# Patient Record
Sex: Female | Born: 1960 | Hispanic: No | Marital: Married | Smoking: Former smoker
Health system: Southern US, Community
[De-identification: ages and names within clinical notes are randomized; demographics above are authoritative.]

## PROBLEM LIST (undated history)

## (undated) DIAGNOSIS — Z9221 Personal history of antineoplastic chemotherapy: Secondary | ICD-10-CM

## (undated) DIAGNOSIS — R87612 Low grade squamous intraepithelial lesion on cytologic smear of cervix (LGSIL): Secondary | ICD-10-CM

## (undated) DIAGNOSIS — N390 Urinary tract infection, site not specified: Secondary | ICD-10-CM

## (undated) DIAGNOSIS — J302 Other seasonal allergic rhinitis: Secondary | ICD-10-CM

## (undated) DIAGNOSIS — I499 Cardiac arrhythmia, unspecified: Secondary | ICD-10-CM

## (undated) DIAGNOSIS — R7303 Prediabetes: Secondary | ICD-10-CM

## (undated) DIAGNOSIS — C50919 Malignant neoplasm of unspecified site of unspecified female breast: Secondary | ICD-10-CM

## (undated) DIAGNOSIS — E119 Type 2 diabetes mellitus without complications: Secondary | ICD-10-CM

## (undated) DIAGNOSIS — Z923 Personal history of irradiation: Secondary | ICD-10-CM

## (undated) DIAGNOSIS — E785 Hyperlipidemia, unspecified: Secondary | ICD-10-CM

## (undated) HISTORY — DX: Low grade squamous intraepithelial lesion on cytologic smear of cervix (LGSIL): R87.612

## (undated) HISTORY — PX: REDUCTION MAMMAPLASTY: SUR839

## (undated) HISTORY — DX: Urinary tract infection, site not specified: N39.0

## (undated) HISTORY — DX: Prediabetes: R73.03

## (undated) HISTORY — DX: Malignant neoplasm of unspecified site of unspecified female breast: C50.919

## (undated) HISTORY — DX: Hyperlipidemia, unspecified: E78.5

---

## 1993-04-10 HISTORY — PX: LEEP: SHX91

## 2001-01-08 DIAGNOSIS — C50919 Malignant neoplasm of unspecified site of unspecified female breast: Secondary | ICD-10-CM

## 2001-01-08 HISTORY — DX: Malignant neoplasm of unspecified site of unspecified female breast: C50.919

## 2002-01-08 DIAGNOSIS — C50911 Malignant neoplasm of unspecified site of right female breast: Secondary | ICD-10-CM | POA: Insufficient documentation

## 2002-07-09 HISTORY — PX: BREAST BIOPSY: SHX20

## 2002-08-09 HISTORY — PX: BREAST LUMPECTOMY: SHX2

## 2003-10-09 ENCOUNTER — Ambulatory Visit: Payer: Self-pay | Admitting: Oncology

## 2003-11-09 ENCOUNTER — Ambulatory Visit: Payer: Self-pay | Admitting: Oncology

## 2003-12-09 ENCOUNTER — Ambulatory Visit: Payer: Self-pay | Admitting: Oncology

## 2003-12-13 ENCOUNTER — Ambulatory Visit: Payer: Self-pay | Admitting: General Surgery

## 2004-01-09 ENCOUNTER — Ambulatory Visit: Payer: Self-pay | Admitting: Oncology

## 2004-02-09 ENCOUNTER — Ambulatory Visit: Payer: Self-pay | Admitting: Oncology

## 2004-03-08 ENCOUNTER — Ambulatory Visit: Payer: Self-pay | Admitting: Oncology

## 2004-04-08 ENCOUNTER — Ambulatory Visit: Payer: Self-pay | Admitting: Oncology

## 2004-05-08 ENCOUNTER — Ambulatory Visit: Payer: Self-pay | Admitting: Oncology

## 2004-06-08 ENCOUNTER — Ambulatory Visit: Payer: Self-pay | Admitting: Oncology

## 2004-06-12 ENCOUNTER — Ambulatory Visit: Payer: Self-pay | Admitting: General Surgery

## 2004-07-12 ENCOUNTER — Ambulatory Visit: Payer: Self-pay | Admitting: Oncology

## 2004-08-08 ENCOUNTER — Ambulatory Visit: Payer: Self-pay | Admitting: Oncology

## 2004-10-11 ENCOUNTER — Ambulatory Visit: Payer: Self-pay | Admitting: Oncology

## 2004-11-08 ENCOUNTER — Ambulatory Visit: Payer: Self-pay | Admitting: Oncology

## 2005-02-15 ENCOUNTER — Ambulatory Visit: Payer: Self-pay | Admitting: Oncology

## 2005-03-08 ENCOUNTER — Ambulatory Visit: Payer: Self-pay | Admitting: Oncology

## 2005-06-08 ENCOUNTER — Ambulatory Visit: Payer: Self-pay | Admitting: General Surgery

## 2005-06-15 ENCOUNTER — Ambulatory Visit: Payer: Self-pay | Admitting: Oncology

## 2005-07-08 ENCOUNTER — Ambulatory Visit: Payer: Self-pay | Admitting: Oncology

## 2005-10-12 ENCOUNTER — Ambulatory Visit: Payer: Self-pay | Admitting: Oncology

## 2005-11-08 ENCOUNTER — Ambulatory Visit: Payer: Self-pay | Admitting: Oncology

## 2006-02-08 ENCOUNTER — Ambulatory Visit: Payer: Self-pay | Admitting: Oncology

## 2006-03-09 ENCOUNTER — Ambulatory Visit: Payer: Self-pay | Admitting: Oncology

## 2006-06-20 ENCOUNTER — Ambulatory Visit: Payer: Self-pay | Admitting: Oncology

## 2006-08-07 ENCOUNTER — Ambulatory Visit: Payer: Self-pay | Admitting: Oncology

## 2006-08-09 ENCOUNTER — Ambulatory Visit: Payer: Self-pay | Admitting: Oncology

## 2007-01-09 ENCOUNTER — Ambulatory Visit: Payer: Self-pay | Admitting: Oncology

## 2007-02-06 ENCOUNTER — Ambulatory Visit: Payer: Self-pay | Admitting: Oncology

## 2007-02-09 ENCOUNTER — Ambulatory Visit: Payer: Self-pay | Admitting: Oncology

## 2007-03-09 ENCOUNTER — Ambulatory Visit: Payer: Self-pay | Admitting: Oncology

## 2007-05-09 ENCOUNTER — Ambulatory Visit: Payer: Self-pay | Admitting: Oncology

## 2007-06-09 ENCOUNTER — Ambulatory Visit: Payer: Self-pay | Admitting: Oncology

## 2007-06-23 ENCOUNTER — Ambulatory Visit: Payer: Self-pay | Admitting: Oncology

## 2007-11-09 ENCOUNTER — Ambulatory Visit: Payer: Self-pay | Admitting: Oncology

## 2007-11-19 ENCOUNTER — Ambulatory Visit: Payer: Self-pay | Admitting: Oncology

## 2007-12-09 ENCOUNTER — Ambulatory Visit: Payer: Self-pay | Admitting: Oncology

## 2008-01-09 HISTORY — PX: BREAST BIOPSY: SHX20

## 2008-03-08 ENCOUNTER — Ambulatory Visit: Payer: Self-pay | Admitting: Oncology

## 2008-04-07 ENCOUNTER — Ambulatory Visit: Payer: Self-pay | Admitting: Oncology

## 2008-04-08 ENCOUNTER — Ambulatory Visit: Payer: Self-pay | Admitting: Oncology

## 2008-06-29 ENCOUNTER — Ambulatory Visit: Payer: Self-pay | Admitting: Oncology

## 2008-06-30 ENCOUNTER — Ambulatory Visit: Payer: Self-pay | Admitting: Oncology

## 2008-07-08 ENCOUNTER — Ambulatory Visit: Payer: Self-pay | Admitting: Oncology

## 2008-07-09 ENCOUNTER — Ambulatory Visit: Payer: Self-pay | Admitting: Oncology

## 2008-07-21 ENCOUNTER — Ambulatory Visit: Payer: Self-pay | Admitting: General Surgery

## 2008-08-08 ENCOUNTER — Ambulatory Visit: Payer: Self-pay | Admitting: Oncology

## 2009-07-08 ENCOUNTER — Ambulatory Visit: Payer: Self-pay | Admitting: Oncology

## 2009-07-22 ENCOUNTER — Ambulatory Visit: Payer: Self-pay | Admitting: Oncology

## 2009-08-08 ENCOUNTER — Ambulatory Visit: Payer: Self-pay | Admitting: Oncology

## 2009-09-08 ENCOUNTER — Ambulatory Visit: Payer: Self-pay | Admitting: Oncology

## 2010-01-23 ENCOUNTER — Ambulatory Visit: Payer: Self-pay | Admitting: Oncology

## 2010-01-24 LAB — CANCER ANTIGEN 27.29: CA 27.29: 18.7 U/mL (ref 0.0–38.6)

## 2010-02-08 ENCOUNTER — Ambulatory Visit: Payer: Self-pay | Admitting: Oncology

## 2010-03-31 ENCOUNTER — Ambulatory Visit: Payer: Self-pay | Admitting: General Surgery

## 2010-03-31 HISTORY — PX: COLONOSCOPY: SHX174

## 2010-04-03 LAB — PATHOLOGY REPORT

## 2010-08-11 ENCOUNTER — Ambulatory Visit: Payer: Self-pay | Admitting: Oncology

## 2010-08-12 LAB — CANCER ANTIGEN 27.29: CA 27.29: 11.7 U/mL (ref 0.0–38.6)

## 2010-09-09 ENCOUNTER — Ambulatory Visit: Payer: Self-pay | Admitting: Oncology

## 2010-09-14 ENCOUNTER — Ambulatory Visit: Payer: Self-pay | Admitting: Oncology

## 2010-10-09 ENCOUNTER — Ambulatory Visit: Payer: Self-pay | Admitting: Oncology

## 2011-02-12 ENCOUNTER — Ambulatory Visit: Payer: Self-pay | Admitting: Oncology

## 2011-02-12 LAB — CBC CANCER CENTER
Basophil #: 0.1 x10 3/mm (ref 0.0–0.1)
Basophil %: 0.5 %
Eosinophil #: 0.1 x10 3/mm (ref 0.0–0.7)
Eosinophil %: 1 %
HCT: 41 % (ref 35.0–47.0)
HGB: 13.9 g/dL (ref 12.0–16.0)
Lymphocyte #: 2.2 x10 3/mm (ref 1.0–3.6)
Lymphocyte %: 23.6 %
MCH: 30.2 pg (ref 26.0–34.0)
MCHC: 34.1 g/dL (ref 32.0–36.0)
MCV: 89 fL (ref 80–100)
Monocyte #: 0.4 x10 3/mm (ref 0.0–0.7)
Monocyte %: 4 %
Neutrophil #: 6.6 x10 3/mm — ABNORMAL HIGH (ref 1.4–6.5)
Neutrophil %: 70.9 %
Platelet: 265 x10 3/mm (ref 150–440)
RBC: 4.63 10*6/uL (ref 3.80–5.20)
RDW: 14.3 % (ref 11.5–14.5)
WBC: 9.3 x10 3/mm (ref 3.6–11.0)

## 2011-02-12 LAB — COMPREHENSIVE METABOLIC PANEL
Albumin: 4 g/dL (ref 3.4–5.0)
Alkaline Phosphatase: 126 U/L (ref 50–136)
Anion Gap: 9 (ref 7–16)
BUN: 12 mg/dL (ref 7–18)
Bilirubin,Total: 0.3 mg/dL (ref 0.2–1.0)
Calcium, Total: 9.6 mg/dL (ref 8.5–10.1)
Chloride: 102 mmol/L (ref 98–107)
Co2: 30 mmol/L (ref 21–32)
Creatinine: 0.99 mg/dL (ref 0.60–1.30)
EGFR (African American): >60
EGFR (Non-African Amer.): >60
Glucose: 93 mg/dL (ref 65–99)
Osmolality: 281 (ref 275–301)
Potassium: 4 mmol/L (ref 3.5–5.1)
SGOT(AST): 26 U/L (ref 15–37)
SGPT (ALT): 45 U/L
Sodium: 141 mmol/L (ref 136–145)
Total Protein: 8.5 g/dL — ABNORMAL HIGH (ref 6.4–8.2)

## 2011-02-13 LAB — CANCER ANTIGEN 27.29: CA 27.29: 18.5 U/mL (ref 0.0–38.6)

## 2011-03-09 ENCOUNTER — Ambulatory Visit: Payer: Self-pay | Admitting: Oncology

## 2011-08-14 ENCOUNTER — Ambulatory Visit: Payer: Self-pay | Admitting: Oncology

## 2011-08-14 LAB — COMPREHENSIVE METABOLIC PANEL
Albumin: 3.8 g/dL (ref 3.4–5.0)
Alkaline Phosphatase: 118 U/L (ref 50–136)
Anion Gap: 7 (ref 7–16)
BUN: 12 mg/dL (ref 7–18)
Bilirubin,Total: 0.4 mg/dL (ref 0.2–1.0)
Calcium, Total: 9.7 mg/dL (ref 8.5–10.1)
Chloride: 102 mmol/L (ref 98–107)
Co2: 31 mmol/L (ref 21–32)
Creatinine: 1.01 mg/dL (ref 0.60–1.30)
EGFR (African American): >60
EGFR (Non-African Amer.): >60
Glucose: 137 mg/dL — ABNORMAL HIGH (ref 65–99)
Osmolality: 281 (ref 275–301)
Potassium: 3.9 mmol/L (ref 3.5–5.1)
SGOT(AST): 24 U/L (ref 15–37)
SGPT (ALT): 44 U/L (ref 12–78)
Sodium: 140 mmol/L (ref 136–145)
Total Protein: 8.2 g/dL (ref 6.4–8.2)

## 2011-08-14 LAB — CBC CANCER CENTER
Basophil #: 0.1 x10 3/mm (ref 0.0–0.1)
Basophil %: 0.7 %
Eosinophil #: 0.2 x10 3/mm (ref 0.0–0.7)
Eosinophil %: 1.7 %
HCT: 39.8 % (ref 35.0–47.0)
HGB: 13.4 g/dL (ref 12.0–16.0)
Lymphocyte #: 2.2 x10 3/mm (ref 1.0–3.6)
Lymphocyte %: 23.5 %
MCH: 30.2 pg (ref 26.0–34.0)
MCHC: 33.7 g/dL (ref 32.0–36.0)
MCV: 90 fL (ref 80–100)
Monocyte #: 0.4 x10 3/mm (ref 0.2–0.9)
Monocyte %: 4.8 %
Neutrophil #: 6.5 x10 3/mm (ref 1.4–6.5)
Neutrophil %: 69.3 %
Platelet: 275 x10 3/mm (ref 150–440)
RBC: 4.43 10*6/uL (ref 3.80–5.20)
RDW: 13.9 % (ref 11.5–14.5)
WBC: 9.4 x10 3/mm (ref 3.6–11.0)

## 2011-08-15 LAB — CANCER ANTIGEN 27.29: CA 27.29: 15.3 U/mL (ref 0.0–38.6)

## 2011-09-09 ENCOUNTER — Ambulatory Visit: Payer: Self-pay | Admitting: Oncology

## 2011-10-09 ENCOUNTER — Ambulatory Visit: Payer: Self-pay | Admitting: Oncology

## 2012-02-09 ENCOUNTER — Ambulatory Visit: Payer: Self-pay | Admitting: Oncology

## 2012-02-14 LAB — CBC CANCER CENTER
Basophil #: 0.1 x10 3/mm (ref 0.0–0.1)
Basophil %: 0.7 %
Eosinophil #: 0.2 x10 3/mm (ref 0.0–0.7)
Eosinophil %: 1.9 %
HCT: 40.9 % (ref 35.0–47.0)
HGB: 13.9 g/dL (ref 12.0–16.0)
Lymphocyte #: 2.1 x10 3/mm (ref 1.0–3.6)
Lymphocyte %: 23.4 %
MCH: 30.3 pg (ref 26.0–34.0)
MCHC: 33.9 g/dL (ref 32.0–36.0)
MCV: 89 fL (ref 80–100)
Monocyte #: 0.4 x10 3/mm (ref 0.2–0.9)
Monocyte %: 5 %
Neutrophil #: 6.1 x10 3/mm (ref 1.4–6.5)
Neutrophil %: 69 %
Platelet: 265 x10 3/mm (ref 150–440)
RBC: 4.58 10*6/uL (ref 3.80–5.20)
RDW: 13.6 % (ref 11.5–14.5)
WBC: 8.8 x10 3/mm (ref 3.6–11.0)

## 2012-02-14 LAB — COMPREHENSIVE METABOLIC PANEL
Albumin: 3.9 g/dL (ref 3.4–5.0)
Alkaline Phosphatase: 106 U/L (ref 50–136)
Anion Gap: 9 (ref 7–16)
BUN: 9 mg/dL (ref 7–18)
Bilirubin,Total: 0.3 mg/dL (ref 0.2–1.0)
Calcium, Total: 9.8 mg/dL (ref 8.5–10.1)
Chloride: 103 mmol/L (ref 98–107)
Co2: 30 mmol/L (ref 21–32)
Creatinine: 0.86 mg/dL (ref 0.60–1.30)
EGFR (African American): >60
EGFR (Non-African Amer.): >60
Glucose: 87 mg/dL (ref 65–99)
Osmolality: 281 (ref 275–301)
Potassium: 3.9 mmol/L (ref 3.5–5.1)
SGOT(AST): 19 U/L (ref 15–37)
SGPT (ALT): 33 U/L (ref 12–78)
Sodium: 142 mmol/L (ref 136–145)
Total Protein: 8.4 g/dL — ABNORMAL HIGH (ref 6.4–8.2)

## 2012-02-15 LAB — CANCER ANTIGEN 27.29: CA 27.29: 16.2 U/mL (ref 0.0–38.6)

## 2012-03-08 ENCOUNTER — Ambulatory Visit: Payer: Self-pay | Admitting: Oncology

## 2012-09-17 ENCOUNTER — Ambulatory Visit: Payer: Self-pay | Admitting: Oncology

## 2012-09-18 ENCOUNTER — Ambulatory Visit: Payer: Self-pay | Admitting: Oncology

## 2012-09-18 LAB — COMPREHENSIVE METABOLIC PANEL
Albumin: 3.9 g/dL (ref 3.4–5.0)
Alkaline Phosphatase: 113 U/L (ref 50–136)
Anion Gap: 12 (ref 7–16)
BUN: 12 mg/dL (ref 7–18)
Bilirubin,Total: 0.3 mg/dL (ref 0.2–1.0)
Calcium, Total: 10.1 mg/dL (ref 8.5–10.1)
Chloride: 101 mmol/L (ref 98–107)
Co2: 28 mmol/L (ref 21–32)
Creatinine: 1.05 mg/dL (ref 0.60–1.30)
EGFR (African American): >60
EGFR (Non-African Amer.): >60
Glucose: 170 mg/dL — ABNORMAL HIGH (ref 65–99)
Osmolality: 285 (ref 275–301)
Potassium: 4 mmol/L (ref 3.5–5.1)
SGOT(AST): 16 U/L (ref 15–37)
SGPT (ALT): 33 U/L (ref 12–78)
Sodium: 141 mmol/L (ref 136–145)
Total Protein: 8.2 g/dL (ref 6.4–8.2)

## 2012-09-18 LAB — CBC CANCER CENTER
Basophil #: 0.1 x10 3/mm (ref 0.0–0.1)
Basophil %: 0.6 %
Eosinophil #: 0.2 x10 3/mm (ref 0.0–0.7)
Eosinophil %: 2 %
HCT: 42.5 % (ref 35.0–47.0)
HGB: 14.2 g/dL (ref 12.0–16.0)
Lymphocyte #: 2 x10 3/mm (ref 1.0–3.6)
Lymphocyte %: 22.6 %
MCH: 29.7 pg (ref 26.0–34.0)
MCHC: 33.4 g/dL (ref 32.0–36.0)
MCV: 89 fL (ref 80–100)
Monocyte #: 0.5 x10 3/mm (ref 0.2–0.9)
Monocyte %: 5.6 %
Neutrophil #: 6.2 x10 3/mm (ref 1.4–6.5)
Neutrophil %: 69.2 %
Platelet: 288 x10 3/mm (ref 150–440)
RBC: 4.78 10*6/uL (ref 3.80–5.20)
RDW: 14 % (ref 11.5–14.5)
WBC: 9 x10 3/mm (ref 3.6–11.0)

## 2012-10-08 ENCOUNTER — Ambulatory Visit: Payer: Self-pay | Admitting: Oncology

## 2013-03-31 ENCOUNTER — Ambulatory Visit: Payer: Self-pay | Admitting: Oncology

## 2013-04-13 ENCOUNTER — Ambulatory Visit: Payer: Self-pay | Admitting: Oncology

## 2013-04-14 LAB — COMPREHENSIVE METABOLIC PANEL
Albumin: 3.9 g/dL (ref 3.4–5.0)
Alkaline Phosphatase: 94 U/L
Anion Gap: 14 (ref 7–16)
BUN: 15 mg/dL (ref 7–18)
Bilirubin,Total: 0.3 mg/dL (ref 0.2–1.0)
Calcium, Total: 9.8 mg/dL (ref 8.5–10.1)
Chloride: 101 mmol/L (ref 98–107)
Co2: 24 mmol/L (ref 21–32)
Creatinine: 1.11 mg/dL (ref 0.60–1.30)
EGFR (African American): >60
EGFR (Non-African Amer.): 57 — ABNORMAL LOW
Glucose: 197 mg/dL — ABNORMAL HIGH (ref 65–99)
Osmolality: 284 (ref 275–301)
Potassium: 3.6 mmol/L (ref 3.5–5.1)
SGOT(AST): 22 U/L (ref 15–37)
SGPT (ALT): 37 U/L (ref 12–78)
Sodium: 139 mmol/L (ref 136–145)
Total Protein: 8.4 g/dL — ABNORMAL HIGH (ref 6.4–8.2)

## 2013-04-14 LAB — CBC CANCER CENTER
Basophil #: 0.1 x10 3/mm (ref 0.0–0.1)
Basophil %: 0.7 %
Eosinophil #: 0.2 x10 3/mm (ref 0.0–0.7)
Eosinophil %: 1.9 %
HCT: 42.1 % (ref 35.0–47.0)
HGB: 13.7 g/dL (ref 12.0–16.0)
Lymphocyte #: 2.4 x10 3/mm (ref 1.0–3.6)
Lymphocyte %: 23 %
MCH: 29.4 pg (ref 26.0–34.0)
MCHC: 32.6 g/dL (ref 32.0–36.0)
MCV: 90 fL (ref 80–100)
Monocyte #: 0.4 x10 3/mm (ref 0.2–0.9)
Monocyte %: 4.1 %
Neutrophil #: 7.4 x10 3/mm — ABNORMAL HIGH (ref 1.4–6.5)
Neutrophil %: 70.3 %
Platelet: 293 x10 3/mm (ref 150–440)
RBC: 4.67 10*6/uL (ref 3.80–5.20)
RDW: 14.1 % (ref 11.5–14.5)
WBC: 10.5 x10 3/mm (ref 3.6–11.0)

## 2013-04-16 LAB — CANCER ANTIGEN 27.29: CA 27.29: 16 U/mL (ref 0.0–38.6)

## 2013-05-08 ENCOUNTER — Ambulatory Visit: Payer: Self-pay | Admitting: Oncology

## 2013-06-08 ENCOUNTER — Ambulatory Visit: Payer: Self-pay | Admitting: Oncology

## 2013-09-18 ENCOUNTER — Ambulatory Visit: Payer: Self-pay | Admitting: Oncology

## 2013-11-23 ENCOUNTER — Ambulatory Visit: Payer: Self-pay | Admitting: Oncology

## 2013-11-23 LAB — COMPREHENSIVE METABOLIC PANEL
Albumin: 3.8 g/dL (ref 3.4–5.0)
Alkaline Phosphatase: 104 U/L
Anion Gap: 11 (ref 7–16)
BUN: 10 mg/dL (ref 7–18)
Bilirubin,Total: 0.3 mg/dL (ref 0.2–1.0)
Calcium, Total: 9.6 mg/dL (ref 8.5–10.1)
Chloride: 106 mmol/L (ref 98–107)
Co2: 24 mmol/L (ref 21–32)
Creatinine: 0.91 mg/dL (ref 0.60–1.30)
EGFR (African American): >60
EGFR (Non-African Amer.): >60
Glucose: 92 mg/dL (ref 65–99)
Osmolality: 280 (ref 275–301)
Potassium: 3.6 mmol/L (ref 3.5–5.1)
SGOT(AST): 17 U/L (ref 15–37)
SGPT (ALT): 38 U/L
Sodium: 141 mmol/L (ref 136–145)
Total Protein: 7.9 g/dL (ref 6.4–8.2)

## 2013-11-23 LAB — CBC CANCER CENTER
Basophil #: 0.1 x10 3/mm (ref 0.0–0.1)
Basophil %: 0.6 %
Eosinophil #: 0.2 x10 3/mm (ref 0.0–0.7)
Eosinophil %: 1.3 %
HCT: 41.6 % (ref 35.0–47.0)
HGB: 13.7 g/dL (ref 12.0–16.0)
Lymphocyte #: 2.7 x10 3/mm (ref 1.0–3.6)
Lymphocyte %: 23.4 %
MCH: 29.9 pg (ref 26.0–34.0)
MCHC: 33 g/dL (ref 32.0–36.0)
MCV: 91 fL (ref 80–100)
Monocyte #: 0.6 x10 3/mm (ref 0.2–0.9)
Monocyte %: 4.9 %
Neutrophil #: 8 x10 3/mm — ABNORMAL HIGH (ref 1.4–6.5)
Neutrophil %: 69.8 %
Platelet: 290 x10 3/mm (ref 150–440)
RBC: 4.59 10*6/uL (ref 3.80–5.20)
RDW: 14.2 % (ref 11.5–14.5)
WBC: 11.4 x10 3/mm — ABNORMAL HIGH (ref 3.6–11.0)

## 2013-11-23 LAB — CREATININE, SERUM: Creatine, Serum: 0.91

## 2013-11-25 LAB — CANCER ANTIGEN 27.29: CA 27.29: 9.2 U/mL (ref 0.0–38.6)

## 2013-12-08 ENCOUNTER — Ambulatory Visit: Payer: Self-pay | Admitting: Oncology

## 2014-04-23 ENCOUNTER — Other Ambulatory Visit: Payer: Self-pay | Admitting: Oncology

## 2014-04-23 DIAGNOSIS — Z1231 Encounter for screening mammogram for malignant neoplasm of breast: Secondary | ICD-10-CM

## 2014-05-21 ENCOUNTER — Other Ambulatory Visit: Payer: Self-pay | Admitting: *Deleted

## 2014-05-21 DIAGNOSIS — C50919 Malignant neoplasm of unspecified site of unspecified female breast: Secondary | ICD-10-CM

## 2014-05-27 ENCOUNTER — Ambulatory Visit: Payer: Self-pay | Admitting: Oncology

## 2014-05-27 ENCOUNTER — Other Ambulatory Visit: Payer: Self-pay

## 2014-06-30 ENCOUNTER — Ambulatory Visit: Payer: Self-pay | Admitting: Oncology

## 2014-06-30 ENCOUNTER — Other Ambulatory Visit: Payer: Self-pay

## 2014-07-28 ENCOUNTER — Inpatient Hospital Stay: Payer: BLUE CROSS/BLUE SHIELD | Admitting: Oncology

## 2014-07-28 ENCOUNTER — Inpatient Hospital Stay: Payer: BLUE CROSS/BLUE SHIELD

## 2014-09-20 ENCOUNTER — Ambulatory Visit
Admission: RE | Admit: 2014-09-20 | Discharge: 2014-09-20 | Disposition: A | Discharge status: Home / Self Care | Payer: 59 | Source: Ambulatory Visit | Attending: Oncology | Admitting: Oncology

## 2014-09-20 DIAGNOSIS — Z853 Personal history of malignant neoplasm of breast: Secondary | ICD-10-CM | POA: Diagnosis not present

## 2014-09-20 DIAGNOSIS — Z1231 Encounter for screening mammogram for malignant neoplasm of breast: Secondary | ICD-10-CM | POA: Diagnosis not present

## 2014-09-22 ENCOUNTER — Encounter: Payer: Self-pay | Admitting: Oncology

## 2014-09-22 ENCOUNTER — Inpatient Hospital Stay (HOSPITAL_BASED_OUTPATIENT_CLINIC_OR_DEPARTMENT_OTHER): Payer: 59 | Admitting: Oncology

## 2014-09-22 ENCOUNTER — Inpatient Hospital Stay: Payer: 59 | Attending: Oncology

## 2014-09-22 ENCOUNTER — Encounter (INDEPENDENT_AMBULATORY_CARE_PROVIDER_SITE_OTHER): Payer: Self-pay

## 2014-09-22 VITALS — BP 131/89 | HR 123 | Temp 98.4°F | Wt 185.2 lb

## 2014-09-22 DIAGNOSIS — Z9221 Personal history of antineoplastic chemotherapy: Secondary | ICD-10-CM

## 2014-09-22 DIAGNOSIS — Z803 Family history of malignant neoplasm of breast: Secondary | ICD-10-CM

## 2014-09-22 DIAGNOSIS — Z87891 Personal history of nicotine dependence: Secondary | ICD-10-CM | POA: Diagnosis not present

## 2014-09-22 DIAGNOSIS — Z923 Personal history of irradiation: Secondary | ICD-10-CM

## 2014-09-22 DIAGNOSIS — Z853 Personal history of malignant neoplasm of breast: Secondary | ICD-10-CM | POA: Insufficient documentation

## 2014-09-22 DIAGNOSIS — C50919 Malignant neoplasm of unspecified site of unspecified female breast: Secondary | ICD-10-CM

## 2014-09-22 LAB — CBC WITH DIFFERENTIAL/PLATELET
Basophils Absolute: 0 10*3/uL (ref 0–0.1)
Basophils Relative: 1 %
Eosinophils Absolute: 0.1 10*3/uL (ref 0–0.7)
Eosinophils Relative: 2 %
HCT: 41.6 % (ref 35.0–47.0)
Hemoglobin: 13.6 g/dL (ref 12.0–16.0)
Lymphocytes Relative: 22 %
Lymphs Abs: 1.8 10*3/uL (ref 1.0–3.6)
MCH: 29.1 pg (ref 26.0–34.0)
MCHC: 32.7 g/dL (ref 32.0–36.0)
MCV: 88.9 fL (ref 80.0–100.0)
Monocytes Absolute: 0.4 10*3/uL (ref 0.2–0.9)
Monocytes Relative: 5 %
Neutro Abs: 5.8 10*3/uL (ref 1.4–6.5)
Neutrophils Relative %: 70 %
Platelets: 282 10*3/uL (ref 150–440)
RBC: 4.68 MIL/uL (ref 3.80–5.20)
RDW: 14.2 % (ref 11.5–14.5)
WBC: 8.3 10*3/uL (ref 3.6–11.0)

## 2014-09-22 LAB — COMPREHENSIVE METABOLIC PANEL
ALT: 35 U/L (ref 14–54)
AST: 33 U/L (ref 15–41)
Albumin: 4.2 g/dL (ref 3.5–5.0)
Alkaline Phosphatase: 80 U/L (ref 38–126)
Anion gap: 9 (ref 5–15)
BUN: 11 mg/dL (ref 6–20)
CO2: 29 mmol/L (ref 22–32)
Calcium: 10 mg/dL (ref 8.9–10.3)
Chloride: 103 mmol/L (ref 101–111)
Creatinine, Ser: 0.87 mg/dL (ref 0.44–1.00)
GFR calc Af Amer: >60 mL/min (ref >60)
GFR calc non Af Amer: >60 mL/min (ref >60)
Glucose, Bld: 108 mg/dL — ABNORMAL HIGH (ref 65–99)
Potassium: 3.8 mmol/L (ref 3.5–5.1)
Sodium: 141 mmol/L (ref 135–145)
Total Bilirubin: 0.7 mg/dL (ref 0.3–1.2)
Total Protein: 8.3 g/dL — ABNORMAL HIGH (ref 6.5–8.1)

## 2014-09-22 LAB — IRON AND TIBC
Iron: 61 ug/dL (ref 28–170)
Saturation Ratios: 17 % (ref 10.4–31.8)
TIBC: 355 ug/dL (ref 250–450)
UIBC: 294 ug/dL

## 2014-09-22 LAB — TSH: TSH: 1.153 u[IU]/mL (ref 0.350–4.500)

## 2014-09-22 LAB — FERRITIN: Ferritin: 88 ng/mL (ref 11–307)

## 2014-09-22 NOTE — Progress Notes (Signed)
[place] @ [facility] Telephone:(336) [phone]  Fax:(336) [place]: 05-01-1960  MR#: [id number]  JYN#:[id number]  Patient Care Team: No Pcp Per Patient as PCP - General (General Practice)  CHIEF COMPLAINT: No chief complaint on file.  Chief Complaint/Diagnosis:   1. Carcinoma of right breast diagnosed July 2004.  ER PR positive.  HER-2 3+. 2. Neoadjuvant chemotherapy with Cytoxan Adriamycin and Taxol. Lumpectomy followed by radiation in March of 2005.  3. Started on tamoxifen in February 2005. 4. Received Herceptin from May 2005 to May 2006. 5. Finished tamoxifen in no February of 2010 6. Started on extended adjuvant therapy with Lupron and Femara from March of 2010 7. Patient is on Femara.   Femara has been discontinued in April of 2015 8.BRCA mutation negative (April, 2015 )  No history exists.   INTERVAL HISTORY: 54 year old lady came today further follow-up regarding carcinoma breast. Patient got mammogram done a few days ago which has been reported to be BI-RADS 2. Patient is off letrozole. No abdominal pain no nausea no vomiting.  REVIEW OF SYSTEMS:   GENERAL:  Feels good.  Active.  No fevers, sweats or weight loss. PERFORMANCE STATUS (ECOG):0 HEENT:  No visual changes, runny nose, sore throat, mouth sores or tenderness. Lungs: No shortness of breath or cough.  No hemoptysis. Cardiac:  No chest pain, palpitations, orthopnea, or PND. GI:  No nausea, vomiting, diarrhea, constipation, melena or hematochezia. GU:  No urgency, frequency, dysuria, or hematuria. Musculoskeletal:  No back pain.  No joint pain.  No muscle tenderness. Extremities:  No pain or swelling. Skin:  No rashes or skin changes. Neuro:  No headache, numbness or weakness, balance or coordination issues. Endocrine:  No diabetes, thyroid issues, hot flashes or night sweats. Psych:  No mood changes, depression or anxiety. Pain:  No focal pain. Review of systems:  All other systems  reviewed and found to be negative. As per HPI. Otherwise, a complete review of systems is negatve.  PAST MEDICAL HISTORY: Past Medical History  Diagnosis Date  . Breast cancer 2003    chemo/ radiation    PAST SURGICAL HISTORY: Past Surgical History  Procedure Laterality Date  . Breast biopsy Right     Last Mammography: August 2016   Smoking History: Smoking History quit smoking in year 2000.  PFSH: Comments: breast cancer and 2 great aunts on her maternal side.  No other significant family history  Comments: patient used to smoke but quit in 2000.  No alcohol use.  Additional Past Medical and Surgical History: no significant medical or surgical history   ADVANCED DIRECTIVESPatient does not have any living will or healthcare power of attorney.  Information was given .  Available resources had been discussed.  We will follow-up on subsequent appointments regarding this issue HEALTH MAINTENANCE: Social History  Substance Use Topics  . Smoking status: Not on file  . Smokeless tobacco: Not on file  . Alcohol Use: Not on file      Allergies  Allergen Reactions  . Sulfa Antibiotics Other (See Comments)    Current Outpatient Prescriptions  Medication Sig Dispense Refill  . metaxalone (SKELAXIN) 400 MG tablet TAKE 1 TABLET BY MOUTH EVERY 6 HOURS AS NEEDED FOR MUSCLE RELAXATION  0   No current facility-administered medications for this visit.    OBJECTIVE:  Filed Vitals:   09/22/14 1104  BP: 131/89  Pulse: 123  Temp: 98.4 F (36.9 C)     Body mass index is  28.16 kg/(m^2).    ECOG FS:0 - Asymptomatic  PHYSICAL EXAM: GENERAL:  Well developed, well nourished, sitting comfortably in the exam room in no acute distress. MENTAL STATUS:  Alert and oriented to person, place and time.  ENT:  Oropharynx clear without lesion.  Tongue normal. Mucous membranes moist.  RESPIRATORY:  Clear to auscultation without rales, wheezes or rhonchi. CARDIOVASCULAR:  Regular rate and  rhythm without murmur, rub or gallop.  Tachycardic BREAST:  Right breast without masses, skin changes or nipple discharge.  Left breast without masses, skin changes or nipple discharge. ABDOMEN:  Soft, non-tender, with active bowel sounds, and no hepatosplenomegaly.  No masses. BACK:  No CVA tenderness.  No tenderness on percussion of the back or rib cage. SKIN:  No rashes, ulcers or lesions. EXTREMITIES: No edema, no skin discoloration or tenderness.  No palpable cords. LYMPH NODES: No palpable cervical, supraclavicular, axillary or inguinal adenopathy  NEUROLOGICAL: Unremarkable. PSYCH:  Appropriate.   LAB RESULTS:  CBC Latest Ref Rng 09/22/2014 11/23/2013  WBC 3.6 - 11.0 K/uL 8.3 11.4(H)  Hemoglobin 12.0 - 16.0 g/dL 13.6 13.7  Hematocrit 35.0 - 47.0 % 41.6 41.6  Platelets 150 - 440 K/uL 282 290    Appointment on 09/22/2014  Component Date Value Ref Range Status  . WBC 09/22/2014 8.3  3.6 - 11.0 K/uL Final  . RBC 09/22/2014 4.68  3.80 - 5.20 MIL/uL Final  . Hemoglobin 09/22/2014 13.6  12.0 - 16.0 g/dL Final  . HCT 09/22/2014 41.6  35.0 - 47.0 % Final  . MCV 09/22/2014 88.9  80.0 - 100.0 fL Final  . MCH 09/22/2014 29.1  26.0 - 34.0 pg Final  . MCHC 09/22/2014 32.7  32.0 - 36.0 g/dL Final  . RDW 09/22/2014 14.2  11.5 - 14.5 % Final  . Platelets 09/22/2014 282  150 - 440 K/uL Final  . Neutrophils Relative % 09/22/2014 70   Final  . Neutro Abs 09/22/2014 5.8  1.4 - 6.5 K/uL Final  . Lymphocytes Relative 09/22/2014 22   Final  . Lymphs Abs 09/22/2014 1.8  1.0 - 3.6 K/uL Final  . Monocytes Relative 09/22/2014 5   Final  . Monocytes Absolute 09/22/2014 0.4  0.2 - 0.9 K/uL Final  . Eosinophils Relative 09/22/2014 2   Final  . Eosinophils Absolute 09/22/2014 0.1  0 - 0.7 K/uL Final  . Basophils Relative 09/22/2014 1   Final  . Basophils Absolute 09/22/2014 0.0  0 - 0.1 K/uL Final  . Sodium 09/22/2014 141  135 - 145 mmol/L Final  . Potassium 09/22/2014 3.8  3.5 - 5.1 mmol/L Final  .  Chloride 09/22/2014 103  101 - 111 mmol/L Final  . CO2 09/22/2014 29  22 - 32 mmol/L Final  . Glucose, Bld 09/22/2014 108* 65 - 99 mg/dL Final  . BUN 09/22/2014 11  6 - 20 mg/dL Final  . Creatinine, Ser 09/22/2014 0.87  0.44 - 1.00 mg/dL Final  . Calcium 09/22/2014 10.0  8.9 - 10.3 mg/dL Final  . Total Protein 09/22/2014 8.3* 6.5 - 8.1 g/dL Final  . Albumin 09/22/2014 4.2  3.5 - 5.0 g/dL Final  . AST 09/22/2014 33  15 - 41 U/L Final  . ALT 09/22/2014 35  14 - 54 U/L Final  . Alkaline Phosphatase 09/22/2014 80  38 - 126 U/L Final  . Total Bilirubin 09/22/2014 0.7  0.3 - 1.2 mg/dL Final  . GFR calc non Af Amer 09/22/2014 >60  >60 mL/min Final  . GFR calc Af  Amer 09/22/2014 >60  >60 mL/min Final   Comment: (NOTE) The eGFR has been calculated using the CKD EPI equation. This calculation has not been validated in all clinical situations. eGFR's persistently <60 mL/min signify possible Chronic Kidney Disease.   . Anion gap 09/22/2014 9  5 - 15 Final       STUDIES: Mm Digital Screening Bilateral  09/20/2014   CLINICAL DATA:  Screening. History of treated right breast cancer, status post lumpectomy.  EXAM: DIGITAL SCREENING BILATERAL MAMMOGRAM WITH CAD  COMPARISON:  Previous exam(s).  ACR Breast Density Category b: There are scattered areas of fibroglandular density.  FINDINGS: There are no findings suspicious for malignancy. Images were processed with CAD.  IMPRESSION: No mammographic evidence of malignancy. A result letter of this screening mammogram will be mailed directly to the patient.  RECOMMENDATION: Screening mammogram in one year. (Code:SM-B-01Y)  BI-RADS CATEGORY  2: Benign.   Electronically Signed   By: Fidela Salisbury M.D.   On: 09/20/2014 19:01    ASSESSMENT: Carcinoma breast No evidence of recurrent or progressive disease Mammogram on September 20, 2014 BI-RADS 2 All lab data has been reviewed  bEcause of tachycardia T4 TSH has been recommended according to patient  patient had been previously investigated by cardiologist.  Patient   COUSIN from the mother's side has been diagnosed with hemochromatosis Will check ferritin and iron iron-binding capacity MEDICAL DECISION MAKING:  Return appointment in 12 months Patient was advised to find primary care physician a regular checkup  Patient expressed understanding and was in agreement with this plan. She also understands that She can call clinic at any time with any questions, concerns, or complaints.    No matching staging information was found for the patient.  Forest Gleason, MD   09/22/2014 11:28 AM

## 2014-09-23 LAB — T4: T4, Total: 9.3 ug/dL (ref 4.5–12.0)

## 2014-09-23 LAB — CANCER ANTIGEN 27.29: CA 27.29: 14.3 U/mL (ref 0.0–38.6)

## 2015-09-30 ENCOUNTER — Ambulatory Visit
Admission: RE | Admit: 2015-09-30 | Discharge: 2015-09-30 | Disposition: A | Discharge status: Home / Self Care | Payer: 59 | Source: Ambulatory Visit | Attending: Oncology | Admitting: Oncology

## 2015-09-30 DIAGNOSIS — C50919 Malignant neoplasm of unspecified site of unspecified female breast: Secondary | ICD-10-CM

## 2015-09-30 DIAGNOSIS — Z1231 Encounter for screening mammogram for malignant neoplasm of breast: Secondary | ICD-10-CM | POA: Insufficient documentation

## 2015-10-06 ENCOUNTER — Inpatient Hospital Stay: Payer: 59

## 2015-10-06 ENCOUNTER — Ambulatory Visit: Payer: 59 | Admitting: Oncology

## 2015-10-19 ENCOUNTER — Inpatient Hospital Stay: Payer: 59

## 2015-10-19 ENCOUNTER — Other Ambulatory Visit: Payer: Self-pay | Admitting: *Deleted

## 2015-10-19 ENCOUNTER — Inpatient Hospital Stay: Payer: 59 | Attending: Internal Medicine | Admitting: Internal Medicine

## 2015-10-19 VITALS — BP 131/87 | HR 117 | Temp 97.6°F | Resp 18 | Wt 179.1 lb

## 2015-10-19 DIAGNOSIS — Z803 Family history of malignant neoplasm of breast: Secondary | ICD-10-CM | POA: Diagnosis not present

## 2015-10-19 DIAGNOSIS — Z9221 Personal history of antineoplastic chemotherapy: Secondary | ICD-10-CM | POA: Insufficient documentation

## 2015-10-19 DIAGNOSIS — R000 Tachycardia, unspecified: Secondary | ICD-10-CM

## 2015-10-19 DIAGNOSIS — Z853 Personal history of malignant neoplasm of breast: Secondary | ICD-10-CM | POA: Diagnosis not present

## 2015-10-19 DIAGNOSIS — D0591 Unspecified type of carcinoma in situ of right breast: Secondary | ICD-10-CM

## 2015-10-19 DIAGNOSIS — Z87891 Personal history of nicotine dependence: Secondary | ICD-10-CM | POA: Insufficient documentation

## 2015-10-19 DIAGNOSIS — Z923 Personal history of irradiation: Secondary | ICD-10-CM | POA: Diagnosis not present

## 2015-10-19 DIAGNOSIS — D508 Other iron deficiency anemias: Secondary | ICD-10-CM

## 2015-10-19 LAB — COMPREHENSIVE METABOLIC PANEL
ALT: 27 U/L (ref 14–54)
AST: 23 U/L (ref 15–41)
Albumin: 4.4 g/dL (ref 3.5–5.0)
Alkaline Phosphatase: 76 U/L (ref 38–126)
Anion gap: 10 (ref 5–15)
BUN: 16 mg/dL (ref 6–20)
CO2: 23 mmol/L (ref 22–32)
Calcium: 9.6 mg/dL (ref 8.9–10.3)
Chloride: 105 mmol/L (ref 101–111)
Creatinine, Ser: 0.88 mg/dL (ref 0.44–1.00)
GFR calc Af Amer: >60 mL/min (ref >60)
GFR calc non Af Amer: >60 mL/min (ref >60)
Glucose, Bld: 103 mg/dL — ABNORMAL HIGH (ref 65–99)
Potassium: 3.8 mmol/L (ref 3.5–5.1)
Sodium: 138 mmol/L (ref 135–145)
Total Bilirubin: 0.5 mg/dL (ref 0.3–1.2)
Total Protein: 8.4 g/dL — ABNORMAL HIGH (ref 6.5–8.1)

## 2015-10-19 LAB — CBC WITH DIFFERENTIAL/PLATELET
Basophils Absolute: 0.1 10*3/uL (ref 0–0.1)
Basophils Relative: 1 %
Eosinophils Absolute: 0.1 10*3/uL (ref 0–0.7)
Eosinophils Relative: 2 %
HCT: 41.5 % (ref 35.0–47.0)
Hemoglobin: 14 g/dL (ref 12.0–16.0)
Lymphocytes Relative: 22 %
Lymphs Abs: 1.9 10*3/uL (ref 1.0–3.6)
MCH: 29.1 pg (ref 26.0–34.0)
MCHC: 33.8 g/dL (ref 32.0–36.0)
MCV: 86.2 fL (ref 80.0–100.0)
Monocytes Absolute: 0.5 10*3/uL (ref 0.2–0.9)
Monocytes Relative: 5 %
Neutro Abs: 6.2 10*3/uL (ref 1.4–6.5)
Neutrophils Relative %: 70 %
Platelets: 310 10*3/uL (ref 150–440)
RBC: 4.81 MIL/uL (ref 3.80–5.20)
RDW: 14.2 % (ref 11.5–14.5)
WBC: 8.8 10*3/uL (ref 3.6–11.0)

## 2015-10-19 LAB — FERRITIN: Ferritin: 125 ng/mL (ref 11–307)

## 2015-10-19 LAB — IRON AND TIBC
Iron: 55 ug/dL (ref 28–170)
Saturation Ratios: 18 % (ref 10.4–31.8)
TIBC: 300 ug/dL (ref 250–450)
UIBC: 245 ug/dL

## 2015-10-19 NOTE — Progress Notes (Signed)
Patient is here for follow up, declines flu shot. She has a rapid heart rate when coming here

## 2015-10-20 NOTE — Progress Notes (Signed)
[place] @ [facility] [phone]) [phone]  Fax:(336) [place]: [birth year]  MR#: [id number]  WGN#:[id number]  Patient Care Team: No Pcp Per Patient as PCP - General (General Practice)  CHIEF COMPLAINT:  Chief Complaint  Patient presents with  . Breast Cancer     No history exists.   INTERVAL HISTORY: 55 year old lady came today further follow-up regarding carcinoma breast. Patient got mammogram done a few days ago which has been reported to be BI-RADS 2. Patient is off letrozole. No abdominal pain no nausea no vomiting.  REVIEW OF SYSTEMS:   GENERAL:  Feels good.  Active.  No fevers, sweats or weight loss. PERFORMANCE STATUS (ECOG):0 HEENT:  No visual changes, runny nose, sore throat, mouth sores or tenderness. Lungs: No shortness of breath or cough.  No hemoptysis. Cardiac:  No chest pain, palpitations, orthopnea, or PND. GI:  No nausea, vomiting, diarrhea, constipation, melena or hematochezia. GU:  No urgency, frequency, dysuria, or hematuria. Musculoskeletal:  No back pain.  No joint pain.  No muscle tenderness. Extremities:  No pain or swelling. Skin:  No rashes or skin changes. Neuro:  No headache, numbness or weakness, balance or coordination issues. Endocrine:  No diabetes, thyroid issues, hot flashes or night sweats. Psych:  No mood changes, depression or anxiety. Pain:  No focal pain. Review of systems:  All other systems reviewed and found to be negative. As per HPI. Otherwise, a complete review of systems is negatve.  PAST MEDICAL HISTORY: Past Medical History:  Diagnosis Date  . Breast cancer ([place]) 2003   chemo/ radiation    PAST SURGICAL HISTORY: Past Surgical History:  Procedure Laterality Date  . BREAST BIOPSY Right 2003   positive    Last Mammography: August 2016   Smoking History: Smoking History quit smoking in year 2000.  PFSH: Comments: breast cancer and 2 great aunts on her maternal side.  No other significant  family history  Comments: patient used to smoke but quit in 2000.  No alcohol use.  Additional Past Medical and Surgical History: no significant medical or surgical history   ADVANCED DIRECTIVESPatient does not have any living will or healthcare power of attorney.  Information was given .  Available resources had been discussed.  We will follow-up on subsequent appointments regarding this issue HEALTH MAINTENANCE: Social History  Substance Use Topics  . Smoking status: Never Smoker  . Smokeless tobacco: Not on file  . Alcohol use Not on file      Allergies  Allergen Reactions  . Sulfa Antibiotics Rash    Current Outpatient Prescriptions  Medication Sig Dispense Refill  . Omega-3 Fatty Acids (OMEGA-3 FISH OIL PO) Take by mouth.     No current facility-administered medications for this visit.     OBJECTIVE:  Vitals:   10/19/15 1041  BP: 131/87  Pulse: (!) 117  Resp: 18  Temp: 97.6 F (36.4 C)     Body mass index is 27.24 kg/m.    ECOG FS:0 - Asymptomatic  PHYSICAL EXAM: GENERAL:  Well developed, well nourished, sitting comfortably in the exam room in no acute distress. MENTAL STATUS:  Alert and oriented to person, place and time.  ENT:  Oropharynx clear without lesion.  Tongue normal. Mucous membranes moist.  RESPIRATORY:  Clear to auscultation without rales, wheezes or rhonchi. CARDIOVASCULAR:  Regular rate and rhythm without murmur, rub or gallop.  Tachycardic BREAST:  Right breast without masses, skin changes or nipple discharge.  Left breast without  masses, skin changes or nipple discharge. ABDOMEN:  Soft, non-tender, with active bowel sounds, and no hepatosplenomegaly.  No masses. BACK:  No CVA tenderness.  No tenderness on percussion of the back or rib cage. SKIN:  No rashes, ulcers or lesions. EXTREMITIES: No edema, no skin discoloration or tenderness.  No palpable cords. LYMPH NODES: No palpable cervical, supraclavicular, axillary or inguinal adenopathy    NEUROLOGICAL: Unremarkable. PSYCH:  Appropriate.   LAB RESULTS:  CBC Latest Ref Rng & Units 10/19/2015 09/22/2014  WBC 3.6 - 11.0 K/uL 8.8 8.3  Hemoglobin 12.0 - 16.0 g/dL 14.0 13.6  Hematocrit 35.0 - 47.0 % 41.5 41.6  Platelets 150 - 440 K/uL 310 282    Orders Only on 10/19/2015  Component Date Value Ref Range Status  . WBC 10/19/2015 8.8  3.6 - 11.0 K/uL Final  . RBC 10/19/2015 4.81  3.80 - 5.20 MIL/uL Final  . Hemoglobin 10/19/2015 14.0  12.0 - 16.0 g/dL Final  . HCT 10/19/2015 41.5  35.0 - 47.0 % Final  . MCV 10/19/2015 86.2  80.0 - 100.0 fL Final  . MCH 10/19/2015 29.1  26.0 - 34.0 pg Final  . MCHC 10/19/2015 33.8  32.0 - 36.0 g/dL Final  . RDW 10/19/2015 14.2  11.5 - 14.5 % Final  . Platelets 10/19/2015 310  150 - 440 K/uL Final  . Neutrophils Relative % 10/19/2015 70  % Final  . Neutro Abs 10/19/2015 6.2  1.4 - 6.5 K/uL Final  . Lymphocytes Relative 10/19/2015 22  % Final  . Lymphs Abs 10/19/2015 1.9  1.0 - 3.6 K/uL Final  . Monocytes Relative 10/19/2015 5  % Final  . Monocytes Absolute 10/19/2015 0.5  0.2 - 0.9 K/uL Final  . Eosinophils Relative 10/19/2015 2  % Final  . Eosinophils Absolute 10/19/2015 0.1  0 - 0.7 K/uL Final  . Basophils Relative 10/19/2015 1  % Final  . Basophils Absolute 10/19/2015 0.1  0 - 0.1 K/uL Final  . Sodium 10/19/2015 138  135 - 145 mmol/L Final  . Potassium 10/19/2015 3.8  3.5 - 5.1 mmol/L Final  . Chloride 10/19/2015 105  101 - 111 mmol/L Final  . CO2 10/19/2015 23  22 - 32 mmol/L Final  . Glucose, Bld 10/19/2015 103* 65 - 99 mg/dL Final  . BUN 10/19/2015 16  6 - 20 mg/dL Final  . Creatinine, Ser 10/19/2015 0.88  0.44 - 1.00 mg/dL Final  . Calcium 10/19/2015 9.6  8.9 - 10.3 mg/dL Final  . Total Protein 10/19/2015 8.4* 6.5 - 8.1 g/dL Final  . Albumin 10/19/2015 4.4  3.5 - 5.0 g/dL Final  . AST 10/19/2015 23  15 - 41 U/L Final  . ALT 10/19/2015 27  14 - 54 U/L Final  . Alkaline Phosphatase 10/19/2015 76  38 - 126 U/L Final  . Total  Bilirubin 10/19/2015 0.5  0.3 - 1.2 mg/dL Final  . GFR calc non Af Amer 10/19/2015 >60  >60 mL/min Final  . GFR calc Af Amer 10/19/2015 >60  >60 mL/min Final   Comment: (NOTE) The eGFR has been calculated using the CKD EPI equation. This calculation has not been validated in all clinical situations. eGFR's persistently <60 mL/min signify possible Chronic Kidney Disease.   . Anion gap 10/19/2015 10  5 - 15 Final  . Ferritin 10/19/2015 125  11 - 307 ng/mL Final  . Iron 10/19/2015 55  28 - 170 ug/dL Final  . TIBC 10/19/2015 300  250 - 450 ug/dL Final  .  Saturation Ratios 10/19/2015 18  10.4 - 31.8 % Final  . UIBC 10/19/2015 245  ug/dL Final       STUDIES: Mm Screening Breast Tomo Bilateral  Result Date: 09/30/2015 CLINICAL DATA:  Screening. EXAM: 2D DIGITAL SCREENING BILATERAL MAMMOGRAM WITH CAD AND ADJUNCT TOMO COMPARISON:  None. ACR Breast Density Category a: The breast tissue is almost entirely fatty. FINDINGS: There are no findings suspicious for malignancy. Stable postlumpectomy changes on the right. Images were processed with CAD. IMPRESSION: No mammographic evidence of malignancy. A result letter of this screening mammogram will be mailed directly to the patient. RECOMMENDATION: Screening mammogram in one year. (Code:SM-B-01Y) BI-RADS CATEGORY  2: Benign. Electronically Signed   By: Lajean Manes M.D.   On: 09/30/2015 12:38    ASSESSMENT: Carcinoma breast No evidence of recurrent or progressive disease Mammogram on September 20, 2014 BI-RADS 2 All lab data has been reviewed  bEcause of tachycardia T4 TSH has been recommended according to patient patient had been previously investigated by cardiologist.  Patient   COUSIN from the mother's side has been diagnosed with hemochromatosis Will check ferritin and iron iron-binding capacity MEDICAL DECISION MAKING:  Return appointment in 12 months Patient was advised to find primary care physician a regular checkup  Patient  expressed understanding and was in agreement with this plan. She also understands that She can call clinic at any time with any questions, concerns, or complaints.    No matching staging information was found for the patient.  Creola Corn, MD   10/21/2015 5:07 PM          [place]  Telephone:(336) [phone] Fax:(336) [phone]  ID: Lennox Grumbles OB: [birth year]  MR#: [id number]  GEX#:[id number]  Patient Care Team: No Pcp Per Patient as PCP - General (General Practice)  CHIEF COMPLAINT: Breast Cancer   HPI:   This is a very pleasant 55 year old lady who was diagnosed with  Carcinoma of right breast diagnosed July 2004.  ER PR positive.  HER-2 3+.Neoadjuvant chemotherapy with Cytoxan Adriamycin and Taxol.  She then underwent a lumpectomy followed by radiation in March of 2005.  Started on tamoxifen in February 2005. Received Herceptin from May 2005 to May 2006.Marland Kitchen Finished tamoxifen in no February of 2010. Started on extended adjuvant therapy with Lupron and Femara from March of 2010    Femara has been discontinued in April of 2015 BRCA mutation negative.  She is doing well today, denies having let any new breast lumps. No new bone pains She has had no recent illnesses, she take omega 3 supplements , in good health otherwise No weight loss or change in bowel habits    REVIEW OF SYSTEMS:   ROS  As per HPI. Otherwise, a complete review of systems is negative.  PAST MEDICAL HISTORY: Past Medical History:  Diagnosis Date  . Breast cancer ([place]) 2003   chemo/ radiation    PAST SURGICAL HISTORY: Past Surgical History:  Procedure Laterality Date  . BREAST BIOPSY Right 2003   positive    FAMILY HISTORY: No family history on file.  ADVANCED DIRECTIVES (Y/N):  N  HEALTH MAINTENANCE: Social History  Substance Use Topics  . Smoking status: Never Smoker  . Smokeless tobacco: Not on file  . Alcohol use Not on file      Colonoscopy:  PAP:  Bone density:  Lipid panel:  Allergies  Allergen Reactions  . Sulfa Antibiotics Rash    Current Outpatient Prescriptions  Medication Sig Dispense Refill  .  Omega-3 Fatty Acids (OMEGA-3 FISH OIL PO) Take by mouth.     No current facility-administered medications for this visit.     OBJECTIVE: Vitals:   10/19/15 1041  BP: 131/87  Pulse: (!) 117  Resp: 18  Temp: 97.6 F (36.4 C)     Body mass index is 27.24 kg/m.   1.97 meters squared  ECOG FS:0 - Asymptomatic  Physical Exam  Constitutional: She is oriented to person, place, and time. She appears well-developed and well-nourished.  HENT:  Head: Normocephalic and atraumatic.  Nose: Nose normal.  Mouth/Throat: Oropharynx is clear and moist.  Eyes: EOM are normal. Pupils are equal, round, and reactive to light.  Neck: Normal range of motion. Neck supple.  Cardiovascular: Normal rate and regular rhythm.   Pulmonary/Chest: Effort normal and breath sounds normal.  Bilateral breast exam is normal, no palpable lumps, expected changes from scarring resulting from lumpectomy noted in the right breast No axillary adenopathy was felt in either axilla  Abdominal: Soft. Bowel sounds are normal.  Musculoskeletal: Normal range of motion. She exhibits no edema.  Lymphadenopathy:    She has no cervical adenopathy.  Neurological: She is alert and oriented to person, place, and time. She has normal reflexes.  Skin: Skin is warm.  Psychiatric: She has a normal mood and affect. Her behavior is normal. Judgment and thought content normal.  Nursing note and vitals reviewed.    LAB RESULTS:  Lab Results  Component Value Date   NA 138 10/19/2015   K 3.8 10/19/2015   CL 105 10/19/2015   CO2 23 10/19/2015   GLUCOSE 103 (H) 10/19/2015   BUN 16 10/19/2015   CREATININE 0.88 10/19/2015   CALCIUM 9.6 10/19/2015   PROT 8.4 (H) 10/19/2015   ALBUMIN 4.4 10/19/2015   AST 23 10/19/2015   ALT 27 10/19/2015    ALKPHOS 76 10/19/2015   BILITOT 0.5 10/19/2015   GFRNONAA >60 10/19/2015   GFRAA >60 10/19/2015    Lab Results  Component Value Date   WBC 8.8 10/19/2015   NEUTROABS 6.2 10/19/2015   HGB 14.0 10/19/2015   HCT 41.5 10/19/2015   MCV 86.2 10/19/2015   PLT 310 10/19/2015     STUDIES: Mm Screening Breast Tomo Bilateral  Result Date: 09/30/2015 CLINICAL DATA:  Screening. EXAM: 2D DIGITAL SCREENING BILATERAL MAMMOGRAM WITH CAD AND ADJUNCT TOMO COMPARISON:  None. ACR Breast Density Category a: The breast tissue is almost entirely fatty. FINDINGS: There are no findings suspicious for malignancy. Stable postlumpectomy changes on the right. Images were processed with CAD. IMPRESSION: No mammographic evidence of malignancy. A result letter of this screening mammogram will be mailed directly to the patient. RECOMMENDATION: Screening mammogram in one year. (Code:SM-B-01Y) BI-RADS CATEGORY  2: Benign. Electronically Signed   By: Lajean Manes M.D.   On: 09/30/2015 12:38    ASSESSMENT:  Personal history of malignant neoplasm of breast Right breast cancer in remission since 2004, completed all adjuvnat therapy Mammography from 9/17 reviewed, benign, needing annual follow-up  Urged to follow up with primary care for preventive care for HTN, DM and cholesterol return in 1 yr after repeat mammogram.     PLAN:    Patient expressed understanding and was in agreement with this plan. She also understands that She can call clinic at any time with any questions, concerns, or complaints.  Orders Placed This Encounter  Procedures  . MM Digital Screening    Standing Status:   Future    Standing Expiration Date:  10/18/2016    Order Specific Question:   Reason for Exam (SYMPTOM  OR DIAGNOSIS REQUIRED)    Answer:   Breast Cancer    Order Specific Question:   Is the patient pregnant?    Answer:   No    Order Specific Question:   Preferred imaging location?    Answer:   [place]    No  Follow-up on file. No matching staging information was found for the patient.  Creola Corn, MD   10/20/2015 2:38 PM

## 2015-10-21 DIAGNOSIS — D0591 Unspecified type of carcinoma in situ of right breast: Secondary | ICD-10-CM | POA: Insufficient documentation

## 2015-10-21 DIAGNOSIS — Z853 Personal history of malignant neoplasm of breast: Secondary | ICD-10-CM | POA: Insufficient documentation

## 2015-10-21 NOTE — Assessment & Plan Note (Signed)
Right breast cancer in remission since 2004, completed all adjuvnat therapy Mammography from 9/17 reviewed, benign, needing annual follow-up  Urged to follow up with primary care for preventive care for HTN, DM and cholesterol return in 1 yr after repeat mammogram.

## 2015-12-29 ENCOUNTER — Ambulatory Visit: Payer: 59 | Admitting: Family

## 2016-02-23 ENCOUNTER — Ambulatory Visit (INDEPENDENT_AMBULATORY_CARE_PROVIDER_SITE_OTHER): Payer: 59 | Admitting: Family

## 2016-02-23 ENCOUNTER — Other Ambulatory Visit: Payer: Self-pay | Admitting: Family

## 2016-02-23 ENCOUNTER — Encounter: Payer: Self-pay | Admitting: Family

## 2016-02-23 VITALS — BP 108/82 | HR 102 | Temp 98.4°F | Ht 67.0 in | Wt 174.2 lb

## 2016-02-23 DIAGNOSIS — Z0000 Encounter for general adult medical examination without abnormal findings: Secondary | ICD-10-CM | POA: Insufficient documentation

## 2016-02-23 DIAGNOSIS — Z7689 Persons encountering health services in other specified circumstances: Secondary | ICD-10-CM | POA: Diagnosis not present

## 2016-02-23 DIAGNOSIS — E0590 Thyrotoxicosis, unspecified without thyrotoxic crisis or storm: Secondary | ICD-10-CM

## 2016-02-23 DIAGNOSIS — Z853 Personal history of malignant neoplasm of breast: Secondary | ICD-10-CM | POA: Diagnosis not present

## 2016-02-23 LAB — HEMOGLOBIN A1C: Hgb A1c MFr Bld: 6 % (ref 4.6–6.5)

## 2016-02-23 LAB — COMPREHENSIVE METABOLIC PANEL
ALT: 21 U/L (ref 0–35)
AST: 14 U/L (ref 0–37)
Albumin: 4.4 g/dL (ref 3.5–5.2)
Alkaline Phosphatase: 74 U/L (ref 39–117)
BUN: 10 mg/dL (ref 6–23)
CO2: 30 mEq/L (ref 19–32)
Calcium: 10.2 mg/dL (ref 8.4–10.5)
Chloride: 104 mEq/L (ref 96–112)
Creatinine, Ser: 0.84 mg/dL (ref 0.40–1.20)
GFR: 74.99 mL/min (ref >60.00)
Glucose, Bld: 108 mg/dL — ABNORMAL HIGH (ref 70–99)
Potassium: 4.1 mEq/L (ref 3.5–5.1)
Sodium: 140 mEq/L (ref 135–145)
Total Bilirubin: 0.6 mg/dL (ref 0.2–1.2)
Total Protein: 7.5 g/dL (ref 6.0–8.3)

## 2016-02-23 LAB — CBC WITH DIFFERENTIAL/PLATELET
Basophils Absolute: 0.1 10*3/uL (ref 0.0–0.1)
Basophils Relative: 0.8 % (ref 0.0–3.0)
Eosinophils Absolute: 0 10*3/uL (ref 0.0–0.7)
Eosinophils Relative: 0.6 % (ref 0.0–5.0)
HCT: 40.5 % (ref 36.0–46.0)
Hemoglobin: 13.6 g/dL (ref 12.0–15.0)
Lymphocytes Relative: 17 % (ref 12.0–46.0)
Lymphs Abs: 1.3 10*3/uL (ref 0.7–4.0)
MCHC: 33.5 g/dL (ref 30.0–36.0)
MCV: 88.4 fl (ref 78.0–100.0)
Monocytes Absolute: 0.3 10*3/uL (ref 0.1–1.0)
Monocytes Relative: 4.2 % (ref 3.0–12.0)
Neutro Abs: 5.9 10*3/uL (ref 1.4–7.7)
Neutrophils Relative %: 77.4 % — ABNORMAL HIGH (ref 43.0–77.0)
Platelets: 298 10*3/uL (ref 150.0–400.0)
RBC: 4.58 Mil/uL (ref 3.87–5.11)
RDW: 14.4 % (ref 11.5–15.5)
WBC: 7.6 10*3/uL (ref 4.0–10.5)

## 2016-02-23 LAB — VITAMIN D 25 HYDROXY (VIT D DEFICIENCY, FRACTURES): VITD: 39.21 ng/mL (ref 30.00–100.00)

## 2016-02-23 LAB — T4, FREE: Free T4: 2.74 ng/dL — ABNORMAL HIGH (ref 0.60–1.60)

## 2016-02-23 LAB — LIPID PANEL
Cholesterol: 273 mg/dL — ABNORMAL HIGH (ref 0–200)
HDL: 51.3 mg/dL (ref >39.00)
LDL Cholesterol: 191 mg/dL — ABNORMAL HIGH (ref 0–99)
NonHDL: 221.57
Total CHOL/HDL Ratio: 5
Triglycerides: 153 mg/dL — ABNORMAL HIGH (ref 0.0–149.0)
VLDL: 30.6 mg/dL (ref 0.0–40.0)

## 2016-02-23 LAB — HIV ANTIBODY (ROUTINE TESTING W REFLEX): HIV 1&2 Ab, 4th Generation: NONREACTIVE

## 2016-02-23 LAB — TSH: TSH: 0.69 u[IU]/mL (ref 0.35–4.50)

## 2016-02-23 NOTE — Assessment & Plan Note (Addendum)
Reviewed past medical history. Fasting labs ordered. Will return for CPE. She will also clarify when colonoscopy due.

## 2016-02-23 NOTE — Progress Notes (Signed)
Pre visit review using our clinic review tool, if applicable. No additional management support is needed unless otherwise documented below in the visit note. 

## 2016-02-23 NOTE — Progress Notes (Signed)
Subjective:    Patient ID: Teresa Franco, female    DOB: [birth year], 56 y.o.   MRN: [id number]  CC: Teresa Franco is a 56 y.o. female who presents today to establish care.    HPI: Has not had recent PCP.   [facility] for [facility]. Done 12/2015 which was abnormal. Had colposcopy which returned normal. Suspect early menopause in chemo causing abormal pap per patient.   Colonoscopy at least 6 years ago, dr sankur, unsure if supposed to repeat in 5 or 10 years/     right breast cancer 2003, chemo and radiation, lumpectomy; Dr. Sherrine Maples - off letrazole.  Annual mammograms. Follows with oncology annually.   Cardiologist- TSH, T4  HISTORY:  Past Medical History:  Diagnosis Date  . Breast cancer ([place]) 2003   chemo/ radiation  . Hyperlipidemia   . UTI (urinary tract infection)    Past Surgical History:  Procedure Laterality Date  . BREAST BIOPSY Right 2003   positive   Family History  Problem Relation Age of Onset  . Hyperlipidemia Mother   . Heart disease Mother   . Hyperlipidemia Father   . Heart disease Father   . Breast cancer Other 60  . Colon cancer Neg Hx     Allergies: Sulfa antibiotics No current outpatient prescriptions on file prior to visit.   No current facility-administered medications on file prior to visit.     Social History  Substance Use Topics  . Smoking status: Never Smoker  . Smokeless tobacco: Never Used  . Alcohol use Not on file    Review of Systems  Constitutional: Negative for chills and fever.  Respiratory: Negative for cough.   Cardiovascular: Negative for chest pain and palpitations.  Gastrointestinal: Negative for nausea and vomiting.      Objective:    BP 108/82   Pulse (!) 102   Temp 98.4 F (36.9 C) (Oral)   Ht 5' 7"  (1.702 m)   Wt 174 lb 3.2 oz (79 kg)   SpO2 97%   BMI 27.28 kg/m  BP Readings from Last 3 Encounters:  02/23/16 108/82  10/19/15 131/87  09/22/14 131/89   Wt Readings from Last 3  Encounters:  02/23/16 174 lb 3.2 oz (79 kg)  10/19/15 179 lb 2 oz (81.3 kg)  09/22/14 185 lb 3 oz (84 kg)    Physical Exam  Constitutional: She appears well-developed and well-nourished.  Eyes: Conjunctivae are normal.  Cardiovascular: Normal rate, regular rhythm, normal heart sounds and normal pulses.   Pulmonary/Chest: Effort normal and breath sounds normal. She has no wheezes. She has no rhonchi. She has no rales.  Neurological: She is alert.  Skin: Skin is warm and dry.  Psychiatric: She has a normal mood and affect. Her speech is normal and behavior is normal. Thought content normal.  Vitals reviewed.      Assessment & Plan:   Problem List Items Addressed This Visit      Other   Personal history of malignant neoplasm of breast   Encounter to establish care - Primary    Reviewed past medical history. Fasting labs ordered. Will return for CPE. She will also clarify when colonoscopy due.       Relevant Orders   CBC with Differential/Platelet   Comprehensive metabolic panel   Hemoglobin A1c   Lipid panel   TSH   VITAMIN D 25 Hydroxy (Vit-D Deficiency, Fractures)   T4, free   Hepatitis C antibody  HIV antibody       I have discontinued Ms. Herrin's Omega-3 Fatty Acids (OMEGA-3 FISH OIL PO). I am also having her maintain her BIOTIN PO, MEGARED OMEGA-3 KRILL OIL PO, and Cholecalciferol (VITAMIN D3 PO).   Meds ordered this encounter  Medications  . BIOTIN PO    Sig: Take by mouth.  Marland Kitchen MEGARED OMEGA-3 KRILL OIL PO    Sig: Take by mouth.  . Cholecalciferol (VITAMIN D3 PO)    Sig: Take by mouth.    Return precautions given.   Risks, benefits, and alternatives of the medications and treatment plan prescribed today were discussed, and patient expressed understanding.   Education regarding symptom management and diagnosis given to patient on AVS.  Continue to follow with Mable Paris, FNP for routine health maintenance.   Teresa Franco and I agreed with  plan.   Mable Paris, FNP

## 2016-02-23 NOTE — Patient Instructions (Signed)
Please call sankur and ask when colonoscopy is due.   Pleasure meeting you  Labs

## 2016-02-24 LAB — HEPATITIS C ANTIBODY: HCV Ab: NEGATIVE

## 2016-04-18 ENCOUNTER — Ambulatory Visit (INDEPENDENT_AMBULATORY_CARE_PROVIDER_SITE_OTHER): Payer: 59 | Admitting: Family

## 2016-04-18 ENCOUNTER — Encounter: Payer: Self-pay | Admitting: Family

## 2016-04-18 VITALS — BP 130/76 | HR 99 | Temp 98.7°F | Ht 67.0 in | Wt 182.4 lb

## 2016-04-18 DIAGNOSIS — Z0000 Encounter for general adult medical examination without abnormal findings: Secondary | ICD-10-CM

## 2016-04-18 DIAGNOSIS — Z8249 Family history of ischemic heart disease and other diseases of the circulatory system: Secondary | ICD-10-CM | POA: Diagnosis not present

## 2016-04-18 DIAGNOSIS — E785 Hyperlipidemia, unspecified: Secondary | ICD-10-CM

## 2016-04-18 NOTE — Assessment & Plan Note (Addendum)
Follows with OB/GYN. declines breast exam or Pap smear states as up-to-date. States colonoscopy is also up-to-date. Unable to see records. Due for tetanus vaccine advised that this a pharmacy. Congratulated patient on regular exercise. Screening labs today.

## 2016-04-18 NOTE — Progress Notes (Signed)
Subjective:    Patient ID: Teresa Franco, female    DOB: [birth year], 56 y.o.   MRN: [id number]  CC: Teresa Franco is a 56 y.o. female who presents today for physical exam.    HPI: Feels well  Does have concern of CVD due to father's death at 24 yo.   Does report having echo, stress test, and holter with [facility] Cardiologist- reports 4 years ago and states normal. Exercises without chest pain. Denies exertional chest pain or pressure, numbness or tingling radiating to left arm or jaw, palpitations, dizziness, frequent headaches, changes in vision, or shortness of breath.    HLD- would like to follow lifestyle modifications    Colorectal Cancer Screening: due Breast Cancer Screening: Mammogram UTD; h/o right breast cancer; follows with [facility] Cervical Cancer Screening: UTD Bone Health screening/DEXA for 65+: No increased fracture risk. Defer screening at this time. Lung Cancer Screening: Doesn't have 30 year pack year history and age > 22 years       Tetanus - due         Labs: Screening labs today. Exercise: Gets regular exercise; has joined  Alcohol use: none Smoking/tobacco use: Nonsmoker.  Regular dental exams: UTD Wears seat belt: Yes. Skin: no concerning lesions or   HISTORY:  Past Medical History:  Diagnosis Date  . Breast cancer ([place]) 2003   chemo/ radiation  . Hyperlipidemia   . UTI (urinary tract infection)     Past Surgical History:  Procedure Laterality Date  . BREAST BIOPSY Right 2003   positive   Family History  Problem Relation Age of Onset  . Hyperlipidemia Mother   . Heart disease Mother   . Thyroid disease Mother   . Hyperlipidemia Father   . Heart disease Father 32    MI- died at 31 ? SCD  . Breast cancer Other 60  . Colon cancer Neg Hx   . Ovarian cancer Neg Hx   . Melanoma Neg Hx       ALLERGIES: Sulfa antibiotics  Current Outpatient Prescriptions on File Prior to Visit  Medication Sig Dispense Refill  . BIOTIN PO Take by mouth.     . Cholecalciferol (VITAMIN D3 PO) Take by mouth.    Marland Kitchen MEGARED OMEGA-3 KRILL OIL PO Take by mouth.     No current facility-administered medications on file prior to visit.     Social History  Substance Use Topics  . Smoking status: Never Smoker  . Smokeless tobacco: Never Used  . Alcohol use Not on file    Review of Systems  Constitutional: Negative for chills, fever and unexpected weight change.  HENT: Negative for congestion.   Respiratory: Negative for cough.   Cardiovascular: Negative for chest pain, palpitations and leg swelling.  Gastrointestinal: Negative for nausea and vomiting.  Musculoskeletal: Negative for arthralgias and myalgias.  Skin: Negative for rash.  Neurological: Negative for headaches.  Hematological: Negative for adenopathy.  Psychiatric/Behavioral: Negative for confusion.      Objective:    BP 130/76   Pulse 99   Temp 98.7 F (37.1 C) (Oral)   Ht 5\' 7"  (1.702 m)   Wt 182 lb 6.4 oz (82.7 kg)   SpO2 98%   BMI 28.57 kg/m   BP Readings from Last 3 Encounters:  04/18/16 130/76  02/23/16 108/82  10/19/15 131/87   Wt Readings from Last 3 Encounters:  04/18/16 182 lb 6.4 oz (82.7 kg)  02/23/16 174 lb 3.2 oz (79 kg)  10/19/15 179 lb  2 oz (81.3 kg)    Physical Exam  Constitutional: She appears well-developed and well-nourished.  Eyes: Conjunctivae are normal.  Neck: No thyroid mass and no thyromegaly present.  Cardiovascular: Normal rate, regular rhythm, normal heart sounds and normal pulses.   Pulmonary/Chest: Effort normal and breath sounds normal. She has no wheezes. She has no rhonchi. She has no rales.  Lymphadenopathy:       Head (right side): No submental, no submandibular, no tonsillar, no preauricular, no posterior auricular and no occipital adenopathy present.       Head (left side): No submental, no submandibular, no tonsillar, no preauricular, no posterior auricular and no occipital adenopathy present.    She has no cervical  adenopathy.  Neurological: She is alert.  Skin: Skin is warm and dry.  Psychiatric: She has a normal mood and affect. Her speech is normal and behavior is normal. Thought content normal.  Vitals reviewed.      Assessment & Plan:   Problem List Items Addressed This Visit      Other   Routine physical examination    Follows with OB/GYN. declines breast exam or Pap smear states as up-to-date. States colonoscopy is also up-to-date. Unable to see records. Due for tetanus vaccine advised that this a pharmacy. Congratulated patient on regular exercise. Screening labs today.       Family history of cardiovascular disorder    Asymptomatic. Concerns father's history and at age 12.declines a cardiac workup today.She had a cardiac workup 4 years ago per patient.She will let me know and follow up If she would like to pursue.      HLD (hyperlipidemia)    Will recheck at follow up. Discussed starting statin due to family history. She would like to start lifestyle modifications first.           I am having Ms. Rockhill maintain her BIOTIN PO, MEGARED OMEGA-3 KRILL OIL PO, and Cholecalciferol (VITAMIN D3 PO).   No orders of the defined types were placed in this encounter.   Return precautions given.   Risks, benefits, and alternatives of the medications and treatment plan prescribed today were discussed, and patient expressed understanding.   Education regarding symptom management and diagnosis given to patient on AVS.   Continue to follow with Mable Paris, FNP for routine health maintenance.   Teresa Franco and I agreed with plan.   Mable Paris, FNP

## 2016-04-18 NOTE — Progress Notes (Signed)
Pre visit review using our clinic review tool, if applicable. No additional management support is needed unless otherwise documented below in the visit note. 

## 2016-04-18 NOTE — Assessment & Plan Note (Addendum)
Asymptomatic. Concerns father's history and at age 56.declines a cardiac workup today.She had a cardiac workup 4 years ago per patient.She will let me know and follow up If she would like to pursue.

## 2016-04-18 NOTE — Patient Instructions (Addendum)
Tdap at local pharmacy  Let me know about pursing cardiology evaluation   We will check cholesterol at next visit   Health Maintenance, Female Adopting a healthy lifestyle and getting preventive care can go a long way to promote health and wellness. Talk with your health care provider about what schedule of regular examinations is right for you. This is a good chance for you to check in with your provider about disease prevention and staying healthy. In between checkups, there are plenty of things you can do on your own. Experts have done a lot of research about which lifestyle changes and preventive measures are most likely to keep you healthy. Ask your health care provider for more information. Weight and diet Eat a healthy diet  Be sure to include plenty of vegetables, fruits, low-fat dairy products, and lean protein.  Do not eat a lot of foods high in solid fats, added sugars, or salt.  Get regular exercise. This is one of the most important things you can do for your health.  Most adults should exercise for at least 150 minutes each week. The exercise should increase your heart rate and make you sweat (moderate-intensity exercise).  Most adults should also do strengthening exercises at least twice a week. This is in addition to the moderate-intensity exercise. Maintain a healthy weight  Body mass index (BMI) is a measurement that can be used to identify possible weight problems. It estimates body fat based on height and weight. Your health care provider can help determine your BMI and help you achieve or maintain a healthy weight.  For females 25 years of age and older:  A BMI below 18.5 is considered underweight.  A BMI of 18.5 to 24.9 is normal.  A BMI of 25 to 29.9 is considered overweight.  A BMI of 30 and above is considered obese. Watch levels of cholesterol and blood lipids  You should start having your blood tested for lipids and cholesterol at 55 years of age, then  have this test every 5 years.  You may need to have your cholesterol levels checked more often if:  Your lipid or cholesterol levels are high.  You are older than 55 years of age.  You are at high risk for heart disease. Cancer screening Lung Cancer  Lung cancer screening is recommended for adults 52-65 years old who are at high risk for lung cancer because of a history of smoking.  A yearly low-dose CT scan of the lungs is recommended for people who:  Currently smoke.  Have quit within the past 15 years.  Have at least a 30-pack-year history of smoking. A pack year is smoking an average of one pack of cigarettes a day for 1 year.  Yearly screening should continue until it has been 15 years since you quit.  Yearly screening should stop if you develop a health problem that would prevent you from having lung cancer treatment. Breast Cancer  Practice breast self-awareness. This means understanding how your breasts normally appear and feel.  It also means doing regular breast self-exams. Let your health care provider know about any changes, no matter how small.  If you are in your 20s or 30s, you should have a clinical breast exam (CBE) by a health care provider every 1-3 years as part of a regular health exam.  If you are 55 or older, have a CBE every year. Also consider having a breast X-ray (mammogram) every year.  If you have a family history  of breast cancer, talk to your health care provider about genetic screening.  If you are at high risk for breast cancer, talk to your health care provider about having an MRI and a mammogram every year.  Breast cancer gene (BRCA) assessment is recommended for women who have family members with BRCA-related cancers. BRCA-related cancers include:  Breast.  Ovarian.  Tubal.  Peritoneal cancers.  Results of the assessment will determine the need for genetic counseling and BRCA1 and BRCA2 testing. Cervical Cancer  Your health care  provider may recommend that you be screened regularly for cancer of the pelvic organs (ovaries, uterus, and vagina). This screening involves a pelvic examination, including checking for microscopic changes to the surface of your cervix (Pap test). You may be encouraged to have this screening done every 3 years, beginning at age 50.  For women ages 64-65, health care providers may recommend pelvic exams and Pap testing every 3 years, or they may recommend the Pap and pelvic exam, combined with testing for human papilloma virus (HPV), every 5 years. Some types of HPV increase your risk of cervical cancer. Testing for HPV may also be done on women of any age with unclear Pap test results.  Other health care providers may not recommend any screening for nonpregnant women who are considered low risk for pelvic cancer and who do not have symptoms. Ask your health care provider if a screening pelvic exam is right for you.  If you have had past treatment for cervical cancer or a condition that could lead to cancer, you need Pap tests and screening for cancer for at least 20 years after your treatment. If Pap tests have been discontinued, your risk factors (such as having a new sexual partner) need to be reassessed to determine if screening should resume. Some women have medical problems that increase the chance of getting cervical cancer. In these cases, your health care provider may recommend more frequent screening and Pap tests. Colorectal Cancer  This type of cancer can be detected and often prevented.  Routine colorectal cancer screening usually begins at 55 years of age and continues through 55 years of age.  Your health care provider may recommend screening at an earlier age if you have risk factors for colon cancer.  Your health care provider may also recommend using home test kits to check for hidden blood in the stool.  A small camera at the end of a tube can be used to examine your colon directly  (sigmoidoscopy or colonoscopy). This is done to check for the earliest forms of colorectal cancer.  Routine screening usually begins at age 35.  Direct examination of the colon should be repeated every 5-10 years through 55 years of age. However, you may need to be screened more often if early forms of precancerous polyps or small growths are found. Skin Cancer  Check your skin from head to toe regularly.  Tell your health care provider about any new moles or changes in moles, especially if there is a change in a mole's shape or color.  Also tell your health care provider if you have a mole that is larger than the size of a pencil eraser.  Always use sunscreen. Apply sunscreen liberally and repeatedly throughout the day.  Protect yourself by wearing long sleeves, pants, a wide-brimmed hat, and sunglasses whenever you are outside. Heart disease, diabetes, and high blood pressure  High blood pressure causes heart disease and increases the risk of stroke. High blood pressure is  more likely to develop in:  People who have blood pressure in the high end of the normal range (130-139/85-89 mm Hg).  People who are overweight or obese.  People who are African American.  If you are 45-46 years of age, have your blood pressure checked every 3-5 years. If you are 83 years of age or older, have your blood pressure checked every year. You should have your blood pressure measured twice-once when you are at a hospital or clinic, and once when you are not at a hospital or clinic. Record the average of the two measurements. To check your blood pressure when you are not at a hospital or clinic, you can use:  An automated blood pressure machine at a pharmacy.  A home blood pressure monitor.  If you are between 43 years and 32 years old, ask your health care provider if you should take aspirin to prevent strokes.  Have regular diabetes screenings. This involves taking a blood sample to check your  fasting blood sugar level.  If you are at a normal weight and have a low risk for diabetes, have this test once every three years after 55 years of age.  If you are overweight and have a high risk for diabetes, consider being tested at a younger age or more often. Preventing infection Hepatitis B  If you have a higher risk for hepatitis B, you should be screened for this virus. You are considered at high risk for hepatitis B if:  You were born in a country where hepatitis B is common. Ask your health care provider which countries are considered high risk.  Your parents were born in a high-risk country, and you have not been immunized against hepatitis B (hepatitis B vaccine).  You have HIV or AIDS.  You use needles to inject street drugs.  You live with someone who has hepatitis B.  You have had sex with someone who has hepatitis B.  You get hemodialysis treatment.  You take certain medicines for conditions, including cancer, organ transplantation, and autoimmune conditions. Hepatitis C  Blood testing is recommended for:  Everyone born from [birth year] through 1964.  Anyone with known risk factors for hepatitis C. Sexually transmitted infections (STIs)  You should be screened for sexually transmitted infections (STIs) including gonorrhea and chlamydia if:  You are sexually active and are younger than 55 years of age.  You are older than 55 years of age and your health care provider tells you that you are at risk for this type of infection.  Your sexual activity has changed since you were last screened and you are at an increased risk for chlamydia or gonorrhea. Ask your health care provider if you are at risk.  If you do not have HIV, but are at risk, it may be recommended that you take a prescription medicine daily to prevent HIV infection. This is called pre-exposure prophylaxis (PrEP). You are considered at risk if:  You are sexually active and do not regularly use condoms or  know the HIV status of your partner(s).  You take drugs by injection.  You are sexually active with a partner who has HIV. Talk with your health care provider about whether you are at high risk of being infected with HIV. If you choose to begin PrEP, you should first be tested for HIV. You should then be tested every 3 months for as long as you are taking PrEP. Pregnancy  If you are premenopausal and you may become pregnant,  ask your health care provider about preconception counseling.  If you may become pregnant, take 400 to 800 micrograms (mcg) of folic acid every day.  If you want to prevent pregnancy, talk to your health care provider about birth control (contraception). Osteoporosis and menopause  Osteoporosis is a disease in which the bones lose minerals and strength with aging. This can result in serious bone fractures. Your risk for osteoporosis can be identified using a bone density scan.  If you are 6 years of age or older, or if you are at risk for osteoporosis and fractures, ask your health care provider if you should be screened.  Ask your health care provider whether you should take a calcium or vitamin D supplement to lower your risk for osteoporosis.  Menopause may have certain physical symptoms and risks.  Hormone replacement therapy may reduce some of these symptoms and risks. Talk to your health care provider about whether hormone replacement therapy is right for you. Follow these instructions at home:  Schedule regular health, dental, and eye exams.  Stay current with your immunizations.  Do not use any tobacco products including cigarettes, chewing tobacco, or electronic cigarettes.  If you are pregnant, do not drink alcohol.  If you are breastfeeding, limit how much and how often you drink alcohol.  Limit alcohol intake to no more than 1 drink per day for nonpregnant women. One drink equals 12 ounces of beer, 5 ounces of wine, or 1 ounces of hard  liquor.  Do not use street drugs.  Do not share needles.  Ask your health care provider for help if you need support or information about quitting drugs.  Tell your health care provider if you often feel depressed.  Tell your health care provider if you have ever been abused or do not feel safe at home. This information is not intended to replace advice given to you by your health care provider. Make sure you discuss any questions you have with your health care provider. Document Released: 07/10/2010 Document Revised: 06/02/2015 Document Reviewed: 09/28/2014 Elsevier Interactive Patient Education  2017 [employer].

## 2016-04-18 NOTE — Assessment & Plan Note (Signed)
Will recheck at follow up. Discussed starting statin due to family history. She would like to start lifestyle modifications first.

## 2016-04-19 ENCOUNTER — Encounter: Payer: Self-pay | Admitting: Family

## 2016-10-01 ENCOUNTER — Ambulatory Visit
Admission: RE | Admit: 2016-10-01 | Discharge: 2016-10-01 | Disposition: A | Discharge status: Home / Self Care | Payer: 59 | Source: Ambulatory Visit | Attending: Internal Medicine | Admitting: Internal Medicine

## 2016-10-01 DIAGNOSIS — Z1231 Encounter for screening mammogram for malignant neoplasm of breast: Secondary | ICD-10-CM | POA: Diagnosis not present

## 2016-10-01 DIAGNOSIS — D0591 Unspecified type of carcinoma in situ of right breast: Secondary | ICD-10-CM

## 2016-10-01 HISTORY — DX: Personal history of antineoplastic chemotherapy: Z92.21

## 2016-10-01 HISTORY — DX: Personal history of irradiation: Z92.3

## 2016-10-04 ENCOUNTER — Encounter: Payer: Self-pay | Admitting: Hematology and Oncology

## 2016-10-04 ENCOUNTER — Inpatient Hospital Stay: Payer: 59 | Attending: Hematology and Oncology

## 2016-10-04 ENCOUNTER — Other Ambulatory Visit: Payer: Self-pay | Admitting: *Deleted

## 2016-10-04 ENCOUNTER — Inpatient Hospital Stay (HOSPITAL_BASED_OUTPATIENT_CLINIC_OR_DEPARTMENT_OTHER): Payer: 59 | Admitting: Hematology and Oncology

## 2016-10-04 VITALS — BP 110/78 | HR 118 | Temp 96.5°F | Resp 20 | Wt 179.6 lb

## 2016-10-04 DIAGNOSIS — Z9221 Personal history of antineoplastic chemotherapy: Secondary | ICD-10-CM

## 2016-10-04 DIAGNOSIS — R232 Flushing: Secondary | ICD-10-CM | POA: Insufficient documentation

## 2016-10-04 DIAGNOSIS — Z170 Estrogen receptor positive status [ER+]: Secondary | ICD-10-CM

## 2016-10-04 DIAGNOSIS — C50911 Malignant neoplasm of unspecified site of right female breast: Secondary | ICD-10-CM

## 2016-10-04 DIAGNOSIS — E8809 Other disorders of plasma-protein metabolism, not elsewhere classified: Secondary | ICD-10-CM

## 2016-10-04 DIAGNOSIS — E785 Hyperlipidemia, unspecified: Secondary | ICD-10-CM

## 2016-10-04 DIAGNOSIS — Z79899 Other long term (current) drug therapy: Secondary | ICD-10-CM

## 2016-10-04 DIAGNOSIS — Z923 Personal history of irradiation: Secondary | ICD-10-CM | POA: Insufficient documentation

## 2016-10-04 DIAGNOSIS — Z853 Personal history of malignant neoplasm of breast: Secondary | ICD-10-CM | POA: Insufficient documentation

## 2016-10-04 DIAGNOSIS — R779 Abnormality of plasma protein, unspecified: Secondary | ICD-10-CM

## 2016-10-04 DIAGNOSIS — Z803 Family history of malignant neoplasm of breast: Secondary | ICD-10-CM

## 2016-10-04 DIAGNOSIS — Z87440 Personal history of urinary (tract) infections: Secondary | ICD-10-CM | POA: Insufficient documentation

## 2016-10-04 LAB — COMPREHENSIVE METABOLIC PANEL
ALT: 22 U/L (ref 14–54)
AST: 23 U/L (ref 15–41)
Albumin: 4.6 g/dL (ref 3.5–5.0)
Alkaline Phosphatase: 86 U/L (ref 38–126)
Anion gap: 7 (ref 5–15)
BUN: 21 mg/dL — ABNORMAL HIGH (ref 6–20)
CO2: 27 mmol/L (ref 22–32)
Calcium: 9.7 mg/dL (ref 8.9–10.3)
Chloride: 102 mmol/L (ref 101–111)
Creatinine, Ser: 0.99 mg/dL (ref 0.44–1.00)
GFR calc Af Amer: >60 mL/min (ref >60)
GFR calc non Af Amer: >60 mL/min (ref >60)
Glucose, Bld: 145 mg/dL — ABNORMAL HIGH (ref 65–99)
Potassium: 4 mmol/L (ref 3.5–5.1)
Sodium: 136 mmol/L (ref 135–145)
Total Bilirubin: 0.5 mg/dL (ref 0.3–1.2)
Total Protein: 8.9 g/dL — ABNORMAL HIGH (ref 6.5–8.1)

## 2016-10-04 LAB — CBC WITH DIFFERENTIAL/PLATELET
Basophils Absolute: 0 10*3/uL (ref 0–0.1)
Basophils Relative: 1 %
Eosinophils Absolute: 0.1 10*3/uL (ref 0–0.7)
Eosinophils Relative: 1 %
HCT: 42.8 % (ref 35.0–47.0)
Hemoglobin: 14.8 g/dL (ref 12.0–16.0)
Lymphocytes Relative: 17 %
Lymphs Abs: 1.4 10*3/uL (ref 1.0–3.6)
MCH: 30.1 pg (ref 26.0–34.0)
MCHC: 34.6 g/dL (ref 32.0–36.0)
MCV: 86.9 fL (ref 80.0–100.0)
Monocytes Absolute: 0.3 10*3/uL (ref 0.2–0.9)
Monocytes Relative: 4 %
Neutro Abs: 6.2 10*3/uL (ref 1.4–6.5)
Neutrophils Relative %: 77 %
Platelets: 300 10*3/uL (ref 150–440)
RBC: 4.93 MIL/uL (ref 3.80–5.20)
RDW: 14.7 % — ABNORMAL HIGH (ref 11.5–14.5)
WBC: 8.1 10*3/uL (ref 3.6–11.0)

## 2016-10-04 NOTE — Progress Notes (Signed)
Patient here today for annual follow up regarding right breast cancer.  Last saw Dr. [place] and was previous patient of Dr. Oliva Bustard.  Patient states she had hot flashes right after completing her chemotherapy treatments.  In the past year she has noticed they are returning.

## 2016-10-04 NOTE — Progress Notes (Signed)
[facility] day:  10/04/2016  Chief Complaint: Teresa Franco is a 56 y.o. female with a history of Her2/neu + right breast cancer who is seen for reassessment.  HPI: The patient diagnosed with breast cancer in 07/2012.  No pathology is available. Lymph nodes were positive.  Tumor was ER/PR positive and HER-2/neu positive. She received neoadjuvant chemotherapy consisting of AC every 3 weeks x 4 cycles followed by Taxol and Herceptin. She underwent a lumpectomy. Pathology revealed a complete pathologic response. She then received radiation in 03/2003.   She received tamoxifen from 02/2003 - 02/2008. She received her Herceptin from 05/2003 - 05/2004.  She received extended adjuvant hormonal therapy with Lupron and Femara from 03/2008 - 04/2013.  She tolerated her treatment well.  She was treated by Dr. Forest Gleason.  She was last seen in the medical oncology clinic on 10/19/2015 by Dr. Creola Corn.  At that time, she was doing well.  Labs were unremarkable.  Mammogram on 09/30/2015 was benign.  Bilateral screening mammogram on 10/01/2016 revealed no evidence of malignancy.  During the interim, she has felt well.  She denies any B symptoms.  Patient has not had a menstrual cycle since she initially began chemotherapy in 2004. She comments that she has hot flashes more regularly (once every 3 weeks).  Patient denies night sweats. Patient is eating well; denies any weight loss.   She denies new medications or the use of herbal supplements.  Patient denies exposure to radiation and toxins.   She notes that maternal 1st and 2nd cousins have hemochromatosis.  Iron studies on 10/19/2015 revealed an iron saturation of 18% and a ferritin of 125.   Past Medical History:  Diagnosis Date  . Breast cancer ([place]) 2004   chemo/ radiation  . Hyperlipidemia   . Personal history of chemotherapy   . Personal history of radiation therapy   . UTI (urinary tract  infection)     Past Surgical History:  Procedure Laterality Date  . BREAST BIOPSY Right 07/2002   positive  . BREAST BIOPSY Right 2010   negative  . BREAST LUMPECTOMY Right 08/09/2002   positive for breast cancer    Family History  Problem Relation Age of Onset  . Hyperlipidemia Mother   . Heart disease Mother   . Thyroid disease Mother   . Hyperlipidemia Father   . Heart disease Father 52       MI- died at 31 ? SCD  . Breast cancer Other 60  . Breast cancer Maternal Aunt 29  . Colon cancer Neg Hx   . Ovarian cancer Neg Hx   . Melanoma Neg Hx     Social History:  reports that she has never smoked. She has never used smokeless tobacco. She reports that she does not use drugs. Her alcohol history is not on file.  Patient has one daughter (age 80). She is a Catering manager (accounting).  She lives in [place].  The patient is alone today.  Allergies:  Allergies  Allergen Reactions  . Sulfa Antibiotics Rash    Current Medications: Current Outpatient Prescriptions  Medication Sig Dispense Refill  . BIOTIN PO Take by mouth.    . Cholecalciferol (VITAMIN D3 PO) Take by mouth.    Marland Kitchen MEGARED OMEGA-3 KRILL OIL PO Take by mouth.     No current facility-administered medications for this visit.     Review of Systems:  GENERAL:  Feels good.  No fevers, sweats  or weight loss. PERFORMANCE STATUS (ECOG):  0 HEENT:  No visual changes, runny nose, sore throat, mouth sores or tenderness. Lungs: No shortness of breath or cough.  No hemoptysis. Cardiac:  No chest pain, palpitations, orthopnea, or PND. GI:  No nausea, vomiting, diarrhea, constipation, melena or hematochezia. GU:  No urgency, frequency, dysuria, or hematuria. Musculoskeletal:  No back pain.  No joint pain.  No muscle tenderness. Extremities:  No pain or swelling. Skin:  No rashes or skin changes. Neuro:  No headache, numbness or weakness, balance or coordination issues. Endocrine:  No diabetes, thyroid issues,  or night sweats.  Hot flashes once every 3 weeks. Psych:  No mood changes, depression or anxiety. Pain:  No focal pain. Review of systems:  All other systems reviewed and found to be negative.  Physical Exam: Blood pressure 110/78, pulse (!) 118, temperature (!) 96.5 F (35.8 C), temperature source Tympanic, resp. rate 20, weight 179 lb 9 oz (81.4 kg). GENERAL:  Well developed, well nourished, woman sitting comfortably in the exam room in no acute distress. MENTAL STATUS:  Alert and oriented to person, place and time. HEAD:  Long brown hair with light brown highlights.  Normocephalic, atraumatic, face symmetric, no Cushingoid features. EYES:  Dark rimmed glasses.  Brown eyes.  Pupils equal round and reactive to light and accomodation.  No conjunctivitis or scleral icterus. ENT:  Oropharynx clear without lesion.  Tongue normal. Mucous membranes moist.  RESPIRATORY:  Clear to auscultation without rales, wheezes or rhonchi. CARDIOVASCULAR:  Regular rate and rhythm without murmur, rub or gallop. BREAST:  Right breast with mild post-operative and post radiation changes.  No masses, skin changes or nipple discharge.  Left breast without masses, skin changes or nipple discharge.  ABDOMEN:  Soft, non-tender, with active bowel sounds, and no hepatosplenomegaly.  No masses. SKIN:  Tan.  Sternal scar s/p old port-a-cath.  Prominent rib near sternum.  No rashes, ulcers or lesions. EXTREMITIES: No edema, no skin discoloration or tenderness.  No palpable cords. LYMPH NODES: No palpable cervical, supraclavicular, axillary or inguinal adenopathy  NEUROLOGICAL: Unremarkable. PSYCH:  Appropriate.   Appointment on 10/04/2016  Component Date Value Ref Range Status  . WBC 10/04/2016 8.1  3.6 - 11.0 K/uL Final  . RBC 10/04/2016 4.93  3.80 - 5.20 MIL/uL Final  . Hemoglobin 10/04/2016 14.8  12.0 - 16.0 g/dL Final  . HCT 10/04/2016 42.8  35.0 - 47.0 % Final  . MCV 10/04/2016 86.9  80.0 - 100.0 fL Final  . MCH  10/04/2016 30.1  26.0 - 34.0 pg Final  . MCHC 10/04/2016 34.6  32.0 - 36.0 g/dL Final  . RDW 10/04/2016 14.7* 11.5 - 14.5 % Final  . Platelets 10/04/2016 300  150 - 440 K/uL Final  . Neutrophils Relative % 10/04/2016 77  % Final  . Neutro Abs 10/04/2016 6.2  1.4 - 6.5 K/uL Final  . Lymphocytes Relative 10/04/2016 17  % Final  . Lymphs Abs 10/04/2016 1.4  1.0 - 3.6 K/uL Final  . Monocytes Relative 10/04/2016 4  % Final  . Monocytes Absolute 10/04/2016 0.3  0.2 - 0.9 K/uL Final  . Eosinophils Relative 10/04/2016 1  % Final  . Eosinophils Absolute 10/04/2016 0.1  0 - 0.7 K/uL Final  . Basophils Relative 10/04/2016 1  % Final  . Basophils Absolute 10/04/2016 0.0  0 - 0.1 K/uL Final  . Sodium 10/04/2016 136  135 - 145 mmol/L Final  . Potassium 10/04/2016 4.0  3.5 - 5.1 mmol/L Final  .  Chloride 10/04/2016 102  101 - 111 mmol/L Final  . CO2 10/04/2016 27  22 - 32 mmol/L Final  . Glucose, Bld 10/04/2016 145* 65 - 99 mg/dL Final  . BUN 10/04/2016 21* 6 - 20 mg/dL Final  . Creatinine, Ser 10/04/2016 0.99  0.44 - 1.00 mg/dL Final  . Calcium 10/04/2016 9.7  8.9 - 10.3 mg/dL Final  . Total Protein 10/04/2016 8.9* 6.5 - 8.1 g/dL Final  . Albumin 10/04/2016 4.6  3.5 - 5.0 g/dL Final  . AST 10/04/2016 23  15 - 41 U/L Final  . ALT 10/04/2016 22  14 - 54 U/L Final  . Alkaline Phosphatase 10/04/2016 86  38 - 126 U/L Final  . Total Bilirubin 10/04/2016 0.5  0.3 - 1.2 mg/dL Final  . GFR calc non Af Amer 10/04/2016 >60  >60 mL/min Final  . GFR calc Af Amer 10/04/2016 >60  >60 mL/min Final   Comment: (NOTE) The eGFR has been calculated using the CKD EPI equation. This calculation has not been validated in all clinical situations. eGFR's persistently <60 mL/min signify possible Chronic Kidney Disease.   . Anion gap 10/04/2016 7  5 - 15 Final    Assessment:  Teresa Franco is a 56 y.o. female with Her2/neu + right breast cancer in 07/2012.  No pathology is available. Lymph nodes were positive.  Tumor  was ER/PR positive and HER-2/neu positive.   She received neoadjuvant AC followed by Taxol and Herceptin.  Lumpectomy revealed a complete pathologic response. She received radiation in 03/2003.   She received tamoxifen from 02/2003 - 02/2008. She received her Herceptin from 05/2003 - 05/2004.  She received extended adjuvant hormonal therapy with Lupron and Femara from 03/2008 - 04/2013.  She tolerated her treatment well.  Bilateral screening mammogram on 10/01/2016 revealed no evidence of malignancy.  She has a family history of hemochromatosis.   Iron studies on 10/19/2015 revealed an iron saturation of 18% and a ferritin of 125.  Symptomatically, she is doing well.  She notes rare hot flashes.  She has not had a menstrual cycle since 2004.  Exam reveals post-operative and post radiation changes.  Plan: 1.  Review entire medical history, diagnosis and management of breast cancer.  Discuss interim mammogram and labs.  Discuss ongoing yearly surveillance. 2.  Discuss family history of hemochromatosis.  Iron saturation and ferritin were normal. 3.  Labs today: CBC with diff, CMP, CA27.29 4.  Schedule mammogram RTC in 1 year. 5.  RTC after mammogram for MD assessment, labs (CBC with diff, CMP, CA27.29), and review of mammogram.  Addendum:  Total protein is elevated.  Check SPEP and free light chains.   Lequita Asal, MD  10/04/2016, 4:36 PM

## 2016-10-05 ENCOUNTER — Telehealth: Payer: Self-pay | Admitting: *Deleted

## 2016-10-05 LAB — CANCER ANTIGEN 27.29: CA 27.29: 13.3 U/mL (ref 0.0–38.6)

## 2016-10-05 NOTE — Telephone Encounter (Signed)
Called patient to inform her that her protein is elevated.  (nothing to do with her breast cancer).   Will do repeat labs and schedule mammogram for next year.

## 2016-10-05 NOTE — Telephone Encounter (Signed)
-----   Message from Lequita Asal, MD sent at 10/04/2016  4:51 PM EDT ----- Regarding: Please call patient  Protein is a little elevated (nothing to do with breast cancer). Follow-up labs ordered.  Please schedule mammogram for next year.  Order in.  M

## 2016-10-18 ENCOUNTER — Ambulatory Visit: Payer: 59 | Admitting: Family

## 2016-11-14 ENCOUNTER — Encounter: Payer: Self-pay | Admitting: Obstetrics and Gynecology

## 2016-11-14 ENCOUNTER — Ambulatory Visit (INDEPENDENT_AMBULATORY_CARE_PROVIDER_SITE_OTHER): Payer: 59 | Admitting: Obstetrics and Gynecology

## 2016-11-14 VITALS — BP 120/80 | HR 107 | Ht 68.0 in | Wt 180.0 lb

## 2016-11-14 DIAGNOSIS — R87612 Low grade squamous intraepithelial lesion on cytologic smear of cervix (LGSIL): Secondary | ICD-10-CM | POA: Diagnosis not present

## 2016-11-14 DIAGNOSIS — Z1239 Encounter for other screening for malignant neoplasm of breast: Secondary | ICD-10-CM

## 2016-11-14 DIAGNOSIS — Z124 Encounter for screening for malignant neoplasm of cervix: Secondary | ICD-10-CM | POA: Diagnosis not present

## 2016-11-14 DIAGNOSIS — Z1231 Encounter for screening mammogram for malignant neoplasm of breast: Secondary | ICD-10-CM

## 2016-11-14 DIAGNOSIS — Z1151 Encounter for screening for human papillomavirus (HPV): Secondary | ICD-10-CM

## 2016-11-14 DIAGNOSIS — Z01419 Encounter for gynecological examination (general) (routine) without abnormal findings: Secondary | ICD-10-CM

## 2016-11-14 NOTE — Progress Notes (Signed)
PCP: Burnard Hawthorne, FNP   Chief Complaint  Patient presents with  . Gynecologic Exam    HPI:      [name] is a 56 y.o. G2P1011 who LMP was No LMP recorded. Patient is postmenopausal., presents today for her annual examination.  Her menses are absent and she is postmenopausal. She does not have intermenstrual bleeding. She does not have vasomotor sx.   Sex activity: single partner, contraception - post menopausal status. She does not have vaginal dryness.  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 Colpo 12/05/15 with Dr. Georgianne Fick. 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 Repeat pap due today. Per Dr. Georgianne Fick, pt's with recurrent LGSIL and neg colpo/bx may be better suited for LEEP.  Hx of STDs: HPV  Last mammogram: October 01, 2016  Results were: normal--routine follow-up in 12 months. Pt is s/p breast cancer. Had lumpectomy, chemo, rad, 5 yrs of tamoxifen, and 5 yrs of letrozole. Not on any tx currently.  There is a FH of breast cancer in 2 mat grt aunts and pt. Pt did cancer genetic testing/BRCA through [facility]. There is no FH of ovarian cancer. The patient does do self-breast exams. She takes Vit D supp.  Colonoscopy: colonoscopy 7 years ago without abnormalities. . Repeat due after 10 years.   Tobacco use: The patient denies current or previous tobacco use. Alcohol use: none Exercise: not active  She does get adequate calcium and Vitamin D in her diet.  Labs with PCP.  Past Medical History:  Diagnosis Date  . Breast cancer ([place]) 2004   chemo/ radiation  . Hyperlipidemia   . LGSIL on Pap smear of cervix   . Personal history of chemotherapy   . Personal history of radiation therapy   . Prediabetes   . UTI (urinary tract infection)     Past Surgical History:  Procedure Laterality Date  . BREAST BIOPSY Right 07/2002   positive  . BREAST BIOPSY Right 2010     negative  . BREAST LUMPECTOMY Right 08/09/2002   positive for breast cancer    Family History  Problem Relation Age of Onset  . Hyperlipidemia Mother   . Heart disease Mother   . Thyroid disease Mother   . Hyperlipidemia Father   . Heart disease Father 77       MI- died at 58 ? SCD  . Breast cancer Other 60  . Breast cancer Maternal Aunt 46  . Colon cancer Neg Hx   . Ovarian cancer Neg Hx   . Melanoma Neg Hx     Social History   Socioeconomic History  . Marital status: Married    Spouse name: Not on file  . Number of children: Not on file  . Years of education: Not on file  . Highest education level: Not on file  Social Needs  . Financial resource strain: Not on file  . Food insecurity - worry: Not on file  . Food insecurity - inability: Not on file  . Transportation needs - medical: Not on file  . Transportation needs - non-medical: Not on file  Occupational History  . Not on file  Tobacco Use  . Smoking status: Never Smoker  . Smokeless tobacco: Never Used  Substance and Sexual Activity  . Alcohol use: No    Alcohol/week: 0.0 oz    Frequency: Never  . Drug use: No  . Sexual activity: No  Other Topics Concern  . Not on file  Social History Narrative   From [place]   Works for telecommunication    1 daughter             No outpatient medications have been marked as taking for the 11/14/16 encounter (Office Visit) with Copland, Alicia B, PA-C.      ROS:  Review of Systems  Constitutional: Negative for fatigue, fever and unexpected weight change.  Respiratory: Negative for cough, shortness of breath and wheezing.   Cardiovascular: Negative for chest pain, palpitations and leg swelling.  Gastrointestinal: Negative for blood in stool, constipation, diarrhea, nausea and vomiting.  Endocrine: Negative for cold intolerance, heat intolerance and polyuria.  Genitourinary: Negative for dyspareunia, dysuria, flank pain, frequency, genital sores,  hematuria, menstrual problem, pelvic pain, urgency, vaginal bleeding, vaginal discharge and vaginal pain.  Musculoskeletal: Negative for back pain, joint swelling and myalgias.  Skin: Negative for rash.  Neurological: Negative for dizziness, syncope, light-headedness, numbness and headaches.  Hematological: Negative for adenopathy.  Psychiatric/Behavioral: Negative for agitation, confusion, sleep disturbance and suicidal ideas. The patient is not nervous/anxious.      Objective: BP 120/80   Pulse (!) 107   Ht 5' 8"  (1.727 m)   Wt 180 lb (81.6 kg)   BMI 27.37 kg/m    Physical Exam  Constitutional: She is oriented to person, place, and time. She appears well-developed and well-nourished.  Genitourinary: Vagina normal and uterus normal. There is no rash or tenderness on the right labia. There is no rash or tenderness on the left labia. No erythema or tenderness in the vagina. No vaginal discharge found. Right adnexum does not display mass and does not display tenderness. Left adnexum does not display mass and does not display tenderness. Cervix does not exhibit motion tenderness or polyp. Uterus is not enlarged or tender.  Neck: Normal range of motion. No thyromegaly present.  Cardiovascular: Normal rate, regular rhythm and normal heart sounds.  No murmur heard. Pulmonary/Chest: Effort normal and breath sounds normal. Right breast exhibits no mass, no nipple discharge, no skin change and no tenderness. Left breast exhibits no mass, no nipple discharge, no skin change and no tenderness.  Abdominal: Soft. There is no tenderness. There is no guarding.  Musculoskeletal: Normal range of motion.  Neurological: She is alert and oriented to person, place, and time. No cranial nerve deficit.  Psychiatric: She has a normal mood and affect. Her behavior is normal.  Vitals reviewed.   Assessment/Plan:  Encounter for annual routine gynecological examination  Cervical cancer screening - Plan: IGP,  Aptima HPV  Screening for HPV (human papillomavirus) - Plan: IGP, Aptima HPV  Low grade squamous intraepithelial lesion on cytologic smear of cervix (LGSIL) - Repeat pap today. Will call pt with results and dispo. Per Dr. Georgianne Fick, LEEP may be better option than repeat colpo.  Screening for breast cancer - Pt followed by oncology.         GYN counsel mammography screening, adequate intake of calcium and vitamin D, diet and exercise    F/U  Return in about 1 year (around 11/14/2017).  Alicia B. Copland, PA-C 11/14/2016 12:04 PM

## 2016-11-14 NOTE — Patient Instructions (Signed)
I value your feedback and appreciate you entrusting [place] with your care. If you get a [facility] patient survey, I would appreciate you taking the time to let us know what your experience was like. Thank you!

## 2016-11-16 LAB — IGP, APTIMA HPV
HPV Aptima: NEGATIVE
PAP Smear Comment: 0

## 2016-11-19 ENCOUNTER — Encounter: Payer: Self-pay | Admitting: Family

## 2016-11-19 ENCOUNTER — Ambulatory Visit (INDEPENDENT_AMBULATORY_CARE_PROVIDER_SITE_OTHER): Payer: 59 | Admitting: Family

## 2016-11-19 VITALS — BP 104/74 | HR 94 | Temp 98.3°F | Ht 68.0 in | Wt 184.6 lb

## 2016-11-19 DIAGNOSIS — Z8249 Family history of ischemic heart disease and other diseases of the circulatory system: Secondary | ICD-10-CM

## 2016-11-19 DIAGNOSIS — E785 Hyperlipidemia, unspecified: Secondary | ICD-10-CM

## 2016-11-19 NOTE — Assessment & Plan Note (Signed)
Pending lab studies. If no improvement, due to familial history and patient concern, we will start low dose zocor.

## 2016-11-19 NOTE — Progress Notes (Signed)
Subjective:    Patient ID: Teresa Franco, female    DOB: Mar 23, 1960, 56 y.o.   MRN: [id number]  CC: Teresa Franco is a 56 y.o. female who presents today for follow up.   HPI: Overall feeling well today. Patient is here to follow-up on cholesterol. She has been working hard ibdiet and exercise hoping to improve numbers. She has concerns as her cardiovascular family history. We also discussed prior workup with cardiology due to family risk, patient would like to pursue this again.  She is able to work out without chest pain. Denies any cardiac symptoms including  exertional chest pain or pressure, numbness or tingling radiating to left arm or jaw, palpitations, dizziness, frequent headaches, changes in vision, or shortness of breath.      HISTORY:  Past Medical History:  Diagnosis Date  . Breast cancer ([place]) 2004   chemo/ radiation  . Hyperlipidemia   . LGSIL on Pap smear of cervix   . Personal history of chemotherapy   . Personal history of radiation therapy   . Prediabetes   . UTI (urinary tract infection)    Past Surgical History:  Procedure Laterality Date  . BREAST BIOPSY Right 07/2002   positive  . BREAST BIOPSY Right 2010   negative  . BREAST LUMPECTOMY Right 08/09/2002   positive for breast cancer   Family History  Problem Relation Age of Onset  . Hyperlipidemia Mother   . Heart disease Mother   . Thyroid disease Mother   . Hyperlipidemia Father   . Heart disease Father 31       MI- died at 8 ? SCD  . Breast cancer Other 60  . Breast cancer Maternal Aunt 96  . Colon cancer Neg Hx   . Ovarian cancer Neg Hx   . Melanoma Neg Hx     Allergies: Sulfa antibiotics Current Outpatient Medications on File Prior to Visit  Medication Sig Dispense Refill  . BIOTIN PO Take by mouth.    . Cholecalciferol (VITAMIN D3 PO) Take by mouth.    Marland Kitchen MEGARED OMEGA-3 KRILL OIL PO Take by mouth.    Marland Kitchen VIT B12-METHIONINE-INOS-CHOL IM      No current facility-administered  medications on file prior to visit.     Social History   Tobacco Use  . Smoking status: Never Smoker  . Smokeless tobacco: Never Used  Substance Use Topics  . Alcohol use: No    Alcohol/week: 0.0 oz    Frequency: Never  . Drug use: No    Review of Systems  Constitutional: Negative for chills and fever.  Eyes: Negative for visual disturbance.  Respiratory: Negative for cough.   Cardiovascular: Negative for chest pain and palpitations.  Gastrointestinal: Negative for nausea and vomiting.  Neurological: Negative for headaches.      Objective:    BP 104/74   Pulse 94   Temp 98.3 F (36.8 C) (Oral)   Ht 5\' 8"  (1.727 m)   Wt 184 lb 9.6 oz (83.7 kg)   SpO2 98%   BMI 28.07 kg/m  BP Readings from Last 3 Encounters:  11/19/16 104/74  11/14/16 120/80  10/04/16 110/78   Wt Readings from Last 3 Encounters:  11/19/16 184 lb 9.6 oz (83.7 kg)  11/14/16 180 lb (81.6 kg)  10/04/16 179 lb 9 oz (81.4 kg)    Physical Exam  Constitutional: She appears well-developed and well-nourished.  Eyes: Conjunctivae are normal.  Cardiovascular: Normal rate, regular rhythm, normal heart sounds and normal  pulses.  Pulmonary/Chest: Effort normal and breath sounds normal. She has no wheezes. She has no rhonchi. She has no rales.  Neurological: She is alert.  Skin: Skin is warm and dry.  Psychiatric: She has a normal mood and affect. Her speech is normal and behavior is normal. Thought content normal.  Vitals reviewed.      Assessment & Plan:   Problem List Items Addressed This Visit      Other   Family history of cardiovascular disorder    Reassured as no symptoms at this time. Based on family history however jointly agreed with patient to consult cardiology.      Relevant Orders   Ambulatory referral to Cardiology   HLD (hyperlipidemia) - Primary    Pending lab studies. If no improvement, due to familial history and patient concern, we will start low dose zocor.       Relevant  Orders   Lipid panel       I am having Rosealyn T. Heatherly maintain her BIOTIN PO, MEGARED OMEGA-3 KRILL OIL PO, Cholecalciferol (VITAMIN D3 PO), and VIT B12-METHIONINE-INOS-CHOL IM.   Meds ordered this encounter  Medications  . VIT B12-METHIONINE-INOS-CHOL IM    Return precautions given.   Risks, benefits, and alternatives of the medications and treatment plan prescribed today were discussed, and patient expressed understanding.   Education regarding symptom management and diagnosis given to patient on AVS.  Continue to follow with Burnard Hawthorne, FNP for routine health maintenance.   Teresa Franco and I agreed with plan.   Mable Paris, FNP

## 2016-11-19 NOTE — Patient Instructions (Addendum)
Labs when fasting - please call to schedule appointment  Pleasure seeing you always!

## 2016-11-19 NOTE — Progress Notes (Signed)
Pre visit review using our clinic review tool, if applicable. No additional management support is needed unless otherwise documented below in the visit note. 

## 2016-11-19 NOTE — Assessment & Plan Note (Signed)
Reassured as no symptoms at this time. Based on family history however jointly agreed with patient to consult cardiology.

## 2016-11-21 ENCOUNTER — Telehealth: Payer: Self-pay | Admitting: Obstetrics and Gynecology

## 2016-11-21 NOTE — Telephone Encounter (Signed)
LM with ASCUS/neg HPV DNA pap results. Repeat pap 1 yr. Hx of LGSIL/neg HPV DNA paps in the past.

## 2017-01-19 NOTE — Progress Notes (Signed)
Cardiology Office Note  Date:  01/21/2017   ID:  CEDRIC DENISON, DOB 1960/12/20, MRN [id number]  PCP:  Burnard Hawthorne, FNP   Chief Complaint  Patient presents with  . other    Ref by Dr. Nena Polio for family history of CAD. Meds reviewed by the pt. verbally. Pt. c/o rapid heart beats at times.      HPI:  Ms. Dry is a very pleasant 57 year old woman with past medical history of Hyperlipidemia Prediabetes, hemoglobin A1c 6.0 Smoker, stopped 19 years, smoked for 4 yrs Father died early 63s from cardiac related complications, cause unclear Who presents by referral from Mable Paris for consultation of her family history of heart disease and hyperlipidemia 64 year old daughter, wants to be a Chief Executive Officer  We have reviewed her numbers with her Total cholesterol 273 LDL 191 Trying to watch her diet, work on exercise  She denies any shortness of breath on exertion, no chest pain, no PND orthopnea She is able to work out without chest pain.   Discussed her family history Mom is 50, no heart issues Dad died early 35s  Previous Echo 5 yrs ago, was "ok", done through [facility] Records have been requested  EKG personally reviewed by myself on todays visit Shows sinus tachycardia rate 107 bpm no significant ST or T wave changes   PMH:   has a past medical history of Breast cancer ([place]) (2004), Hyperlipidemia, LGSIL on Pap smear of cervix, Personal history of chemotherapy, Personal history of radiation therapy, Prediabetes, and UTI (urinary tract infection).  PSH:    Past Surgical History:  Procedure Laterality Date  . BREAST BIOPSY Right 07/2002   positive  . BREAST BIOPSY Right 2010   negative  . BREAST LUMPECTOMY Right 08/09/2002   positive for breast cancer    Current Outpatient Medications  Medication Sig Dispense Refill  . BIOTIN PO Take by mouth daily.     . Cholecalciferol (VITAMIN D3 PO) Take by mouth daily.     Marland Kitchen MEGARED OMEGA-3 KRILL OIL PO Take by mouth daily.      Marland Kitchen VIT B12-METHIONINE-INOS-CHOL IM daily.      No current facility-administered medications for this visit.      Allergies:   Sulfa antibiotics   Social History:  The patient  reports that  has never smoked. she has never used smokeless tobacco. She reports that she does not drink alcohol or use drugs.   Family History:   family history includes Breast cancer (age of onset: 22) in her maternal aunt; Breast cancer (age of onset: 58) in her other; Heart disease in her mother; Heart disease (age of onset: 44) in her father; Hyperlipidemia in her father and mother; Thyroid disease in her mother.    Review of Systems: Review of Systems  Constitutional: Negative.   Respiratory: Negative.   Cardiovascular: Negative.   Gastrointestinal: Negative.   Musculoskeletal: Negative.   Neurological: Negative.   Psychiatric/Behavioral: Negative.   All other systems reviewed and are negative.    PHYSICAL EXAM: VS:  BP 110/80 (BP Location: Right Arm, Patient Position: Sitting, Cuff Size: Normal)   Pulse (!) 107   Ht 5' 8"  (1.727 m)   Wt 185 lb 12 oz (84.3 kg)   BMI 28.24 kg/m  , BMI Body mass index is 28.24 kg/m. GEN: Well nourished, well developed, in no acute distress  HEENT: normal  Neck: no JVD, carotid bruits, or masses Cardiac: Regular rhythm, tachycardic,  no murmurs, rubs, or gallops,no edema  Respiratory:  clear to auscultation bilaterally, normal work of breathing GI: soft, nontender, nondistended, + BS MS: no deformity or atrophy  Skin: warm and dry, no rash Neuro:  Strength and sensation are intact Psych: euthymic mood, full affect    Recent Labs: 02/23/2016: TSH 0.69 10/04/2016: ALT 22; BUN 21; Creatinine, Ser 0.99; Hemoglobin 14.8; Platelets 300; Potassium 4.0; Sodium 136    Lipid Panel Lab Results  Component Value Date   CHOL 273 (H) 02/23/2016   HDL 51.30 02/23/2016   LDLCALC 191 (H) 02/23/2016   TRIG 153.0 (H) 02/23/2016      Wt Readings from Last 3  Encounters:  01/21/17 185 lb 12 oz (84.3 kg)  11/19/16 184 lb 9.6 oz (83.7 kg)  11/14/16 180 lb (81.6 kg)       ASSESSMENT AND PLAN:  Family history of cardiovascular disorder - Plan: EKG 12-Lead, CT CARDIAC SCORING Long discussion concerning risk stratification techniques She is exercising, asymptomatic, stress testing would be low yield She would like some guidance on whether to manage cholesterol in a aggressive manner Recommended CT coronary calcium score for further guidance Other option would be carotid ultrasound She prefers CT coronary calcium score, order has been placed, directions provided Discussed possible outcomes, low score may not mandate treatment of cholesterol Higher score would warrant aggressive treatment  Sinus tachycardia Possibly secondary to nervousness being in the office today Asymptomatic Heart rate in the 90s when seen by primary care We will monitor for now.  Previous echocardiogram normal.  We received this during her visit today dated April 2013 No clinical signs of change in cardiac function If she becomes symptomatic could start low-dose beta-blocker  Mixed hyperlipidemia Numbers are elevated, CT coronary calcium score ordered  Disposition:   F/U as needed  Patient was seen in consultation from Mable Paris and will be referred back to her office for ongoing care of the issues detailed above   Total encounter time more than 60 minutes  Greater than 50% was spent in counseling and coordination of care with the patient    Orders Placed This Encounter  Procedures  . CT CARDIAC SCORING  . EKG 12-Lead     Signed, Esmond Plants, M.D., Ph.D. 01/21/2017  [place], [place]

## 2017-01-21 ENCOUNTER — Encounter: Payer: Self-pay | Admitting: Cardiovascular Disease

## 2017-01-21 ENCOUNTER — Ambulatory Visit (INDEPENDENT_AMBULATORY_CARE_PROVIDER_SITE_OTHER): Payer: 59 | Admitting: Cardiovascular Disease

## 2017-01-21 VITALS — BP 110/80 | HR 107 | Ht 68.0 in | Wt 185.8 lb

## 2017-01-21 DIAGNOSIS — R000 Tachycardia, unspecified: Secondary | ICD-10-CM | POA: Diagnosis not present

## 2017-01-21 DIAGNOSIS — E782 Mixed hyperlipidemia: Secondary | ICD-10-CM

## 2017-01-21 DIAGNOSIS — Z8249 Family history of ischemic heart disease and other diseases of the circulatory system: Secondary | ICD-10-CM

## 2017-01-21 NOTE — Patient Instructions (Addendum)
Red yeast rice Oatmeal Weight   Medication Instructions:   No medication changes made  Labwork:  No new labs needed  Testing/Procedures:  We will order a CT coronary calcium score for family hx of CAD $150  [phone]  Follow-Up: It was a pleasure seeing you in the office today. Please call us if you have new issues that need to be addressed before your next appt.  [phone]  Your physician wants you to follow-up in: As needed. We will call you with your results.   If you need a refill on your cardiac medications before your next appointment, please call your pharmacy.

## 2017-01-28 ENCOUNTER — Encounter: Payer: Self-pay | Admitting: Family

## 2017-02-06 ENCOUNTER — Ambulatory Visit (INDEPENDENT_AMBULATORY_CARE_PROVIDER_SITE_OTHER)
Admission: RE | Admit: 2017-02-06 | Discharge: 2017-02-06 | Disposition: A | Discharge status: Home / Self Care | Payer: Self-pay | Source: Ambulatory Visit | Attending: Cardiovascular Disease | Admitting: Cardiovascular Disease

## 2017-02-06 DIAGNOSIS — Z8249 Family history of ischemic heart disease and other diseases of the circulatory system: Secondary | ICD-10-CM

## 2017-02-11 ENCOUNTER — Encounter: Payer: Self-pay | Admitting: Cardiovascular Disease

## 2017-02-11 MED ORDER — ROSUVASTATIN CALCIUM 10 MG PO TABS: 10.0000 mg | ORAL_TABLET | Freq: Every day | ORAL | 3 refills | Status: DC

## 2017-06-08 HISTORY — PX: OTHER SURGICAL HISTORY: SHX169

## 2017-06-21 NOTE — Discharge Instructions (Signed)
INSTRUCTIONS FOLLOWING OCULOPLASTIC SURGERY °AMY M. FOWLER, MD ° °AFTER YOUR EYE SURGERY, THER ARE MANY THINGS THWIHC YOU, THE PATIENT, CAN DO TO ASSURE THE BEST POSSIBLE RESULT FROM YOUR OPERATION.  THIS SHEET SHOULD BE REFERRED TO WHENEVER QUESTIONS ARISE.  IF THERE ARE ANY QUESTIONS NOT ANSWERED HERE, DO NOT HESITATE TO CALL OUR OFFICE AT [phone] OR [phone].  THERE IS ALWAYS [facility] AVAILABLE TO CALL IF QUESTIONS OR PROBLEMS ARISE. ° °VISION: Your vision may be blurred and out of focus after surgery until you are able to stop using your ointment, swelling resolves and your eye(s) heal. This may take 1 to 2 weeks at the least.  If your vision becomes gradually more dim or dark, this is not normal and you need to call our office immediately. ° °EYE CARE: For the first 48 hours after surgery, use ice packs frequently - “20 minutes on, 20 minutes off” - to help reduce swelling and bruising.  Small bags of frozen peas or corn make good ice packs along with cloths soaked in ice water.  If you are wearing a patch or other type of dressing following surgery, keep this on for the amount of time specified by your doctor.  For the first week following surgery, you will need to treat your stitches with great care.  If is OK to shower, but take care to not allow soapy water to run into your eye(s) to help reduce changes of infection.  You may gently clean the eyelashes and around the eye(s) with cotton balls and sterile water, BUT DO NOT RUB THE STITCHES VIGOROUSLY.  Keeping your stitches moist with ointment will help promote healing with minimal scar formation. ° °ACTIVITY: When you leave the surgery center, you should go home, rest and be inactive.  The eye(s) may feel scratchy and keeping the eyes closed will allow for faster healing.  The first week following surgery, avoid straining (anything making the face turn red) or lifting over 20 pounds.  Additionally, avoid bending which causes your head to go below  your waist.  Using your eyes will NOT harm them, so feel free to read, watch television, use the computer, etc as desired.  Driving depends on each individual, so check with your doctor if you have questions about driving. ° °MEDICATIONS:  You will be given a prescription for an ointment to use 4 times a day on your stitches.  You can use the ointment in your eyes if they feel scratchy or irritated.  If you eyelid(s) don’t close completely when you sleep, put some ointment in your eyes before bedtime. ° °EMERGENCY: If you experience SEVERE EYE PAIN OR HEADACHE UNRELIEVED BY TYLENOL OR PERCOCET, NAUSEA OR VOMITING, WORSENING REDNESS, OR WORSENING VISION (ESPECIALLY VISION THAT WA INITIALLY BETTER) CALL [phone] OR [phone] DURING BUSINESS HOURS OR AFTER HOURS. ° °General Anesthesia, Adult, Care After °These instructions provide you with information about caring for yourself after your procedure. Your health care provider may also give you more specific instructions. Your treatment has been planned according to current medical practices, but problems sometimes occur. Call your health care provider if you have any problems or questions after your procedure. °What can I expect after the procedure? °After the procedure, it is common to have: °· Vomiting. °· A sore throat. °· Mental slowness. ° °It is common to feel: °· Nauseous. °· Cold or shivery. °· Sleepy. °· Tired. °· Sore or achy, even in parts of your body where you did not have surgery. ° °  Follow these instructions at home: °For at least 24 hours after the procedure: °· Do not: °? Participate in activities where you could fall or become injured. °? Drive. °? Use heavy machinery. °? Drink alcohol. °? Take sleeping pills or medicines that cause drowsiness. °? Make important decisions or sign legal documents. °? Take care of children on your own. °· Rest. °Eating and drinking °· If you vomit, drink water, juice, or soup when you can drink without  vomiting. °· Drink enough fluid to keep your urine clear or pale yellow. °· Make sure you have little or no nausea before eating solid foods. °· Follow the diet recommended by your health care provider. °General instructions °· Have a responsible adult stay with you until you are awake and alert. °· Return to your normal activities as told by your health care provider. Ask your health care provider what activities are safe for you. °· Take over-the-counter and prescription medicines only as told by your health care provider. °· If you smoke, do not smoke without supervision. °· Keep all follow-up visits as told by your health care provider. This is important. °Contact a health care provider if: °· You continue to have nausea or vomiting at home, and medicines are not helpful. °· You cannot drink fluids or start eating again. °· You cannot urinate after 8-12 hours. °· You develop a skin rash. °· You have fever. °· You have increasing redness at the site of your procedure. °Get help right away if: °· You have difficulty breathing. °· You have chest pain. °· You have unexpected bleeding. °· You feel that you are having a life-threatening or urgent problem. °This information is not intended to replace advice given to you by your health care provider. Make sure you discuss any questions you have with your health care provider. °Document Released: 04/02/2000 Document Revised: 05/30/2015 Document Reviewed: 12/09/2014 °Elsevier Interactive Patient Education © 2018 [employer] ° °

## 2017-06-25 ENCOUNTER — Ambulatory Visit
Admission: RE | Admit: 2017-06-25 | Discharge: 2017-06-25 | Disposition: A | Discharge status: Home / Self Care | Payer: 59 | Source: Ambulatory Visit | Attending: Ophthalmology | Admitting: Ophthalmology

## 2017-06-25 ENCOUNTER — Encounter: Payer: Self-pay | Admitting: *Deleted

## 2017-06-25 ENCOUNTER — Ambulatory Visit: Payer: 59 | Admitting: Anesthesiology

## 2017-06-25 ENCOUNTER — Encounter
Admission: RE | Disposition: A | Discharge status: Home / Self Care | Payer: Self-pay | Source: Ambulatory Visit | Attending: Ophthalmology

## 2017-06-25 DIAGNOSIS — Z882 Allergy status to sulfonamides status: Secondary | ICD-10-CM | POA: Insufficient documentation

## 2017-06-25 DIAGNOSIS — H02834 Dermatochalasis of left upper eyelid: Secondary | ICD-10-CM | POA: Insufficient documentation

## 2017-06-25 DIAGNOSIS — H02831 Dermatochalasis of right upper eyelid: Secondary | ICD-10-CM | POA: Insufficient documentation

## 2017-06-25 HISTORY — DX: Other seasonal allergic rhinitis: J30.2

## 2017-06-25 HISTORY — PX: BROW LIFT: SHX178

## 2017-06-25 HISTORY — DX: Cardiac arrhythmia, unspecified: I49.9

## 2017-06-25 SURGERY — BLEPHAROPLASTY
Anesthesia: General | Site: Eye | Laterality: Bilateral | Wound class: Clean

## 2017-06-25 MED ORDER — ERYTHROMYCIN 5 MG/GM OP OINT: TOPICAL_OINTMENT | OPHTHALMIC | 3 refills | Status: DC

## 2017-06-25 MED ORDER — TRAMADOL HCL 50 MG PO TABS: ORAL_TABLET | ORAL | 0 refills | Status: DC

## 2017-06-25 MED ORDER — PROMETHAZINE HCL 25 MG/ML IJ SOLN: 6.2500 mg | INTRAMUSCULAR | Status: DC | PRN

## 2017-06-25 MED ORDER — OXYCODONE HCL 5 MG PO TABS: 5.0000 mg | ORAL_TABLET | Freq: Once | ORAL | Status: DC | PRN

## 2017-06-25 MED ORDER — LIDOCAINE-EPINEPHRINE 2 %-1:100000 IJ SOLN
INTRAMUSCULAR | Status: DC | PRN
  Administered 2017-06-25: 1.5 mL via OPHTHALMIC

## 2017-06-25 MED ORDER — MIDAZOLAM HCL 2 MG/2ML IJ SOLN
INTRAMUSCULAR | Status: DC | PRN
  Administered 2017-06-25: 2 mg via INTRAVENOUS

## 2017-06-25 MED ORDER — ALFENTANIL 500 MCG/ML IJ INJ
INJECTION | INTRAVENOUS | Status: DC | PRN
  Administered 2017-06-25 (×2): 250 ug via INTRAVENOUS
  Administered 2017-06-25: 500 ug via INTRAVENOUS

## 2017-06-25 MED ORDER — PROPOFOL 500 MG/50ML IV EMUL
INTRAVENOUS | Status: DC | PRN
  Administered 2017-06-25: 50 ug/kg/min via INTRAVENOUS

## 2017-06-25 MED ORDER — GLYCOPYRROLATE 0.2 MG/ML IJ SOLN
INTRAMUSCULAR | Status: DC | PRN
  Administered 2017-06-25: 0.1 mg via INTRAVENOUS

## 2017-06-25 MED ORDER — DEXMEDETOMIDINE HCL 200 MCG/2ML IV SOLN
INTRAVENOUS | Status: DC | PRN
  Administered 2017-06-25 (×4): 4 ug via INTRAVENOUS

## 2017-06-25 MED ORDER — BSS IO SOLN
INTRAOCULAR | Status: DC | PRN
  Administered 2017-06-25: 15 mL

## 2017-06-25 MED ORDER — LACTATED RINGERS IV SOLN
INTRAVENOUS | Status: DC
  Administered 2017-06-25 (×2): via INTRAVENOUS

## 2017-06-25 MED ORDER — LACTATED RINGERS IV SOLN: INTRAVENOUS | Status: DC

## 2017-06-25 MED ORDER — TETRACAINE HCL 0.5 % OP SOLN
OPHTHALMIC | Status: DC | PRN
  Administered 2017-06-25: 2 [drp] via OPHTHALMIC

## 2017-06-25 MED ORDER — FENTANYL CITRATE (PF) 100 MCG/2ML IJ SOLN: 25.0000 ug | INTRAMUSCULAR | Status: DC | PRN

## 2017-06-25 MED ORDER — OXYCODONE HCL 5 MG/5ML PO SOLN: 5.0000 mg | Freq: Once | ORAL | Status: DC | PRN

## 2017-06-25 MED ORDER — ERYTHROMYCIN 5 MG/GM OP OINT
TOPICAL_OINTMENT | OPHTHALMIC | Status: DC | PRN
  Administered 2017-06-25: 1 via OPHTHALMIC

## 2017-06-25 SURGICAL SUPPLY — 35 items
APPLICATOR COTTON TIP WD 3 STR (MISCELLANEOUS) ×2 IMPLANT
BLADE SURG 15 STRL LF DISP TIS (BLADE) ×1 IMPLANT
BLADE SURG 15 STRL SS (BLADE) ×1
CORD BIP STRL DISP 12FT (MISCELLANEOUS) ×2 IMPLANT
DRAPE HEAD BAR (DRAPES) ×2 IMPLANT
GAUZE SPONGE 4X4 12PLY STRL (GAUZE/BANDAGES/DRESSINGS) ×2 IMPLANT
GAUZE SPONGE NON-WVN 2X2 STRL (MISCELLANEOUS) ×10 IMPLANT
GLOVE SURG LX 7.0 MICRO (GLOVE) ×2
GLOVE SURG LX STRL 7.0 MICRO (GLOVE) ×2 IMPLANT
MARKER SKIN XFINE TIP W/RULER (MISCELLANEOUS) ×2 IMPLANT
NEEDLE FILTER BLUNT 18X 1/2SAF (NEEDLE) ×1
NEEDLE FILTER BLUNT 18X1 1/2 (NEEDLE) ×1 IMPLANT
NEEDLE HYPO 30X.5 LL (NEEDLE) ×4 IMPLANT
PACK DRAPE NASAL/ENT (PACKS) ×2 IMPLANT
SOL PREP PVP 2OZ (MISCELLANEOUS) ×2
SOLUTION PREP PVP 2OZ (MISCELLANEOUS) ×1 IMPLANT
SPONGE VERSALON 2X2 STRL (MISCELLANEOUS) ×10
SUT CHROMIC 4-0 (SUTURE)
SUT CHROMIC 4-0 M2 12X2 ARM (SUTURE)
SUT CHROMIC 5 0 P 3 (SUTURE) IMPLANT
SUT ETHILON 4 0 CL P 3 (SUTURE) IMPLANT
SUT MERSILENE 4-0 S-2 (SUTURE) IMPLANT
SUT PLAIN GUT (SUTURE) ×2 IMPLANT
SUT PROLENE 5 0 P 3 (SUTURE) IMPLANT
SUT PROLENE 6 0 P 1 18 (SUTURE) IMPLANT
SUT SILK 4 0 G 3 (SUTURE) IMPLANT
SUT VIC AB 5-0 P-3 18X BRD (SUTURE) IMPLANT
SUT VIC AB 5-0 P3 18 (SUTURE)
SUT VICRYL 6-0  S14 CTD (SUTURE)
SUT VICRYL 6-0 S14 CTD (SUTURE) IMPLANT
SUT VICRYL 7 0 TG140 8 (SUTURE) IMPLANT
SUTURE CHRMC 4-0 M2 12X2 ARM (SUTURE) IMPLANT
SYR 10ML LL (SYRINGE) ×2 IMPLANT
SYR 3ML LL SCALE MARK (SYRINGE) ×2 IMPLANT
WATER STERILE IRR 250ML POUR (IV SOLUTION) ×2 IMPLANT

## 2017-06-25 NOTE — Transfer of Care (Signed)
Immediate Anesthesia Transfer of Care Note  Patient: Teresa Franco  Procedure(s) Performed: BLEPHAROPLASTY UPPER EYELID WITH EXCESS SKIN (Bilateral Eye)  Patient Location: PACU  Anesthesia Type: General  Level of Consciousness: awake, alert  and patient cooperative  Airway and Oxygen Therapy: Patient Spontanous Breathing and Patient connected to supplemental oxygen  Post-op Assessment: Post-op Vital signs reviewed, Patient's Cardiovascular Status Stable, Respiratory Function Stable, Patent Airway and No signs of Nausea or vomiting  Post-op Vital Signs: Reviewed and stable  Complications: No apparent anesthesia complications

## 2017-06-25 NOTE — Anesthesia Procedure Notes (Signed)
Procedure Name: MAC Date/Time: 06/25/2017 12:41 PM Performed by: Lind Guest, CRNA Pre-anesthesia Checklist: Patient identified, Emergency Drugs available, Suction available, Patient being monitored and Timeout performed Patient Re-evaluated:Patient Re-evaluated prior to induction Oxygen Delivery Method: Nasal cannula

## 2017-06-25 NOTE — H&P (Signed)
See the history and physical completed at [facility] on 06/05/17 and scanned into the chart.

## 2017-06-25 NOTE — Anesthesia Postprocedure Evaluation (Signed)
Anesthesia Post Note  Patient: CERENITY GOSHORN  Procedure(s) Performed: BLEPHAROPLASTY UPPER EYELID WITH EXCESS SKIN (Bilateral Eye)  Patient location during evaluation: PACU Anesthesia Type: General Level of consciousness: awake and alert Pain management: pain level controlled Vital Signs Assessment: post-procedure vital signs reviewed and stable Respiratory status: spontaneous breathing, nonlabored ventilation, respiratory function stable and patient connected to nasal cannula oxygen Cardiovascular status: blood pressure returned to baseline and stable Postop Assessment: no apparent nausea or vomiting Anesthetic complications: no    Rafi Kenneth C

## 2017-06-25 NOTE — Interval H&P Note (Signed)
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 After consideration of risks, benefits and other options for treatment, the patient has consented to  Procedure(s): BLEPHAROPLASTY UPPER EYELID WITH EXCESS SKIN (Bilateral) as a surgical intervention .  The patient's history has been reviewed, patient examined, no change in status, stable for surgery.  I have reviewed the patient's chart and labs.  Questions were answered to the patient's satisfaction.     Vickki Muff, Amy M

## 2017-06-25 NOTE — Op Note (Signed)
Preoperative Diagnosis:  Visually significant dermatochalasis bilateral upper Eyelid(s)  Postoperative Diagnosis:  Same.  Procedure(s) Performed:   Upper eyelid blepharoplasty with excess skin excision bilateral upper Eyelid(s)  Teaching Surgeon: Philis Pique. Vickki Muff, M.D.  Assistants: none  Anesthesia: MAC  Specimens: None.  Estimated Blood Loss: Minimal.  Complications: None.  Operative Findings: None Dictated  Procedure:   Allergies were reviewed and the patient is allergic to Sulfa antibiotics.   After the risks, benefits, complications and alternatives were discussed with the patient, appropriate informed consent was obtained and the patient was brought to the operating suite. The patient was reclined supine and a timeout was conducted.  The patient was then sedated.  Local anesthetic consisting of a 50-50 mixture of 2% lidocaine with epinephrine and 0.75% bupivacaine with added Hylenex was injected subcutaneously to both upper eyelid(s). After adequate local was instilled, the patient was prepped and draped in the usual sterile fashion for eyelid surgery.   Attention was turned to the upper eyelids. A 25m upper eyelid crease incision line was marked with calipers on bilateral upper eyelid(s).  A pinch test was used to estimate the amount of excess skin to remove and this was marked in standard blepharoplasty style fashion. Attention was turned to the right upper eyelid. A #15 blade was used to open the premarked incision line. A skin and muscle flap was excised and hemostasis was obtained with bipolar cautery.  A portion of the central fat pocket was allowed to prolapse anteriorly, cauterized towards the pedicle base and excised.  Attention was then turned to the opposite eyelid where the same procedure was performed in the same manner. Hemostasis was obtained with bipolar cautery throughout. All incisions were then closed with a combination of running and interrupted 6-0 fast  absorbing plain suture. The patient tolerated the procedure well.  Erythromycin ophthalmic ointment was applied to her incision sites, followed by ice packs. She was taken to the recovery area where she recovered without difficulty.  Post-Op Plan/Instructions:  The patient was instructed to use ice packs frequently for the next 48 hours. She was instructed to use with erythromycin ophthalmic ointment on her incisions 4 times a day for the next 12 to 14 days. She was given a prescription for Percocet for pain control should Tylenol not be effective. [facility] [place] needed [place] problems.  Sherril Heyward M. FVickki Muff M.D. Attending,Ophthalmology

## 2017-06-25 NOTE — Anesthesia Preprocedure Evaluation (Signed)
Anesthesia Evaluation  Patient identified by MRN, date of birth, ID band Patient awake    Reviewed: Allergy & Precautions, NPO status , Patient's Chart, lab work & pertinent test results  Airway Mallampati: II  TM Distance: >3 FB Neck ROM: Full    Dental no notable dental hx.    Pulmonary neg pulmonary ROS,    Pulmonary exam normal breath sounds clear to auscultation       Cardiovascular negative cardio ROS Normal cardiovascular exam Rhythm:Regular Rate:Normal  H/o palpitations. None for over a year. Pt does not stop daily activities if she feels it. Cardiac workup done.  No diagnosis. Mets 4+   Neuro/Psych negative neurological ROS  negative psych ROS   GI/Hepatic negative GI ROS, Neg liver ROS,   Endo/Other  negative endocrine ROS  Renal/GU negative Renal ROS  negative genitourinary   Musculoskeletal negative musculoskeletal ROS (+)   Abdominal   Peds negative pediatric ROS (+)  Hematology negative hematology ROS (+)   Anesthesia Other Findings   Reproductive/Obstetrics negative OB ROS                             Anesthesia Physical Anesthesia Plan  ASA: II  Anesthesia Plan: General   Post-op Pain Management:    Induction: Intravenous  PONV Risk Score and Plan: 2 and Propofol infusion  Airway Management Planned: Natural Airway  Additional Equipment:   Intra-op Plan:   Post-operative Plan: Extubation in OR  Informed Consent: I have reviewed the patients History and Physical, chart, labs and discussed the procedure including the risks, benefits and alternatives for the proposed anesthesia with the patient or authorized representative who has indicated his/her understanding and acceptance.   Dental advisory given  Plan Discussed with: CRNA  Anesthesia Plan Comments: (General IVA natural airway. Local by surgeon.)        Anesthesia Quick Evaluation

## 2017-06-26 ENCOUNTER — Encounter: Payer: Self-pay | Admitting: Ophthalmology

## 2017-10-02 ENCOUNTER — Ambulatory Visit
Admission: RE | Admit: 2017-10-02 | Discharge: 2017-10-02 | Disposition: A | Discharge status: Home / Self Care | Payer: 59 | Source: Ambulatory Visit | Attending: Hematology and Oncology | Admitting: Hematology and Oncology

## 2017-10-02 DIAGNOSIS — Z170 Estrogen receptor positive status [ER+]: Secondary | ICD-10-CM | POA: Diagnosis present

## 2017-10-02 DIAGNOSIS — C50911 Malignant neoplasm of unspecified site of right female breast: Secondary | ICD-10-CM | POA: Diagnosis present

## 2017-10-03 ENCOUNTER — Inpatient Hospital Stay (HOSPITAL_BASED_OUTPATIENT_CLINIC_OR_DEPARTMENT_OTHER): Payer: 59 | Admitting: Hematology and Oncology

## 2017-10-03 ENCOUNTER — Inpatient Hospital Stay: Payer: 59 | Attending: Hematology and Oncology

## 2017-10-03 ENCOUNTER — Encounter: Payer: Self-pay | Admitting: *Deleted

## 2017-10-03 ENCOUNTER — Encounter: Payer: Self-pay | Admitting: Hematology and Oncology

## 2017-10-03 VITALS — BP 131/93 | HR 123 | Temp 98.6°F | Resp 18 | Wt 197.2 lb

## 2017-10-03 DIAGNOSIS — N951 Menopausal and female climacteric states: Secondary | ICD-10-CM | POA: Insufficient documentation

## 2017-10-03 DIAGNOSIS — Z79899 Other long term (current) drug therapy: Secondary | ICD-10-CM | POA: Diagnosis not present

## 2017-10-03 DIAGNOSIS — C50911 Malignant neoplasm of unspecified site of right female breast: Secondary | ICD-10-CM

## 2017-10-03 DIAGNOSIS — R779 Abnormality of plasma protein, unspecified: Secondary | ICD-10-CM

## 2017-10-03 DIAGNOSIS — Z170 Estrogen receptor positive status [ER+]: Secondary | ICD-10-CM | POA: Insufficient documentation

## 2017-10-03 DIAGNOSIS — E8809 Other disorders of plasma-protein metabolism, not elsewhere classified: Secondary | ICD-10-CM

## 2017-10-03 DIAGNOSIS — Z853 Personal history of malignant neoplasm of breast: Secondary | ICD-10-CM

## 2017-10-03 LAB — COMPREHENSIVE METABOLIC PANEL
ALT: 34 U/L (ref 0–44)
AST: 33 U/L (ref 15–41)
Albumin: 4.3 g/dL (ref 3.5–5.0)
Alkaline Phosphatase: 68 U/L (ref 38–126)
Anion gap: 9 (ref 5–15)
BUN: 11 mg/dL (ref 6–20)
CO2: 25 mmol/L (ref 22–32)
Calcium: 9.6 mg/dL (ref 8.9–10.3)
Chloride: 104 mmol/L (ref 98–111)
Creatinine, Ser: 0.89 mg/dL (ref 0.44–1.00)
GFR calc Af Amer: >60 mL/min (ref >60)
GFR calc non Af Amer: >60 mL/min (ref >60)
Glucose, Bld: 121 mg/dL — ABNORMAL HIGH (ref 70–99)
Potassium: 3.6 mmol/L (ref 3.5–5.1)
Sodium: 138 mmol/L (ref 135–145)
Total Bilirubin: 0.8 mg/dL (ref 0.3–1.2)
Total Protein: 8.1 g/dL (ref 6.5–8.1)

## 2017-10-03 LAB — CBC WITH DIFFERENTIAL/PLATELET
Basophils Absolute: 0.1 10*3/uL (ref 0–0.1)
Basophils Relative: 1 %
Eosinophils Absolute: 0.1 10*3/uL (ref 0–0.7)
Eosinophils Relative: 1 %
HCT: 43.1 % (ref 35.0–47.0)
Hemoglobin: 14.5 g/dL (ref 12.0–16.0)
Lymphocytes Relative: 19 %
Lymphs Abs: 1.4 10*3/uL (ref 1.0–3.6)
MCH: 29.7 pg (ref 26.0–34.0)
MCHC: 33.6 g/dL (ref 32.0–36.0)
MCV: 88.3 fL (ref 80.0–100.0)
Monocytes Absolute: 0.6 10*3/uL (ref 0.2–0.9)
Monocytes Relative: 8 %
Neutro Abs: 5.3 10*3/uL (ref 1.4–6.5)
Neutrophils Relative %: 71 %
Platelets: 260 10*3/uL (ref 150–440)
RBC: 4.88 MIL/uL (ref 3.80–5.20)
RDW: 14.3 % (ref 11.5–14.5)
WBC: 7.4 10*3/uL (ref 3.6–11.0)

## 2017-10-03 NOTE — Progress Notes (Signed)
Patient here for 1 year follow up.  Offers no complaints today.

## 2017-10-03 NOTE — Progress Notes (Signed)
[facility] day:  10/03/2017  Chief Complaint: Teresa Franco is a 57 y.o. female with a history of Her2/neu + right breast cancer who is seen for 1 year assessment.  HPI: The patient was last seen in the medical oncology clinic on 10/04/2016 for new patient assessment.  Symptomatically, she was doing well.  She noted rare hot flashes.  She had not had a menstrual cycle since 2004.  Exam revealed post-operative and post radiation changes.  Labs revealed a normal CBC with diff.  Creatinine was 0.99, albumin 4.6, protein 8.9, and calcium 9.7.  CA27.29 was 13.3.  Patient was notified of an elevated serum protein.  SPEP and FLCA were ordered, but not performed.  3D screening mammogram on 10/02/2017 revealed no evidence of malignancy.  Patient had BILATERAL blepharoplasty about 3 months ago.    During the interim, she has done well.  She denies any breast concerns.  She has hot flashes every 2-3 months.   Past Medical History:  Diagnosis Date  . Breast cancer ([place]) 2004   chemo/ radiation  . Dysrhythmia    palpitations  . Hyperlipidemia   . LGSIL on Pap smear of cervix   . Personal history of chemotherapy   . Personal history of radiation therapy   . Prediabetes   . Seasonal allergies   . UTI (urinary tract infection)     Past Surgical History:  Procedure Laterality Date  . BREAST BIOPSY Right 07/2002   positive  . BREAST BIOPSY Right 2010   negative  . BREAST LUMPECTOMY Right 08/09/2002   positive for breast cancer  . BROW LIFT Bilateral 06/25/2017   Procedure: BLEPHAROPLASTY UPPER EYELID WITH EXCESS SKIN;  Surgeon: Karle Starch, MD;  Location: [facility];  Service: Ophthalmology;  Laterality: Bilateral;    Family History  Problem Relation Age of Onset  . Hyperlipidemia Mother   . Heart disease Mother   . Thyroid disease Mother   . Hyperlipidemia Father   . Heart disease Father 85       MI- died at 67 ? SCD  .  Breast cancer Other 60  . Breast cancer Maternal Aunt 66  . Colon cancer Neg Hx   . Ovarian cancer Neg Hx   . Melanoma Neg Hx     Social History:  reports that she has never smoked. She has never used smokeless tobacco. She reports that she does not drink alcohol or use drugs.  Patient has one daughter (age 18). She is a Catering manager (accounting).  She lives in [place].  The patient is alone today.  Allergies:  Allergies  Allergen Reactions  . Sulfa Antibiotics Rash    Current Medications: Current Outpatient Medications  Medication Sig Dispense Refill  . Ascorbic Acid (VITAMIN C) 1000 MG tablet Take 1,000 mg by mouth daily.    Marland Kitchen BIOTIN PO Take by mouth daily.     . Cholecalciferol (VITAMIN D3 PO) Take by mouth daily.     Marland Kitchen MEGARED OMEGA-3 KRILL OIL PO Take by mouth daily.     . Milk Thistle 175 MG CAPS Take by mouth daily.    . rosuvastatin (CRESTOR) 10 MG tablet Take 1 tablet (10 mg total) by mouth daily. 90 tablet 3  . VIT B12-METHIONINE-INOS-CHOL IM daily.      No current facility-administered medications for this visit.     Review of Systems:  GENERAL:  Feels good.  No fevers, sweats.  Weight up  18 pounds. PERFORMANCE STATUS (ECOG):  0 HEENT:  No visual changes, runny nose, sore throat, mouth sores or tenderness. Lungs: No shortness of breath or cough.  No hemoptysis. Cardiac:  No chest pain, palpitations, orthopnea, or PND. GI:  No nausea, vomiting, diarrhea, constipation, melena or hematochezia. GU:  No urgency, frequency, dysuria, or hematuria. Musculoskeletal:  No back pain.  No joint pain.  No muscle tenderness. Extremities:  No pain or swelling. Skin:  No rashes or skin changes. Neuro:  No headache, numbness or weakness, balance or coordination issues. Endocrine:  No diabetes, thyroid issues, or night sweats.  Hot flashes every 2-3 months. Psych:  No mood changes, depression or anxiety. Pain:  No focal pain. Review of systems:  All other systems reviewed  and found to be negative.   Physical Exam: Blood pressure (!) 131/93, pulse (!) 123, temperature 98.6 F (37 C), temperature source Tympanic, resp. rate 18, weight 197 lb 4 oz (89.5 kg). GENERAL:  Well developed, well nourished, woman sitting comfortably in the exam room in no acute distress. MENTAL STATUS:  Alert and oriented to person, place and time. HEAD:  Shoulder length brown hair.  Normocephalic, atraumatic, face symmetric, no Cushingoid features. EYES:  Glasses.  Brown eyes.  Pupils equal round and reactive to light and accomodation.  No conjunctivitis or scleral icterus. ENT:  Oropharynx clear without lesion.  Tongue normal. Mucous membranes moist.  RESPIRATORY:  Clear to auscultation without rales, wheezes or rhonchi. CARDIOVASCULAR:  Regular rate and rhythm without murmur, rub or gallop. BREAST:  Right breast mild post-operative changes.  No masses, skin changes or nipple discharge.  Left breast without masses, skin changes or nipple discharge. ABDOMEN:  Soft, non-tender, with active bowel sounds, and no hepatosplenomegaly.  No masses. SKIN:  No rashes, ulcers or lesions. EXTREMITIES: No edema, no skin discoloration or tenderness.  No palpable cords. LYMPH NODES: No palpable cervical, supraclavicular, axillary or inguinal adenopathy  NEUROLOGICAL: Unremarkable. PSYCH:  Appropriate.    Appointment on 10/03/2017  Component Date Value Ref Range Status  . Sodium 10/03/2017 138  135 - 145 mmol/L Final  . Potassium 10/03/2017 3.6  3.5 - 5.1 mmol/L Final  . Chloride 10/03/2017 104  98 - 111 mmol/L Final  . CO2 10/03/2017 25  22 - 32 mmol/L Final  . Glucose, Bld 10/03/2017 121* 70 - 99 mg/dL Final  . BUN 10/03/2017 11  6 - 20 mg/dL Final  . Creatinine, Ser 10/03/2017 0.89  0.44 - 1.00 mg/dL Final  . Calcium 10/03/2017 9.6  8.9 - 10.3 mg/dL Final  . Total Protein 10/03/2017 8.1  6.5 - 8.1 g/dL Final  . Albumin 10/03/2017 4.3  3.5 - 5.0 g/dL Final  . AST 10/03/2017 33  15 - 41 U/L  Final  . ALT 10/03/2017 34  0 - 44 U/L Final  . Alkaline Phosphatase 10/03/2017 68  38 - 126 U/L Final  . Total Bilirubin 10/03/2017 0.8  0.3 - 1.2 mg/dL Final  . GFR calc non Af Amer 10/03/2017 >60  >60 mL/min Final  . GFR calc Af Amer 10/03/2017 >60  >60 mL/min Final   Comment: (NOTE) The eGFR has been calculated using the CKD EPI equation. This calculation has not been validated in all clinical situations. eGFR's persistently <60 mL/min signify possible Chronic Kidney Disease.   Georgiann Hahn gap 10/03/2017 9  5 - 15 Final   Performed at [facility], [address] [place], [place] [address]  . WBC 10/03/2017 7.4  3.6 -  11.0 K/uL Final  . RBC 10/03/2017 4.88  3.80 - 5.20 MIL/uL Final  . Hemoglobin 10/03/2017 14.5  12.0 - 16.0 g/dL Final  . HCT 10/03/2017 43.1  35.0 - 47.0 % Final  . MCV 10/03/2017 88.3  80.0 - 100.0 fL Final  . MCH 10/03/2017 29.7  26.0 - 34.0 pg Final  . MCHC 10/03/2017 33.6  32.0 - 36.0 g/dL Final  . RDW 10/03/2017 14.3  11.5 - 14.5 % Final  . Platelets 10/03/2017 260  150 - 440 K/uL Final  . Neutrophils Relative % 10/03/2017 71  % Final  . Neutro Abs 10/03/2017 5.3  1.4 - 6.5 K/uL Final  . Lymphocytes Relative 10/03/2017 19  % Final  . Lymphs Abs 10/03/2017 1.4  1.0 - 3.6 K/uL Final  . Monocytes Relative 10/03/2017 8  % Final  . Monocytes Absolute 10/03/2017 0.6  0.2 - 0.9 K/uL Final  . Eosinophils Relative 10/03/2017 1  % Final  . Eosinophils Absolute 10/03/2017 0.1  0 - 0.7 K/uL Final  . Basophils Relative 10/03/2017 1  % Final  . Basophils Absolute 10/03/2017 0.1  0 - 0.1 K/uL Final   Performed at [facility], [address] [place], [place] [address]    Assessment:  Teresa Franco is a 57 y.o. female with Her2/neu + right breast cancer in 07/2012.  No pathology is available. Lymph nodes were positive.  Tumor was ER/PR positive and HER-2/neu positive.   She received neoadjuvant AC followed by Taxol and Herceptin.  Lumpectomy revealed a  complete pathologic response. She received radiation in 03/2003.   She received tamoxifen from 02/2003 - 02/2008. She received her Herceptin from 05/2003 - 05/2004.  She received extended adjuvant hormonal therapy with Lupron and Femara from 03/2008 - 04/2013.  She tolerated her treatment well.  Bilateral screening mammogram on 10/02/2017 revealed no evidence of malignancy.  CA27.29 has been followed: 15.3 on 08/14/2011, 16.2 on 02/14/2012, 16.0 on 04/14/2013, 9.2 on 11/23/2013, 14.3 on 09/22/2014, 13.3 on 10/04/2016, 9.4 on 10/03/2017.  She has a family history of hemochromatosis.   Iron studies on 10/19/2015 revealed an iron saturation of 18% and a ferritin of 125.  Symptomatically, she is doing well.  She has rare hot flashes.  Exam is stable.    Plan: 1.  Labs today:  CBC with diff, CMP, CA27.29, SPEP, free light chains. 2.  Her2/neu + breast cancer:  Interval mammogram normal. 3.  Elevated serum protein:  Suspect normal secondary to high albumen (4.6).  Check SPEP and FLCA. 4.  Schedule mammogram 10/03/2018. 5.  RTC in 1 year for MD assessment and labs (CBC with diff, CMp, CA27.29), and review of interval mammogram.     Honor Loh, NP  10/03/2017, 2:32 PM   I saw and evaluated the patient, participating in the key portions of the service and reviewing pertinent diagnostic studies and records.  I reviewed the nurse practitioner's note and agree with the findings and the plan.  The assessment and plan were discussed with the patient.  Several questions were asked by the patient and answered.   Nolon Stalls, MD 10/03/2017,2:32 PM

## 2017-10-04 LAB — MULTIPLE MYELOMA PANEL, SERUM
Albumin SerPl Elph-Mcnc: 4 g/dL (ref 2.9–4.4)
Albumin/Glob SerPl: 1.2 (ref 0.7–1.7)
Alpha 1: 0.2 g/dL (ref 0.0–0.4)
Alpha2 Glob SerPl Elph-Mcnc: 0.8 g/dL (ref 0.4–1.0)
B-Globulin SerPl Elph-Mcnc: 1.6 g/dL — ABNORMAL HIGH (ref 0.7–1.3)
Gamma Glob SerPl Elph-Mcnc: 1 g/dL (ref 0.4–1.8)
Globulin, Total: 3.6 g/dL (ref 2.2–3.9)
IgA: 558 mg/dL — ABNORMAL HIGH (ref 87–352)
IgG (Immunoglobin G), Serum: 1275 mg/dL (ref 700–1600)
IgM (Immunoglobulin M), Srm: 67 mg/dL (ref 26–217)
Total Protein ELP: 7.6 g/dL (ref 6.0–8.5)

## 2017-10-04 LAB — KAPPA/LAMBDA LIGHT CHAINS
Kappa free light chain: 24.9 mg/L — ABNORMAL HIGH (ref 3.3–19.4)
Kappa, lambda light chain ratio: 0.9 (ref 0.26–1.65)
Lambda free light chains: 27.7 mg/L — ABNORMAL HIGH (ref 5.7–26.3)

## 2017-10-04 LAB — CA 27.29 (SERIAL MONITOR): CA 27.29: 9.4 U/mL (ref 0.0–38.6)

## 2018-01-02 ENCOUNTER — Ambulatory Visit: Payer: 59 | Admitting: Obstetrics and Gynecology

## 2018-02-18 ENCOUNTER — Ambulatory Visit: Payer: 59 | Admitting: Family Medicine

## 2018-02-18 ENCOUNTER — Encounter: Payer: Self-pay | Admitting: Family Medicine

## 2018-02-18 VITALS — BP 106/80 | HR 88 | Temp 98.0°F | Resp 16 | Ht 68.5 in | Wt 209.0 lb

## 2018-02-18 DIAGNOSIS — R300 Dysuria: Secondary | ICD-10-CM | POA: Diagnosis not present

## 2018-02-18 DIAGNOSIS — Z23 Encounter for immunization: Secondary | ICD-10-CM

## 2018-02-18 LAB — URINALYSIS, ROUTINE W REFLEX MICROSCOPIC
Bilirubin Urine: NEGATIVE
Hgb urine dipstick: NEGATIVE
Leukocytes, UA: NEGATIVE
Nitrite: NEGATIVE
RBC / HPF: NONE SEEN (ref 0–?)
Specific Gravity, Urine: 1.025 (ref 1.000–1.030)
Total Protein, Urine: NEGATIVE
Urine Glucose: NEGATIVE
Urobilinogen, UA: 0.2 (ref 0.0–1.0)
WBC, UA: NONE SEEN (ref 0–?)
pH: 5.5 (ref 5.0–8.0)

## 2018-02-18 NOTE — Progress Notes (Signed)
Subjective:    Patient ID: Teresa Franco, female    DOB: [birth year], 58 y.o.   MRN: [id number]  HPI   Patient presents to clinic complaining of urinary frequency, burning with urination and some low back pain off and on for 3 weeks.  [facility] and was diagnosed with UTI.  She was prescribed nitrofurantoin twice daily for 5 days.  Patient has completed this course.  She also has been using the AZO.  States when she stopped taking the antibiotic, her symptoms returned.  I am able to see some of the notes from [facility] in care everywhere, but urine culture results are not visible.  Per patient she was called and told that her urine culture was negative for any bacterial growth.  Patient states she might get one UTI per year  Denies any nausea/vomiting or diarrhea.  Denies any abdominal pain.  Denies history of kidney stones.  Patient also requesting Shingrix vaccine today.  Patient Active Problem List   Diagnosis Date Noted  . Elevated serum protein level 10/03/2017  . Sinus tachycardia 01/21/2017  . Family history of cardiovascular disorder 04/18/2016  . HLD (hyperlipidemia) 04/18/2016  . Routine physical examination 02/23/2016  . History of right breast cancer 10/21/2015  . Breast cancer, right ([place]) 01/08/2002   Social History   Tobacco Use  . Smoking status: Never Smoker  . Smokeless tobacco: Never Used  Substance Use Topics  . Alcohol use: No    Alcohol/week: 0.0 standard drinks    Frequency: Never    Review of Systems  Constitutional: Negative for chills, fatigue and fever.  HENT: Negative for congestion, ear pain, sinus pain and sore throat.   Eyes: Negative.   Respiratory: Negative for cough, shortness of breath and wheezing.   Cardiovascular: Negative for chest pain, palpitations and leg swelling.  Gastrointestinal: Negative for abdominal pain, diarrhea, nausea and vomiting.  Genitourinary: +dysuria, frequency and urgency.    Musculoskeletal: Some low back pain  Skin: Negative for color change, pallor and rash.  Neurological: Negative for syncope, light-headedness and headaches.  Psychiatric/Behavioral: The patient is not nervous/anxious.       Objective:   Physical Exam  Constitutional: She appears well-developed and well-nourished. No distress.  HENT:  Head: Normocephalic and atraumatic.  Eyes: Pupils are equal, round, and reactive to light. EOM are normal. No scleral icterus.  Neck: Normal range of motion. Neck supple. No tracheal deviation present.  Cardiovascular: Normal rate, regular rhythm and normal heart sounds.  Pulmonary/Chest: Effort normal and breath sounds normal. No respiratory distress. Abdominal: Soft. Bowel sounds are normal. No CVA tenderness. Mild suprapubic tenderness.  Neurological: She is alert and oriented to person, place, and time.  Gait normal  Skin: Skin is warm and dry. No pallor.  Psychiatric: She has a normal mood and affect. Her behavior is normal.     Nursing note and vitals reviewed.   Vitals:   02/18/18 0841 02/18/18 0900  BP: 106/80   Pulse: (!) 110 88  Resp: 16   Temp: 98 F (36.7 C)   SpO2: 95%        Assessment & Plan:   Dysuria -- due to patient taking Azo, we will send urine out for urinalysis with reflex microscopic consider doing dipstick in clinic.  We will also send urine for culture.  Explained to patient that there can be multiple reasons for painful urination, it is not always related to only UTI.  We will  wait to see what urinalysis microscopic shows as well as culture before treating again with any more antibiotics.  Advised patient to keep up good water intake, wear cotton underwear, always wipe front to back, avoid holding urine when feeling urge to go.  Patient given Shingrix vaccine today  Neck step in plan of care will be based off of urinalysis and culture results.  If patient's urinary symptoms continue to persist, we can consider  referral to urology as well.

## 2018-02-18 NOTE — Patient Instructions (Signed)

## 2018-02-19 LAB — URINE CULTURE
MICRO NUMBER:: 179550
Result:: NO GROWTH
SPECIMEN QUALITY:: ADEQUATE

## 2018-03-02 ENCOUNTER — Other Ambulatory Visit: Payer: Self-pay | Admitting: Cardiovascular Disease

## 2018-03-03 ENCOUNTER — Ambulatory Visit (INDEPENDENT_AMBULATORY_CARE_PROVIDER_SITE_OTHER): Payer: 59 | Admitting: Family

## 2018-03-03 ENCOUNTER — Encounter: Payer: Self-pay | Admitting: Family

## 2018-03-03 VITALS — BP 118/74 | HR 103 | Temp 98.1°F | Ht 67.75 in | Wt 201.8 lb

## 2018-03-03 DIAGNOSIS — L509 Urticaria, unspecified: Secondary | ICD-10-CM

## 2018-03-03 DIAGNOSIS — Z0000 Encounter for general adult medical examination without abnormal findings: Secondary | ICD-10-CM

## 2018-03-03 DIAGNOSIS — R102 Pelvic and perineal pain: Secondary | ICD-10-CM

## 2018-03-03 NOTE — Patient Instructions (Signed)
Let me know if you would like referral to urology at any point  Suspect hives  Hives of unknown cause  Start clartin AND pepcid Ac to block histamine. Let me know if not better    Tips on Aggravating factors -    These include:  ?Physical factors -  As an example, heat (hot showers, extreme humidity) is a common trigger for many , and tight clothing or straps can also aggravate symptoms.   ?Anti-inflammatory medications - Nonsteroidal anti-inflammatory drugs (NSAIDs) worsen symptoms   ?Stress - Patients often report more severe symptoms during periods of physical or psychologic stress   ?Variations in dietary habits and alcohol - Although food allergy is a rare cause of, some patients will report that variations in diet, particularly rich meals or spicy foods, will aggravate symptoms. Alcohol also aggravates symptoms in some.  If hives do not improve, please let me know and remember :   Histamine 1 blocker - Either benadryl every 8 hours (may be different dosing/duration depending on formulation, please follow directions on bottle)                                         OR  claritin or zyrtec once daily  Histamine 2 blocker- Pepcid AC or Zantac twice a day   Health Maintenance for Postmenopausal Women Menopause is a normal process in which your reproductive ability comes to an end. This process happens gradually over a span of months to years, usually between the ages of 45 and 46. Menopause is complete when you have missed 12 consecutive menstrual periods. It is important to talk with your health care provider about some of the most common conditions that affect postmenopausal women, such as heart disease, cancer, and bone loss (osteoporosis). Adopting a healthy lifestyle and getting preventive care can help to promote your health and wellness. Those actions can also lower your chances of developing some of these common conditions. What should I know about menopause? During  menopause, you may experience a number of symptoms, such as:  Moderate-to-severe hot flashes.  Night sweats.  Decrease in sex drive.  Mood swings.  Headaches.  Tiredness.  Irritability.  Memory problems.  Insomnia. Choosing to treat or not to treat menopausal changes is an individual decision that you make with your health care provider. What should I know about hormone replacement therapy and supplements? Hormone therapy products are effective for treating symptoms that are associated with menopause, such as hot flashes and night sweats. Hormone replacement carries certain risks, especially as you become older. If you are thinking about using estrogen or estrogen with progestin treatments, discuss the benefits and risks with your health care provider. What should I know about heart disease and stroke? Heart disease, heart attack, and stroke become more likely as you age. This may be due, in part, to the hormonal changes that your body experiences during menopause. These can affect how your body processes dietary fats, triglycerides, and cholesterol. Heart attack and stroke are both medical emergencies. There are many things that you can do to help prevent heart disease and stroke:  Have your blood pressure checked at least every 1-2 years. High blood pressure causes heart disease and increases the risk of stroke.  If you are 69-45 years old, ask your health care provider if you should take aspirin to prevent a heart attack or a stroke.  Do  not use any tobacco products, including cigarettes, chewing tobacco, or electronic cigarettes. If you need help quitting, ask your health care provider.  It is important to eat a healthy diet and maintain a healthy weight. ? Be sure to include plenty of vegetables, fruits, low-fat dairy products, and lean protein. ? Avoid eating foods that are high in solid fats, added sugars, or salt (sodium).  Get regular exercise. This is one of the most  important things that you can do for your health. ? Try to exercise for at least 150 minutes each week. The type of exercise that you do should increase your heart rate and make you sweat. This is known as moderate-intensity exercise. ? Try to do strengthening exercises at least twice each week. Do these in addition to the moderate-intensity exercise.  Know your numbers.Ask your health care provider to check your cholesterol and your blood glucose. Continue to have your blood tested as directed by your health care provider.  What should I know about cancer screening? There are several types of cancer. Take the following steps to reduce your risk and to catch any cancer development as early as possible. Breast Cancer  Practice breast self-awareness. ? This means understanding how your breasts normally appear and feel. ? It also means doing regular breast self-exams. Let your health care provider know about any changes, no matter how small.  If you are 45 or older, have a clinician do a breast exam (clinical breast exam or CBE) every year. Depending on your age, family history, and medical history, it may be recommended that you also have a yearly breast X-ray (mammogram).  If you have a family history of breast cancer, talk with your health care provider about genetic screening.  If you are at high risk for breast cancer, talk with your health care provider about having an MRI and a mammogram every year.  Breast cancer (BRCA) gene test is recommended for women who have family members with BRCA-related cancers. Results of the assessment will determine the need for genetic counseling and BRCA1 and for BRCA2 testing. BRCA-related cancers include these types: ? Breast. This occurs in males or females. ? Ovarian. ? Tubal. This may also be called fallopian tube cancer. ? Cancer of the abdominal or pelvic lining (peritoneal cancer). ? Prostate. ? Pancreatic. Cervical, Uterine, and Ovarian  Cancer Your health care provider may recommend that you be screened regularly for cancer of the pelvic organs. These include your ovaries, uterus, and vagina. This screening involves a pelvic exam, which includes checking for microscopic changes to the surface of your cervix (Pap test).  For women ages 21-65, health care providers may recommend a pelvic exam and a Pap test every three years. For women ages 86-65, they may recommend the Pap test and pelvic exam, combined with testing for human papilloma virus (HPV), every five years. Some types of HPV increase your risk of cervical cancer. Testing for HPV may also be done on women of any age who have unclear Pap test results.  Other health care providers may not recommend any screening for nonpregnant women who are considered low risk for pelvic cancer and have no symptoms. Ask your health care provider if a screening pelvic exam is right for you.  If you have had past treatment for cervical cancer or a condition that could lead to cancer, you need Pap tests and screening for cancer for at least 20 years after your treatment. If Pap tests have been discontinued for you,  your risk factors (such as having a new sexual partner) need to be reassessed to determine if you should start having screenings again. Some women have medical problems that increase the chance of getting cervical cancer. In these cases, your health care provider may recommend that you have screening and Pap tests more often.  If you have a family history of uterine cancer or ovarian cancer, talk with your health care provider about genetic screening.  If you have vaginal bleeding after reaching menopause, tell your health care provider.  There are currently no reliable tests available to screen for ovarian cancer. Lung Cancer Lung cancer screening is recommended for adults 62-27 years old who are at high risk for lung cancer because of a history of smoking. A yearly low-dose CT scan of  the lungs is recommended if you:  Currently smoke.  Have a history of at least 30 pack-years of smoking and you currently smoke or have quit within the past 15 years. A pack-year is smoking an average of one pack of cigarettes per day for one year. Yearly screening should:  Continue until it has been 15 years since you quit.  Stop if you develop a health problem that would prevent you from having lung cancer treatment. Colorectal Cancer  This type of cancer can be detected and can often be prevented.  Routine colorectal cancer screening usually begins at age 67 and continues through age 32.  If you have risk factors for colon cancer, your health care provider may recommend that you be screened at an earlier age.  If you have a family history of colorectal cancer, talk with your health care provider about genetic screening.  Your health care provider may also recommend using home test kits to check for hidden blood in your stool.  A small camera at the end of a tube can be used to examine your colon directly (sigmoidoscopy or colonoscopy). This is done to check for the earliest forms of colorectal cancer.  Direct examination of the colon should be repeated every 5-10 years until age 25. However, if early forms of precancerous polyps or small growths are found or if you have a family history or genetic risk for colorectal cancer, you may need to be screened more often. Skin Cancer  Check your skin from head to toe regularly.  Monitor any moles. Be sure to tell your health care provider: ? About any new moles or changes in moles, especially if there is a change in a mole's shape or color. ? If you have a mole that is larger than the size of a pencil eraser.  If any of your family members has a history of skin cancer, especially at a young age, talk with your health care provider about genetic screening.  Always use sunscreen. Apply sunscreen liberally and repeatedly throughout the  day.  Whenever you are outside, protect yourself by wearing long sleeves, pants, a wide-brimmed hat, and sunglasses. What should I know about osteoporosis? Osteoporosis is a condition in which bone destruction happens more quickly than new bone creation. After menopause, you may be at an increased risk for osteoporosis. To help prevent osteoporosis or the bone fractures that can happen because of osteoporosis, the following is recommended:  If you are 10-85 years old, get at least 1,000 mg of calcium and at least 600 mg of vitamin D per day.  If you are older than age 42 but younger than age 65, get at least 1,200 mg of calcium and  at least 600 mg of vitamin D per day.  If you are older than age 73, get at least 1,200 mg of calcium and at least 800 mg of vitamin D per day. Smoking and excessive alcohol intake increase the risk of osteoporosis. Eat foods that are rich in calcium and vitamin D, and do weight-bearing exercises several times each week as directed by your health care provider. What should I know about how menopause affects my mental health? Depression may occur at any age, but it is more common as you become older. Common symptoms of depression include:  Low or sad mood.  Changes in sleep patterns.  Changes in appetite or eating patterns.  Feeling an overall lack of motivation or enjoyment of activities that you previously enjoyed.  Frequent crying spells. Talk with your health care provider if you think that you are experiencing depression. What should I know about immunizations? It is important that you get and maintain your immunizations. These include:  Tetanus, diphtheria, and pertussis (Tdap) booster vaccine.  Influenza every year before the flu season begins.  Pneumonia vaccine.  Shingles vaccine. Your health care provider may also recommend other immunizations. This information is not intended to replace advice given to you by your health care provider. Make  sure you discuss any questions you have with your health care provider. Document Released: 02/16/2005 Document Revised: 07/15/2015 Document Reviewed: 09/28/2014 Elsevier Interactive Patient Education  2019 [employer].

## 2018-03-03 NOTE — Progress Notes (Signed)
Subjective:    Patient ID: Teresa Franco, female    DOB: 1960-07-26, 58 y.o.   MRN: [id number]  CC: Teresa Franco is a 58 y.o. female who presents today for physical exam.    HPI: Rash on itchy bilateral dorsal aspect of hand x one week,  Today it is now right low back Started around left nape of neck. Comes and goes. Rash is not painful. No ha, vision swelling, unsual joint pain. No bug or tick bites.   No new lotions, creams. Did have Shingrex 02/18/2018.  HLD- compliant with crestor   Continues to have suprapubic cramp after urination. Started 6 weeks, improved. Declines urology consult.  Not fully emptying bladder. No dysuria, hematuria, constipation.   No blood seen [facility] 02/18/2018.      Colorectal Cancer Screening: UTD 2012 per patient; on 10 year repeat.  Breast Cancer Screening: Mammogram UTD; h/o right breast cancer. Follows with oncology, Dr Mike Gip.  Cervical Cancer Screening: abnormal 2018; advised to repeat with [facility] in 2019; appt with GYN in 2 weeks. Per patient, repeated pap 2019.         Tetanus - utd  Labs: Screening labs today. Exercise: Gets regular exercise.  Alcohol use: none Smoking/tobacco use: Nonsmoker.  Regular dental exams: UTD Wears seat belt: Yes. Skin: new skin tags; would like dermatology referral  HISTORY:  Past Medical History:  Diagnosis Date  . Breast cancer ([place]) 2004   chemo/ radiation  . Dysrhythmia    palpitations  . Hyperlipidemia   . LGSIL on Pap smear of cervix   . Personal history of chemotherapy   . Personal history of radiation therapy   . Prediabetes   . Seasonal allergies   . UTI (urinary tract infection)     Past Surgical History:  Procedure Laterality Date  . BREAST BIOPSY Right 07/2002   positive  . BREAST BIOPSY Right 2010   negative  . BREAST LUMPECTOMY Right 08/09/2002   positive for breast cancer  . BROW LIFT Bilateral 06/25/2017   Procedure: BLEPHAROPLASTY UPPER EYELID WITH EXCESS SKIN;  Surgeon:  Karle Starch, MD;  Location: [facility];  Service: Ophthalmology;  Laterality: Bilateral;   Family History  Problem Relation Age of Onset  . Hyperlipidemia Mother   . Heart disease Mother   . Thyroid disease Mother   . Hyperlipidemia Father   . Heart disease Father 15       MI- died at 62 ? SCD  . Breast cancer Other 60  . Breast cancer Maternal Aunt 62  . Colon cancer Neg Hx   . Ovarian cancer Neg Hx   . Melanoma Neg Hx       ALLERGIES: Sulfa antibiotics  Current Outpatient Medications on File Prior to Visit  Medication Sig Dispense Refill  . Ascorbic Acid (VITAMIN C) 1000 MG tablet Take 1,000 mg by mouth daily.    Marland Kitchen BIOTIN PO Take by mouth daily.     . Cholecalciferol (VITAMIN D3 PO) Take by mouth daily.     Marland Kitchen MEGARED OMEGA-3 KRILL OIL PO Take by mouth daily.     . Milk Thistle 175 MG CAPS Take by mouth daily.    . rosuvastatin (CRESTOR) 10 MG tablet TAKE 1 TABLET BY MOUTH EVERY DAY 30 tablet 2  . VIT B12-METHIONINE-INOS-CHOL IM daily.      No current facility-administered medications on file prior to visit.     Social History   Tobacco Use  . Smoking status: Never  Smoker  . Smokeless tobacco: Never Used  Substance Use Topics  . Alcohol use: No    Alcohol/week: 0.0 standard drinks    Frequency: Never  . Drug use: No    Review of Systems  Constitutional: Negative for chills, fever and unexpected weight change.  HENT: Negative for congestion.   Respiratory: Negative for cough.   Cardiovascular: Negative for chest pain, palpitations and leg swelling.  Gastrointestinal: Negative for nausea and vomiting.  Genitourinary: Positive for pelvic pain. Negative for dyspareunia, vaginal bleeding, vaginal discharge and vaginal pain.  Musculoskeletal: Negative for arthralgias and myalgias.  Skin: Positive for rash.  Neurological: Negative for headaches.  Hematological: Negative for adenopathy.  Psychiatric/Behavioral: Negative for confusion.      Objective:      BP 118/74 (BP Location: Left Arm, Patient Position: Sitting, Cuff Size: Large)   Pulse (!) 103   Temp 98.1 F (36.7 C)   Ht 5' 7.75" (1.721 m)   Wt 201 lb 12.8 oz (91.5 kg)   SpO2 96%   BMI 30.91 kg/m   BP Readings from Last 3 Encounters:  03/03/18 118/74  02/18/18 106/80  10/03/17 (!) 131/93   Wt Readings from Last 3 Encounters:  03/03/18 201 lb 12.8 oz (91.5 kg)  02/18/18 209 lb (94.8 kg)  10/03/17 197 lb 4 oz (89.5 kg)    Physical Exam Vitals signs reviewed.  Constitutional:      Appearance: She is well-developed.  Eyes:     Conjunctiva/sclera: Conjunctivae normal.  Neck:     Thyroid: No thyroid mass or thyromegaly.  Cardiovascular:     Rate and Rhythm: Normal rate and regular rhythm.     Pulses: Normal pulses.     Heart sounds: Normal heart sounds.  Pulmonary:     Effort: Pulmonary effort is normal.     Breath sounds: Normal breath sounds. No wheezing, rhonchi or rales.  Chest:     Breasts: Breasts are symmetrical.        Right: No inverted nipple, mass, nipple discharge, skin change or tenderness.        Left: No inverted nipple, mass, nipple discharge, skin change or tenderness.  Lymphadenopathy:     Head:     Right side of head: No submental, submandibular, tonsillar, preauricular, posterior auricular or occipital adenopathy.     Left side of head: No submental, submandibular, tonsillar, preauricular, posterior auricular or occipital adenopathy.     Cervical: No cervical adenopathy.     Right cervical: No superficial, deep or posterior cervical adenopathy.    Left cervical: No superficial, deep or posterior cervical adenopathy.  Skin:    General: Skin is warm and dry.          Comments: Serpiginous erythematous lesions right flank with excoriations. No purulent discharge, streaking, vesicular lesions.   Neurological:     Mental Status: She is alert.  Psychiatric:        Speech: Speech normal.        Behavior: Behavior normal.        Thought Content:  Thought content normal.        Assessment & Plan:   Problem List Items Addressed This Visit      Musculoskeletal and Integument   Hives    Suspect urticaria. Advised pepcid, claritin, She will let me know if not better         Other   Routine physical examination - Primary    CBE performed. Deferred pelvic exam as she follows with  CNM.       Relevant Orders   Ambulatory referral to Dermatology   CBC with Differential/Platelet   Comprehensive metabolic panel   Hemoglobin A1c   Lipid panel   VITAMIN D 25 Hydroxy (Vit-D Deficiency, Fractures)   TSH   Pelvic cramping    Intermitent.declines urology consult since improved. Will let me know if doesn't resolve.           I am having Petrina T. Bise maintain her BIOTIN PO, MEGARED OMEGA-3 KRILL OIL PO, Cholecalciferol (VITAMIN D3 PO), VIT B12-METHIONINE-INOS-CHOL IM, Milk Thistle, vitamin C, and rosuvastatin.   No orders of the defined types were placed in this encounter.   Return precautions given.   Risks, benefits, and alternatives of the medications and treatment plan prescribed today were discussed, and patient expressed understanding.   Education regarding symptom management and diagnosis given to patient on AVS.   Continue to follow with Burnard Hawthorne, FNP for routine health maintenance.   Teresa Franco and I agreed with plan.   Mable Paris, FNP

## 2018-03-05 DIAGNOSIS — R102 Pelvic and perineal pain unspecified side: Secondary | ICD-10-CM | POA: Insufficient documentation

## 2018-03-05 DIAGNOSIS — L509 Urticaria, unspecified: Secondary | ICD-10-CM | POA: Insufficient documentation

## 2018-03-05 NOTE — Assessment & Plan Note (Signed)
Suspect urticaria. Advised pepcid, claritin, She will let me know if not better

## 2018-03-05 NOTE — Assessment & Plan Note (Signed)
CBE performed. Deferred pelvic exam as she follows with CNM.

## 2018-03-05 NOTE — Assessment & Plan Note (Signed)
Intermitent.declines urology consult since improved. Will let me know if doesn't resolve.

## 2018-03-23 NOTE — Progress Notes (Signed)
PCP: Burnard Hawthorne, FNP   Chief Complaint  Patient presents with  . Gynecologic Exam    HPI:      [name] is a 58 y.o. G2P1011 who LMP was No LMP recorded. Patient is postmenopausal., presents today for her annual examination.  Her menses are absent and she is postmenopausal. She does not have intermenstrual bleeding. She does have occas vasomotor sx.   Sex activity: single partner, contraception - post menopausal status. She does not have vaginal dryness.  Last Pap:  11/24/16 ASCUS with neg HPV DNA. Repeat due this yr.  November 09, 2015  Results were: low-grade squamous intraepithelial neoplasia (LGSIL - encompassing HPV,mild dysplasia,CIN I) /neg HPV DNA. Colpo 12/05/15 with Dr. Georgianne Fick. 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  Per Dr. Georgianne Fick, pts with recurrent LGSIL and neg colpo/bx may be better suited for LEEP.  Hx of STDs: HPV  Last mammogram: 10/02/17  Results were: normal--routine follow-up in 12 months. Pt is s/p breast cancer. Had lumpectomy, chemo, rad, 5 yrs of tamoxifen, and 5 yrs of letrozole. Not on any tx currently. Followed by onc yearly.   There is a FH of breast cancer in 2 mat grt aunts and pt. Pt did cancer genetic testing/BRCA through [facility] a few yrs ago. Not sure if BRCA vs panel testing done. There is no FH of ovarian cancer. The patient does do self-breast exams. She takes Vit D supp.  Colonoscopy: colonoscopy 8 years ago without abnormalities.  Repeat due after 10 years.   Tobacco use: The patient denies current or previous tobacco use. Alcohol use: none Exercise: not active  She does get adequate calcium and Vitamin D in her diet.  Labs with PCP.  Past Medical History:  Diagnosis Date  . Breast cancer ([place]) 2004   chemo/ radiation  . Dysrhythmia    palpitations  . Hyperlipidemia   . LGSIL on Pap smear of cervix   . Personal history of chemotherapy   . Personal history of radiation therapy   . Prediabetes   . Seasonal allergies   . UTI  (urinary tract infection)     Past Surgical History:  Procedure Laterality Date  . BREAST BIOPSY Right 07/2002   positive  . BREAST BIOPSY Right 2010   negative  . BREAST LUMPECTOMY Right 08/09/2002   positive for breast cancer  . BROW LIFT Bilateral 06/25/2017   Procedure: BLEPHAROPLASTY UPPER EYELID WITH EXCESS SKIN;  Surgeon: Karle Starch, MD;  Location: [facility];  Service: Ophthalmology;  Laterality: Bilateral;  . COLONOSCOPY  03/31/2010  . LEEP  04/10/1993  . OTHER SURGICAL HISTORY  06/2017   eyelid, took off excessive skin    Family History  Problem Relation Age of Onset  . Hyperlipidemia Mother   . Heart disease Mother   . Thyroid disease Mother   . Hyperlipidemia Father   . Heart disease Father 67       MI- died at 2 ? SCD  . Hypertension Father   . Breast cancer Other 60  . Breast cancer Maternal Aunt 62  . Colon cancer Neg Hx   . Ovarian cancer Neg Hx   . Melanoma Neg Hx     Social History   Socioeconomic History  . Marital status: Married    Spouse name: Not on file  . Number of children: Not on file  . Years of education: Not on file  . Highest education level: Not on file  Occupational History  .  Not on file  Social Needs  . Financial resource strain: Not on file  . Food insecurity:    Worry: Not on file    Inability: Not on file  . Transportation needs:    Medical: Not on file    Non-medical: Not on file  Tobacco Use  . Smoking status: Never Smoker  . Smokeless tobacco: Never Used  Substance and Sexual Activity  . Alcohol use: Yes    Alcohol/week: 0.0 standard drinks    Frequency: Never  . Drug use: No  . Sexual activity: Yes    Birth control/protection: Post-menopausal  Lifestyle  . Physical activity:    Days per week: Not on file    Minutes per session: Not on file  . Stress: Not on file  Relationships  . Social connections:    Talks on phone: Not on file    Gets together: Not on file    Attends religious service: Not  on file    Active member of club or organization: Not on file    Attends meetings of clubs or organizations: Not on file    Relationship status: Not on file  . Intimate partner violence:    Fear of current or ex partner: Not on file    Emotionally abused: Not on file    Physically abused: Not on file    Forced sexual activity: Not on file  Other Topics Concern  . Not on file  Social History Narrative   From [place]   Works for telecommunication    1 daughter             Current Meds  Medication Sig  . Ascorbic Acid (VITAMIN C) 1000 MG tablet Take 1,000 mg by mouth daily.  Marland Kitchen BIOTIN PO Take by mouth daily.   . Cholecalciferol (VITAMIN D3 PO) Take by mouth daily.   Marland Kitchen MEGARED OMEGA-3 KRILL OIL PO Take by mouth daily.   . Milk Thistle 175 MG CAPS Take by mouth daily.  . rosuvastatin (CRESTOR) 10 MG tablet TAKE 1 TABLET BY MOUTH EVERY DAY  . VIT B12-METHIONINE-INOS-CHOL IM daily.       ROS:  Review of Systems  Constitutional: Negative for fatigue, fever and unexpected weight change.  Respiratory: Negative for cough, shortness of breath and wheezing.   Cardiovascular: Negative for chest pain, palpitations and leg swelling.  Gastrointestinal: Negative for blood in stool, constipation, diarrhea, nausea and vomiting.  Endocrine: Negative for cold intolerance, heat intolerance and polyuria.  Genitourinary: Negative for dyspareunia, dysuria, flank pain, frequency, genital sores, hematuria, menstrual problem, pelvic pain, urgency, vaginal bleeding, vaginal discharge and vaginal pain.  Musculoskeletal: Negative for back pain, joint swelling and myalgias.  Skin: Negative for rash.  Neurological: Negative for dizziness, syncope, light-headedness, numbness and headaches.  Hematological: Negative for adenopathy.  Psychiatric/Behavioral: Negative for agitation, confusion, sleep disturbance and suicidal ideas. The patient is not nervous/anxious.      Objective: BP 110/80   Pulse 96    Ht 5' 8"  (1.727 m)   Wt 202 lb (91.6 kg)   BMI 30.71 kg/m    Physical Exam Constitutional:      Appearance: She is well-developed.  Genitourinary:     Vulva, vagina, uterus, right adnexa and left adnexa normal.     No vulval lesion or tenderness noted.     No vaginal discharge, erythema or tenderness.     No cervical motion tenderness or polyp.     Uterus is not enlarged or tender.  No right or left adnexal mass present.     Right adnexa not tender.     Left adnexa not tender.  Neck:     Musculoskeletal: Normal range of motion.     Thyroid: No thyromegaly.  Cardiovascular:     Rate and Rhythm: Normal rate and regular rhythm.     Heart sounds: Normal heart sounds. No murmur.  Pulmonary:     Effort: Pulmonary effort is normal.     Breath sounds: Normal breath sounds.  Chest:     Breasts:        Right: No mass, nipple discharge, skin change or tenderness.        Left: No mass, nipple discharge, skin change or tenderness.  Abdominal:     Palpations: Abdomen is soft.     Tenderness: There is no abdominal tenderness. There is no guarding.  Musculoskeletal: Normal range of motion.  Neurological:     General: No focal deficit present.     Mental Status: She is alert and oriented to person, place, and time.     Cranial Nerves: No cranial nerve deficit.  Skin:    General: Skin is warm and dry.  Psychiatric:        Mood and Affect: Mood normal.        Behavior: Behavior normal.        Thought Content: Thought content normal.        Judgment: Judgment normal.  Vitals signs reviewed.     Assessment/Plan:  Encounter for annual routine gynecological examination  Cervical cancer screening - Plan: Cytology - PAP  Screening for HPV (human papillomavirus) - Plan: Cytology - PAP  Cervical dysplasia - Repeat pap today. Will call with results.   Screening for breast cancer - Pt current on mammo. Followed by onc. Discussed updated MyRisk testing if not done in past. Pt to  check wiht oncology.         GYN counsel mammography screening, adequate intake of calcium and vitamin D, diet and exercise    F/U  Return in about 1 year (around 03/24/2019).  Coyle Stordahl B. Jony Ladnier, PA-C 03/24/2018 8:58 AM

## 2018-03-24 ENCOUNTER — Encounter: Payer: Self-pay | Admitting: Obstetrics and Gynecology

## 2018-03-24 ENCOUNTER — Ambulatory Visit (INDEPENDENT_AMBULATORY_CARE_PROVIDER_SITE_OTHER): Payer: 59 | Admitting: Obstetrics and Gynecology

## 2018-03-24 ENCOUNTER — Other Ambulatory Visit (HOSPITAL_COMMUNITY)
Admission: RE | Admit: 2018-03-24 | Discharge: 2018-03-24 | Disposition: A | Discharge status: Home / Self Care | Payer: 59 | Source: Ambulatory Visit | Attending: Obstetrics and Gynecology | Admitting: Obstetrics and Gynecology

## 2018-03-24 ENCOUNTER — Other Ambulatory Visit: Payer: Self-pay

## 2018-03-24 VITALS — BP 110/80 | HR 96 | Ht 68.0 in | Wt 202.0 lb

## 2018-03-24 DIAGNOSIS — N879 Dysplasia of cervix uteri, unspecified: Secondary | ICD-10-CM

## 2018-03-24 DIAGNOSIS — Z124 Encounter for screening for malignant neoplasm of cervix: Secondary | ICD-10-CM

## 2018-03-24 DIAGNOSIS — Z1239 Encounter for other screening for malignant neoplasm of breast: Secondary | ICD-10-CM

## 2018-03-24 DIAGNOSIS — Z01419 Encounter for gynecological examination (general) (routine) without abnormal findings: Secondary | ICD-10-CM

## 2018-03-24 DIAGNOSIS — Z1151 Encounter for screening for human papillomavirus (HPV): Secondary | ICD-10-CM | POA: Insufficient documentation

## 2018-03-24 NOTE — Patient Instructions (Signed)
I value your feedback and entrusting us with your care. If you get a [facility] patient survey, I would appreciate you taking the time to let us know about your experience today. Thank you! 

## 2018-03-27 ENCOUNTER — Encounter: Payer: Self-pay | Admitting: Obstetrics and Gynecology

## 2018-03-27 LAB — CYTOLOGY - PAP
Diagnosis: UNDETERMINED — AB
HPV: NOT DETECTED

## 2018-06-01 ENCOUNTER — Other Ambulatory Visit: Payer: Self-pay | Admitting: Cardiovascular Disease

## 2018-06-28 ENCOUNTER — Encounter: Payer: Self-pay | Admitting: Family

## 2018-07-10 ENCOUNTER — Encounter: Payer: Self-pay | Admitting: Emergency Medicine

## 2018-07-10 ENCOUNTER — Other Ambulatory Visit: Payer: Self-pay

## 2018-07-10 ENCOUNTER — Emergency Department: Payer: 59

## 2018-07-10 ENCOUNTER — Ambulatory Visit: Payer: Self-pay | Admitting: *Deleted

## 2018-07-10 ENCOUNTER — Emergency Department
Admission: EM | Admit: 2018-07-10 | Discharge: 2018-07-10 | Disposition: A | Discharge status: Home / Self Care | Payer: 59 | Attending: Emergency Medicine | Admitting: Emergency Medicine

## 2018-07-10 DIAGNOSIS — G510 Bell's palsy: Secondary | ICD-10-CM | POA: Diagnosis not present

## 2018-07-10 DIAGNOSIS — Z79899 Other long term (current) drug therapy: Secondary | ICD-10-CM | POA: Diagnosis not present

## 2018-07-10 DIAGNOSIS — R29810 Facial weakness: Secondary | ICD-10-CM | POA: Diagnosis present

## 2018-07-10 LAB — COMPREHENSIVE METABOLIC PANEL
ALT: 34 U/L (ref 0–44)
AST: 26 U/L (ref 15–41)
Albumin: 4.7 g/dL (ref 3.5–5.0)
Alkaline Phosphatase: 71 U/L (ref 38–126)
Anion gap: 14 (ref 5–15)
BUN: 11 mg/dL (ref 6–20)
CO2: 22 mmol/L (ref 22–32)
Calcium: 9.9 mg/dL (ref 8.9–10.3)
Chloride: 105 mmol/L (ref 98–111)
Creatinine, Ser: 0.94 mg/dL (ref 0.44–1.00)
GFR calc Af Amer: >60 mL/min (ref >60)
GFR calc non Af Amer: >60 mL/min (ref >60)
Glucose, Bld: 145 mg/dL — ABNORMAL HIGH (ref 70–99)
Potassium: 3.7 mmol/L (ref 3.5–5.1)
Sodium: 141 mmol/L (ref 135–145)
Total Bilirubin: 0.7 mg/dL (ref 0.3–1.2)
Total Protein: 8.9 g/dL — ABNORMAL HIGH (ref 6.5–8.1)

## 2018-07-10 LAB — GLUCOSE, CAPILLARY: Glucose-Capillary: 152 mg/dL — ABNORMAL HIGH (ref 70–99)

## 2018-07-10 LAB — DIFFERENTIAL
Abs Immature Granulocytes: 0.03 10*3/uL (ref 0.00–0.07)
Basophils Absolute: 0.1 10*3/uL (ref 0.0–0.1)
Basophils Relative: 1 %
Eosinophils Absolute: 0 10*3/uL (ref 0.0–0.5)
Eosinophils Relative: 0 %
Immature Granulocytes: 0 %
Lymphocytes Relative: 14 %
Lymphs Abs: 1.6 10*3/uL (ref 0.7–4.0)
Monocytes Absolute: 0.5 10*3/uL (ref 0.1–1.0)
Monocytes Relative: 4 %
Neutro Abs: 9.3 10*3/uL — ABNORMAL HIGH (ref 1.7–7.7)
Neutrophils Relative %: 81 %

## 2018-07-10 LAB — CBC
HCT: 47.9 % — ABNORMAL HIGH (ref 36.0–46.0)
Hemoglobin: 15.5 g/dL — ABNORMAL HIGH (ref 12.0–15.0)
MCH: 29 pg (ref 26.0–34.0)
MCHC: 32.4 g/dL (ref 30.0–36.0)
MCV: 89.7 fL (ref 80.0–100.0)
Platelets: 348 10*3/uL (ref 150–400)
RBC: 5.34 MIL/uL — ABNORMAL HIGH (ref 3.87–5.11)
RDW: 13.9 % (ref 11.5–15.5)
WBC: 11.6 10*3/uL — ABNORMAL HIGH (ref 4.0–10.5)
nRBC: 0 % (ref 0.0–0.2)

## 2018-07-10 MED ORDER — PREDNISONE 20 MG PO TABS: 40.0000 mg | ORAL_TABLET | Freq: Every day | ORAL | 0 refills | Status: DC

## 2018-07-10 MED ORDER — VALACYCLOVIR HCL 1 G PO TABS: 1000.0000 mg | ORAL_TABLET | Freq: Three times a day (TID) | ORAL | 0 refills | Status: DC

## 2018-07-10 MED ORDER — PREDNISONE 20 MG PO TABS
40.0000 mg | ORAL_TABLET | Freq: Once | ORAL | Status: AC
  Administered 2018-07-10: 40 mg via ORAL
  Filled 2018-07-10: qty 2

## 2018-07-10 MED ORDER — VALACYCLOVIR HCL 500 MG PO TABS
1000.0000 mg | ORAL_TABLET | ORAL | Status: AC
  Administered 2018-07-10: 1000 mg via ORAL
  Filled 2018-07-10: qty 2

## 2018-07-10 MED ORDER — ACETAMINOPHEN 500 MG PO TABS
1000.0000 mg | ORAL_TABLET | ORAL | Status: AC
  Administered 2018-07-10: 22:00:00 1000 mg via ORAL
  Filled 2018-07-10: qty 2

## 2018-07-10 NOTE — ED Notes (Signed)
Patient now reports that at 24, she "felt weird" and like her left eye wouldn't close, however after looking in the mirror she said her eye looked like it was closed. Patient reports at 1500, she got the same feeling and noticed that the left side of her face was drooping and left eye would not shut

## 2018-07-10 NOTE — Consult Note (Signed)
[facility] Tele[facility]urology Consult Services   Date of Service:   07/10/2018 17:58:21  Impression:     .  Bell's palsy rule out brainstem stroke  Comments/Sign-Out: I discussed the case in detail with the patient. Her exam and history suggestive of Bell's palsy. Because she had this in the past also, would suggest getting an MRI of the head, MRA of the head. I would also suggest checking her ANA, fasting blood glucose, Lyme antibody and angiotensin-converting enzyme levels. She should be on Valtrex and prednisone and follow-up with neurology as an outpatient once the MRIs done  Metrics: Last Known Well: 07/10/2018 11:00:00 [facility] Notification Time: 07/10/2018 17:58:21 Arrival Time: 07/10/2018 17:05:00 Stamp Time: 07/10/2018 17:58:21 Time First Login Attempt: 07/10/2018 18:01:31 Video Start Time: 07/10/2018 18:01:31  Symptoms: Facial drooping NIHSS Start Assessment Time: 07/10/2018 18:06:07 Patient is not a candidate for tPA. Patient was not deemed candidate for tPA thrombolytics because of Last Well Known Above 4.5 Hours. Video End Time: 07/10/2018 18:18:16  CT head showed no acute hemorrhage or acute core infarct.  Clinical Presentation is not Suggestive of Large Vessel Occlusive Disease  ED Physician notified of diagnostic impression and management plan on 07/10/2018 18:19:05  Our recommendations are outlined below.  Recommendations:     .  Activate Stroke Protocol Admission/Order Set     .  Stroke/Telemetry Floor     .  Neuro Checks     .  Bedside Swallow Eval     .  DVT Prophylaxis     .  IV Fluids, Normal Saline     .  Head of Bed 30 Degrees     .  Euglycemia and Avoid Hyperthermia (PRN Acetaminophen)     .  Valtrex 1000 mg 3 times a day for 1 week     .  Prednisone 40 mg daily for 5 days    Sign Out:     .  Discussed with Emergency Department  Provider    ------------------------------------------------------------------------------  History of Present Illness: Patient is a 58 year old Female.  Patient was brought by private transportation with symptoms of Facial drooping  Extremely pleasant 57 year old female with history of hyperlipidemia and previous history of Bell's palsy came to the hospital because of progressive drooping of the face on the left side. This started this morning. She reports she noticed this progressively getting worse and eventually came to the hospital. She denies any problem with her vision. Denies any swallowing problems. Does report some taste change. Denies any gait or balance issues. She does report a headache that she has for past 2 days in the back of her neck.  Last seen normal was within 4.5 hours. There is no history of hemorrhagic complications or intracranial hemorrhage. There is no history of Recent Anticoagulants. There is no history of recent major surgery. There is no history of recent stroke.  Examination: 1A: Level of Consciousness - Alert; keenly responsive + 0 1B: Ask Month and Age - Both Questions Right + 0 1C: Blink Eyes & Squeeze Hands - Performs Both Tasks + 0 2: Test Horizontal Extraocular Movements - Normal + 0 3: Test Visual Fields - No Visual Loss + 0 4: Test Facial Palsy (Use Grimace if Obtunded) - Partial paralysis (lower face) + 2 5A: Test Left Arm Motor Drift - No Drift for 10 Seconds + 0 5B: Test Right Arm Motor Drift - No Drift for 10 Seconds + 0 6A: Test Left Leg Motor Drift - No Drift for 5  Seconds + 0 6B: Test Right Leg Motor Drift - No Drift for 5 Seconds + 0 7: Test Limb Ataxia (FNF/Heel-Shin) - No Ataxia + 0 8: Test Sensation - Normal; No sensory loss + 0 9: Test Language/Aphasia - Normal; No aphasia + 0 10: Test Dysarthria - Normal + 0 11: Test Extinction/Inattention - No abnormality + 0  NIHSS Score: 2  Patient/Family was informed the Neurology Consult  would happen via TeleHealth consult by way of interactive audio and video telecommunications and consented to receiving care in this manner.  Due to the immediate potential for life-threatening deterioration due to underlying acute neurologic illness, I spent 35 minutes providing critical care. This time includes time for face to face visit via telemedicine, review of medical records, imaging studies and discussion of findings with providers, the patient and/or family.   Dr Faustino Congress   [facility] [phone]  Case [id number]

## 2018-07-10 NOTE — Telephone Encounter (Signed)
Patient calls with droop left eyelid and numbness at the jawline on the left side of the face. No changes in speech/vision. No CP/SOB/Fever.Suspect Bell's palsy as she has had it in the past. Had headache for the past 2 days. [facility] at this time. No travels and no known exposures.  Reason for Disposition . Bell's palsy suspected (i.e., weakness on only one side of the face, developing over hours to days, no other symptoms)  Answer Assessment - Initial Assessment Questions 1. SYMPTOM: "What is the main symptom you are concerned about?" (e.g., weakness, numbness)     Left eye lid is drooping and will not close all the way 2. ONSET: "When did this start?" (minutes, hours, days; while sleeping)     today 3. LAST NORMAL: "When was the last time you were normal (no symptoms)?"     yesterday 4. PATTERN "Does this come and go, or has it been constant since it started?"  "Is it present now?"     constant 5. CARDIAC SYMPTOMS: "Have you had any of the following symptoms: chest pain, difficulty breathing, palpitations?"     non 6. NEUROLOGIC SYMPTOMS: "Have you had any of the following symptoms: headache, dizziness, vision loss, double vision, changes in speech  none 7. OTHER SYMPTOMS: "Do you have any other symptoms?"     no 8. PREGNANCY: "Is there any chance you are pregnant?" "When was your last menstrual period?"     Na  Protocols used: NEUROLOGIC DEFICIT-A-AH

## 2018-07-10 NOTE — ED Notes (Signed)
LKW 3pm. Pt c/o headache within the last 2 days. At 3pm pt st she looked in the mirror because she "felt weird like her left eye would not close shut). Pt  noticed left face drooping and "called my doctor and told me to come here". Tele medicine at bedside at this time with neurologist MD Faustino Congress

## 2018-07-10 NOTE — ED Triage Notes (Signed)
Patient reports having cluster headaches for the last two days. States today at approximately 30, she noticed a significant droop on the left side of her face. Patient denies weakness, numbness, or speech changes. Patient with equal grips and sensation noted in triage. Patient unable to close left eye completely or raise left eyebrow. Patient reports history of Bell's palsy approximately 20 years ago.

## 2018-07-10 NOTE — ED Provider Notes (Signed)
[facility] Emergency Department Provider Note   ____________________________________________   First MD Initiated Contact with Patient 07/10/18 1801     (approximate)  I have reviewed the triage vital signs and the nursing notes.   HISTORY  Chief Complaint Facial Droop    HPI Teresa Franco is a 59 y.o. female here for evaluation of atrial droop difficulty closing left eye  About 11 AM today patient knows she is having trouble closing her left eye.  Also some drooping of left face.  She reports 2 days ago she had a fairly severe throbbing headache.  This is since gone away, and then she started various experiences droop.  She reports has been under a lot of stress lately and attributes it to this.  She reports about 20 years ago she had the same, throbbing headache and then developed a "Bell's palsy" but seem the same.  She is not had a recent infection.  No fevers no chills.  No exposure to coronavirus.  No cough.  Denies pregnancy  No numbness or weakness in the arms or legs.  No difficulty speaking.  Denies that her face feels numb.  She is able to close and open the left eye and it does not feel dry, but does report it is difficult to open and close in the left side of her face slightly droopy.  No facial rash.  No hearing loss   Past Medical History:  Diagnosis Date  . Breast cancer ([place]) 2004   chemo/ radiation  . Dysrhythmia    palpitations  . Hyperlipidemia   . LGSIL on Pap smear of cervix   . Personal history of chemotherapy   . Personal history of radiation therapy   . Prediabetes   . Seasonal allergies   . UTI (urinary tract infection)     Patient Active Problem List   Diagnosis Date Noted  . Hives 03/05/2018  . Pelvic cramping 03/05/2018  . Elevated serum protein level 10/03/2017  . Sinus tachycardia 01/21/2017  . Family history of cardiovascular disorder 04/18/2016  . HLD (hyperlipidemia) 04/18/2016  . Routine physical  examination 02/23/2016  . History of right breast cancer 10/21/2015  . Breast cancer, right ([place]) 01/08/2002    Past Surgical History:  Procedure Laterality Date  . BREAST BIOPSY Right 07/2002   positive  . BREAST BIOPSY Right 2010   negative  . BREAST LUMPECTOMY Right 08/09/2002   positive for breast cancer  . BROW LIFT Bilateral 06/25/2017   Procedure: BLEPHAROPLASTY UPPER EYELID WITH EXCESS SKIN;  Surgeon: Karle Starch, MD;  Location: [place];  Service: Ophthalmology;  Laterality: Bilateral;  . COLONOSCOPY  03/31/2010  . LEEP  04/10/1993  . OTHER SURGICAL HISTORY  06/2017   eyelid, took off excessive skin    Prior to Admission medications   Medication Sig Start Date End Date Taking? Authorizing Provider  Ascorbic Acid (VITAMIN C) 1000 MG tablet Take 1,000 mg by mouth daily.    [provider]  BIOTIN PO Take by mouth daily.     [provider]  Cholecalciferol (VITAMIN D3 PO) Take by mouth daily.     [provider]  MEGARED OMEGA-3 KRILL OIL PO Take by mouth daily.     [provider]  Milk Thistle 175 MG CAPS Take by mouth daily.    [provider]  predniSONE (DELTASONE) 20 MG tablet Take 2 tablets (40 mg total) by mouth daily. 07/10/18   Delman Kitten, MD  rosuvastatin (CRESTOR) 10 MG tablet TAKE 1 TABLET BY MOUTH EVERY DAY 06/03/18   Minna Merritts, MD  valACYclovir (VALTREX) 1000 MG tablet Take 1 tablet (1,000 mg total) by mouth 3 (three) times daily. 07/10/18   Delman Kitten, MD  VIT B12-METHIONINE-INOS-CHOL IM daily.     [provider]    Allergies Sulfa antibiotics  Family History  Problem Relation Age of Onset  . Hyperlipidemia Mother   . Heart disease Mother   . Thyroid disease Mother   . Hyperlipidemia Father   . Heart disease Father 54       MI- died at 25 ? SCD  . Hypertension Father   . Breast cancer Other 60  . Breast cancer Maternal Aunt 58  . Colon cancer Neg Hx   . Ovarian cancer Neg Hx    . Melanoma Neg Hx     Social History Social History   Tobacco Use  . Smoking status: Never Smoker  . Smokeless tobacco: Never Used  Substance Use Topics  . Alcohol use: Yes    Alcohol/week: 0.0 standard drinks    Frequency: Never  . Drug use: No    Review of Systems Constitutional: No fever/chills Eyes: No visual changes. ENT: No sore throat. Cardiovascular: Denies chest pain. Respiratory: Denies shortness of breath. Gastrointestinal: No abdominal pain.   Genitourinary: Negative for dysuria. Musculoskeletal: Negative for back pain. Skin: Negative for rash. Neurological: Negative for areas of focal weakness or numbness except for some weakness left side of the face.    ____________________________________________   PHYSICAL EXAM:  VITAL SIGNS: ED Triage Vitals [07/10/18 1714]  Enc Vitals Group     BP (!) 141/80     Pulse Rate (!) 148     Resp 16     Temp 98.4 F (36.9 C)     Temp Source Oral     SpO2 98 %     Weight 205 lb (93 kg)     Height 5' 7"  (1.702 m)     Head Circumference      Peak Flow      Pain Score 0     Pain Loc      Pain Edu?      Excl. in [place]?     Constitutional: Alert and oriented. Well appearing and in no acute distress. Eyes: Conjunctivae are normal. Head: Atraumatic. Nose: No congestion/rhinnorhea. Mouth/Throat: Mucous membranes are moist. Neck: No stridor.  Cardiovascular: Normal rate, regular rhythm. Grossly normal heart sounds.  Good peripheral circulation. Respiratory: Normal respiratory effort.  No retractions. Lungs CTAB. Gastrointestinal: Soft and nontender. No distention. Musculoskeletal: No lower extremity tenderness nor edema. Neurologic:  Normal speech and language. No gross focal neurologic deficits are appreciated for left 7th nerve palsy.  NIH score equals 2 for left-sided facial weakness, partial. Skin:  Skin is warm, dry and intact. No rash noted. Psychiatric: Mood and affect are normal. Speech and behavior are  normal.  ____________________________________________   LABS (all labs ordered are listed, but only abnormal results are displayed)  Labs Reviewed  CBC - Abnormal; Notable for the following components:      Result Value   WBC 11.6 (*)    RBC 5.34 (*)    Hemoglobin 15.5 (*)    HCT 47.9 (*)    All other components within normal limits  DIFFERENTIAL - Abnormal; Notable for the following components:   Neutro Abs 9.3 (*)    All other components within normal limits  COMPREHENSIVE METABOLIC PANEL -  Abnormal; Notable for the following components:   Glucose, Bld 145 (*)    Total Protein 8.9 (*)    All other components within normal limits  GLUCOSE, CAPILLARY - Abnormal; Notable for the following components:   Glucose-Capillary 152 (*)    All other components within normal limits  CBG MONITORING, ED   ____________________________________________  EKG  Reviewed entered by me at 1710 Heart rate 115 QRS 60 QTc 500 Sinus tachycardia.  Of note, patient's heart rate has improved.  She reports she was very nervous, and after discussing with her heart rate comes down nicely to about the 100 Mark.  Suspect sinus tachycardia secondary to being very anxious ____________________________________________  RADIOLOGY     MRI of the brain reviewed negative for acute ____________________________________________   PROCEDURES  Procedure(s) performed: None  Procedures  Critical Care performed: Yes  CRITICAL CARE Performed by: Delman Kitten   Total critical care time: 25 minutes  Critical care time was exclusive of separately billable procedures and treating other patients.  Critical care was necessary to treat or prevent imminent or life-threatening deterioration.  Critical care was time spent personally by me on the following activities: development of treatment plan with patient and/or surrogate as well as nursing, discussions with consultants, evaluation of patient's response to  treatment, examination of patient, obtaining history from patient or surrogate, ordering and performing treatments and interventions, ordering and review of laboratory studies, ordering and review of radiographic studies, pulse oximetry and re-evaluation of patient's condition.  Patient required rapid evaluation for concerns acute left-sided facial weakness.  Evaluated in conjunction with stroke neurologist.  I do not see signs or symptoms that would be consistent with an acute stroke rather this is most likely a Bell's palsy.  No signs or symptoms of LVO. VAN negative.  In discussion with neurologist, will develop plan of care as recommended by neurology is that if her MRI is negative would recommend discharge to home with Valtrex and prednisone treatment for Bell's palsy.  ____________________________________________   INITIAL IMPRESSION / ASSESSMENT AND PLAN / ED COURSE  Pertinent labs & imaging results that were available during my care of the patient were reviewed by me and considered in my medical decision making (see chart for details).   Patient presents for evaluation of left-sided facial weakness.  This is following 2-day headache that is resolved on its own.  She is alert, neurologically intact except for isolated left-sided 7th cranial nerve palsy.  Similar history 20 years ago.  No signs or symptoms suggest acute stroke.  Will obtain MRI to exclude stroke but I find this highly unlikely.  Headache resolved.  CT of the head negative.  Headache similar to that experienced many years ago where she also developed Bell's palsy after.  ----------------------------------------- 12:09 AM on 07/11/2018 -----------------------------------------  MRI negative for acute.  Consistent with Bell's palsy.  Discussed with neurologist, advised discharge with Valtrex and prednisone with outpatient follow-up.  Patient comfortable with this plan.  Also discussed rewetting drops and expected course of  Bell's palsy.  Return precautions and treatment recommendations and follow-up discussed with the patient who is agreeable with the plan.       ____________________________________________   FINAL CLINICAL IMPRESSION(S) / ED DIAGNOSES  Final diagnoses:  Bell's palsy        Note:  This document was prepared using Dragon voice recognition software and may include unintentional dictation errors       Delman Kitten, MD 07/11/18 0010

## 2018-07-10 NOTE — ED Notes (Signed)
Pt does not c/o HA a this time

## 2018-07-10 NOTE — ED Notes (Signed)
Patient transported to MRI 

## 2018-07-10 NOTE — ED Notes (Signed)
ED Provider Quale at bedside at this time.

## 2018-07-14 NOTE — Telephone Encounter (Signed)
Seen in ED 07/10/18

## 2018-09-06 ENCOUNTER — Other Ambulatory Visit: Payer: Self-pay | Admitting: Cardiovascular Disease

## 2018-09-08 NOTE — Telephone Encounter (Signed)
Can you please try to contact patient for an appointment.  Can be virtual or in office.  Patient will need an appointment for further refills or she will need to contact PCP for refills.  Thanks!

## 2018-10-06 ENCOUNTER — Ambulatory Visit
Admission: RE | Admit: 2018-10-06 | Discharge: 2018-10-06 | Disposition: A | Discharge status: Home / Self Care | Payer: 59 | Source: Ambulatory Visit | Attending: Hematology and Oncology | Admitting: Hematology and Oncology

## 2018-10-06 DIAGNOSIS — C50911 Malignant neoplasm of unspecified site of right female breast: Secondary | ICD-10-CM

## 2018-10-06 DIAGNOSIS — Z170 Estrogen receptor positive status [ER+]: Secondary | ICD-10-CM | POA: Insufficient documentation

## 2018-10-06 DIAGNOSIS — Z1231 Encounter for screening mammogram for malignant neoplasm of breast: Secondary | ICD-10-CM | POA: Insufficient documentation

## 2018-10-07 NOTE — Progress Notes (Signed)
[facility]  [address], [address] [place], [place] [address] Phone: [phone]  Fax: [phone]   Clinic Day:  10/09/2018  Referring physician: Burnard Hawthorne, FNP  Chief Complaint: Teresa Franco is a 58 y.o. female with a history of Her2/neu + right breast cancer who is seen for 1 year assessment.  HPI: The patient was last seen in the medical oncology clinic on 10/03/2017. At that time, she was doing well. She had rare hot flashes. Exam was stable. CBC was normal. CA27.29 was 9.4. 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  IgA and kappa and lambda typing were increased.  IgA was 558.  Kappa free light chain 24.9, lamda free light chains 27.7. Kappa, and ratio 0.90 (normal).   Patient was seen by Ihor Gully, RN on 07/10/2018.  She had symptoms related to Bell's palsy. Her left eye drooped and would not close completely. She had numbness at the jaw line on the left side of her face. She denied any chest pain, palpitations, or difficulty breathing. She denied any headaches, dizziness, vision loss, and changes in speech. She was referred to the ER.   She was seen in the [facility] ER on 07/10/2018 for facial droop. Head CT and head MRI were normal. Dr. Delman Kitten did not see any signs or symptoms consistent with an acute stroke but it was likely Bell's palsy.  Dr. Benancio Deeds, neurologist, was consulted.  Recommendation was for a head MRI, MRA as well as labs (ANA, fasting glucose, Lyme antibody and ACE levels).  Valacyclovir and prednisone with follow-up with neurology as an outpatient was also recommended.  Bilateral screening mammogram on 10/06/2018 revealed no malignancy.   During the interim, she has felt "fine". She denies any fevers and sweats. She denies any bone pain. Her hot flashes are improving. She examines her breast monthly. Her pulse is 110 in the clinic today. She notes her pulse increases because of "white coat syndrome".   She notes  having Bell's palsy in her 60's with reoccurrence on 07/10/2018. Bell's palsy happened on the same side of her face (left). She denies any drooling or vision changes. She uses tear drops and wears any eye patch at night. She can operate a vehicle while recovering.   Past Medical History:  Diagnosis Date  . Breast cancer ([place]) 2004   chemo/ radiation  . Dysrhythmia    palpitations  . Hyperlipidemia   . LGSIL on Pap smear of cervix   . Personal history of chemotherapy   . Personal history of radiation therapy   . Prediabetes   . Seasonal allergies   . UTI (urinary tract infection)     Past Surgical History:  Procedure Laterality Date  . BREAST BIOPSY Right 07/2002   positive  . BREAST BIOPSY Right 2010   negative  . BREAST LUMPECTOMY Right 08/09/2002   positive for breast cancer  . BROW LIFT Bilateral 06/25/2017   Procedure: BLEPHAROPLASTY UPPER EYELID WITH EXCESS SKIN;  Surgeon: Karle Starch, MD;  Location: [place];  Service: Ophthalmology;  Laterality: Bilateral;  . COLONOSCOPY  03/31/2010  . LEEP  04/10/1993  . OTHER SURGICAL HISTORY  06/2017   eyelid, took off excessive skin    Family History  Problem Relation Age of Onset  . Hyperlipidemia Mother   . Heart disease Mother   . Thyroid disease Mother   . Hyperlipidemia Father   . Heart disease Father 33       MI-  died at 42 ? SCD  . Hypertension Father   . Breast cancer Other 60  . Breast cancer Maternal Aunt 64  . Colon cancer Neg Hx   . Ovarian cancer Neg Hx   . Melanoma Neg Hx     Social History:  reports that she has never smoked. She has never used smokeless tobacco. She reports current alcohol use. She reports that she does not use drugs.  Patient has one daughter (age 57). 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  She lives in [place].  The patient is alone today.  Allergies:  Allergies  Allergen Reactions  . Sulfa Antibiotics Rash    Current Medications: Current  Outpatient Medications  Medication Sig Dispense Refill  . Ascorbic Acid (VITAMIN C) 1000 MG tablet Take 1,000 mg by mouth daily.    Marland Kitchen BIOTIN PO Take by mouth daily.     . Cholecalciferol (VITAMIN D3 PO) Take by mouth daily.     Marland Kitchen MEGARED OMEGA-3 KRILL OIL PO Take by mouth daily.     . Milk Thistle 175 MG CAPS Take by mouth daily.    . rosuvastatin (CRESTOR) 10 MG tablet TAKE 1 TABLET BY MOUTH EVERY DAY 30 tablet 1  . VIT B12-METHIONINE-INOS-CHOL IM daily.     . predniSONE (DELTASONE) 20 MG tablet Take 2 tablets (40 mg total) by mouth daily. (Patient not taking: Reported on 10/09/2018) 8 tablet 0  . valACYclovir (VALTREX) 1000 MG tablet Take 1 tablet (1,000 mg total) by mouth 3 (three) times daily. (Patient not taking: Reported on 10/09/2018) 21 tablet 0   No current facility-administered medications for this visit.     Review of Systems  Constitutional: Positive for weight loss (3 pounds). Negative for chills, diaphoresis, fever and malaise/fatigue.       Feels "fine".  HENT: Negative.  Negative for congestion, ear discharge, ear pain, hearing loss, nosebleeds, sinus pain and sore throat.   Eyes: Negative.  Negative for blurred vision and double vision.  Respiratory: Negative.  Negative for cough, hemoptysis, sputum production and shortness of breath.   Cardiovascular: Negative.  Negative for chest pain, palpitations and leg swelling.  Gastrointestinal: Negative.  Negative for abdominal pain, blood in stool, constipation, diarrhea, melena, nausea and vomiting.  Genitourinary: Negative.  Negative for dysuria, frequency, hematuria and urgency.  Musculoskeletal: Negative.  Negative for back pain, joint pain, myalgias and neck pain.  Skin: Negative.  Negative for itching and rash.  Neurological: Negative for dizziness, tingling, sensory change, weakness and headaches.       Left sided Bell's palsy on 07/10/2018.  Endo/Heme/Allergies: Does not bruise/bleed easily.       Hot flashes are  improving.  Psychiatric/Behavioral: Negative.  Negative for depression and memory loss. The patient is not nervous/anxious and does not have insomnia.   All other systems reviewed and are negative.  Performance status (ECOG): 1  Vitals Blood pressure 121/90, pulse (!) 110, temperature (!) 97.4 F (36.3 C), temperature source Tympanic, resp. rate 18, height 5' 7"  (1.702 m), weight 199 lb 15.3 oz (90.7 kg), SpO2 100 %.  Physical Exam  Constitutional: She is oriented to person, place, and time. She appears well-developed and well-nourished. No distress.  HENT:  Head: Normocephalic and atraumatic.  Mouth/Throat: Oropharynx is clear and moist. No oropharyngeal exudate.  Shoulder length brown hair. Mask.  Eyes: Pupils are equal, round, and reactive to light. Conjunctivae and EOM are normal. No scleral icterus.  Glasses. Brown eyes.  Neck: Normal range  of motion. Neck supple. No JVD present.  Cardiovascular: Normal rate, regular rhythm and normal heart sounds.  No murmur heard. Pulmonary/Chest: Effort normal and breath sounds normal. No respiratory distress. She has no wheezes. She has no rales. She exhibits no tenderness.  RIGHT breast post-operative changes.  LEFT breast without masses, skin changes or nipple discharge.  Abdominal: Soft. Bowel sounds are normal. She exhibits no distension and no mass. There is no abdominal tenderness. There is no rebound and no guarding.  Musculoskeletal: Normal range of motion.        General: No tenderness or edema.  Lymphadenopathy:       Head (right side): No preauricular, no posterior auricular and no occipital adenopathy present.       Head (left side): No preauricular, no posterior auricular and no occipital adenopathy present.    She has no cervical adenopathy.    She has no axillary adenopathy.       Right: No inguinal and no supraclavicular adenopathy present.       Left: No inguinal and no supraclavicular adenopathy present.  Neurological: She  is alert and oriented to person, place, and time.  Left sided residual Bell's palsy.  Skin: Skin is warm and dry. No rash noted. She is not diaphoretic. No erythema. No pallor.  Psychiatric: She has a normal mood and affect. Her behavior is normal. Judgment and thought content normal.  Nursing note and vitals reviewed.   Appointment on 10/09/2018  Component Date Value Ref Range Status  . Sodium 10/09/2018 134* 135 - 145 mmol/L Final  . Potassium 10/09/2018 4.0  3.5 - 5.1 mmol/L Final  . Chloride 10/09/2018 104  98 - 111 mmol/L Final  . CO2 10/09/2018 23  22 - 32 mmol/L Final  . Glucose, Bld 10/09/2018 211* 70 - 99 mg/dL Final  . BUN 10/09/2018 15  6 - 20 mg/dL Final  . Creatinine, Ser 10/09/2018 0.90  0.44 - 1.00 mg/dL Final  . Calcium 10/09/2018 9.2  8.9 - 10.3 mg/dL Final  . Total Protein 10/09/2018 7.9  6.5 - 8.1 g/dL Final  . Albumin 10/09/2018 4.1  3.5 - 5.0 g/dL Final  . AST 10/09/2018 40  15 - 41 U/L Final  . ALT 10/09/2018 50* 0 - 44 U/L Final  . Alkaline Phosphatase 10/09/2018 84  38 - 126 U/L Final  . Total Bilirubin 10/09/2018 0.2* 0.3 - 1.2 mg/dL Final  . GFR calc non Af Amer 10/09/2018 >60  >60 mL/min Final  . GFR calc Af Amer 10/09/2018 >60  >60 mL/min Final  . Anion gap 10/09/2018 7  5 - 15 Final   Performed at [facility], [address], [place], [place] [address]  . WBC 10/09/2018 8.5  4.0 - 10.5 K/uL Final  . RBC 10/09/2018 4.65  3.87 - 5.11 MIL/uL Final  . Hemoglobin 10/09/2018 14.0  12.0 - 15.0 g/dL Final  . HCT 10/09/2018 42.4  36.0 - 46.0 % Final  . MCV 10/09/2018 91.2  80.0 - 100.0 fL Final  . MCH 10/09/2018 30.1  26.0 - 34.0 pg Final  . MCHC 10/09/2018 33.0  30.0 - 36.0 g/dL Final  . RDW 10/09/2018 14.1  11.5 - 15.5 % Final  . Platelets 10/09/2018 245  150 - 400 K/uL Final  . nRBC 10/09/2018 0.0  0.0 - 0.2 % Final  . Neutrophils Relative % 10/09/2018 65  % Final  . Neutro Abs 10/09/2018 5.6  1.7 - 7.7 K/uL Final  . Lymphocytes  Relative  10/09/2018 26  % Final  . Lymphs Abs 10/09/2018 2.2  0.7 - 4.0 K/uL Final  . Monocytes Relative 10/09/2018 5  % Final  . Monocytes Absolute 10/09/2018 0.4  0.1 - 1.0 K/uL Final  . Eosinophils Relative 10/09/2018 3  % Final  . Eosinophils Absolute 10/09/2018 0.2  0.0 - 0.5 K/uL Final  . Basophils Relative 10/09/2018 1  % Final  . Basophils Absolute 10/09/2018 0.1  0.0 - 0.1 K/uL Final  . Immature Granulocytes 10/09/2018 0  % Final  . Abs Immature Granulocytes 10/09/2018 0.03  0.00 - 0.07 K/uL Final   Performed at [facility], [address] [place], [place] [address]    Assessment:  Teresa Franco is a 58 y.o. female with Her2/neu + right breast cancer in 07/2012.  No pathology is available. Lymph nodes were positive.  Tumor was ER/PR positive and HER-2/neu positive.   She received neoadjuvant AC followed by Taxol and Herceptin.  Lumpectomy revealed a complete pathologic response. She received radiation in 03/2003.   She received tamoxifen from 02/2003 - 02/2008. She received her Herceptin from 05/2003 - 05/2004.  She received extended adjuvant hormonal therapy with Lupron and Femara from 03/2008 - 04/2013.  She tolerated her treatment well.  Bilateral screening mammogram on 10/06/2018 revealed no evidence of malignancy.  CA27.29 has been followed: 15.3 on 08/14/2011, 16.2 on 02/14/2012, 16.0 on 04/14/2013, 9.2 on 11/23/2013, 14.3 on 09/22/2014, 13.3 on 10/04/2016, 9.4 on 10/03/2017, and 11.5 on 10/09/2018.  She has a history of an elevated serum protein.  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  IgA and kappa and lambda typing were increased.  FLC ratio was normal.  She has a family history of hemochromatosis.   Iron studies on 10/19/2015 revealed an iron saturation of 18% and a ferritin of 125.  Symptomatically, she denies any breast concerns.  Exam reveals left sided Bell's palsy.  Plan: 1.   Labs today: CBC with diff, CMP, CA 27.29.  2.   Her2/neu + breast cancer             Clinically, she is doing well.  Exam reveals no evidence of recurrent disease.  Bilateral mammogram reveals no evidence of malignancy. 3.   Elevated serum protein             Review labs from last visit.  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 4.   Mild elevated LFTs  AST 40 and ALT 50 (0-44).  Etiology unclear.  Recheck LFTs in 2-3 weeks. 5.   Bell's palsy  Review interim events.  Neurology consult. 6.   Screening mammogram on 10/06/2019. 7.   RTC in 1 year for MD assessment, labs (CBC with diff, CMP, CA27.29) and review of mammogram.  I discussed the assessment and treatment plan with the patient.  The patient was provided an opportunity to ask questions and all were answered.  The patient agreed with the plan and demonstrated an understanding of the instructions.  The patient was advised to call back if the symptoms worsen or if the condition fails to improve as anticipated.  I provided 14 minutes of face-to-face time during this this encounter and > 50% was spent counseling as documented under my assessment and plan.    Lequita Asal, MD, PhD    10/09/2018, 10:56 AM  I, Selena Batten, am acting as scribe for [employer]. Mike Gip, MD, PhD.  I,  C. Mike Gip, MD, have reviewed the  above documentation for accuracy and completeness, and I agree with the above.

## 2018-10-09 ENCOUNTER — Encounter: Payer: Self-pay | Admitting: Hematology and Oncology

## 2018-10-09 ENCOUNTER — Ambulatory Visit: Payer: 59 | Admitting: Hematology and Oncology

## 2018-10-09 ENCOUNTER — Other Ambulatory Visit: Payer: Self-pay

## 2018-10-09 ENCOUNTER — Inpatient Hospital Stay: Payer: 59 | Attending: Hematology and Oncology

## 2018-10-09 ENCOUNTER — Inpatient Hospital Stay (HOSPITAL_BASED_OUTPATIENT_CLINIC_OR_DEPARTMENT_OTHER): Payer: 59 | Admitting: Hematology and Oncology

## 2018-10-09 ENCOUNTER — Other Ambulatory Visit: Payer: 59

## 2018-10-09 VITALS — BP 121/90 | HR 110 | Temp 97.4°F | Resp 18 | Ht 67.0 in | Wt 200.0 lb

## 2018-10-09 DIAGNOSIS — Z803 Family history of malignant neoplasm of breast: Secondary | ICD-10-CM | POA: Insufficient documentation

## 2018-10-09 DIAGNOSIS — Z923 Personal history of irradiation: Secondary | ICD-10-CM | POA: Diagnosis not present

## 2018-10-09 DIAGNOSIS — Z853 Personal history of malignant neoplasm of breast: Secondary | ICD-10-CM | POA: Diagnosis not present

## 2018-10-09 DIAGNOSIS — D890 Polyclonal hypergammaglobulinemia: Secondary | ICD-10-CM | POA: Insufficient documentation

## 2018-10-09 DIAGNOSIS — G510 Bell's palsy: Secondary | ICD-10-CM

## 2018-10-09 DIAGNOSIS — Z79899 Other long term (current) drug therapy: Secondary | ICD-10-CM | POA: Insufficient documentation

## 2018-10-09 DIAGNOSIS — C50911 Malignant neoplasm of unspecified site of right female breast: Secondary | ICD-10-CM

## 2018-10-09 DIAGNOSIS — Z9221 Personal history of antineoplastic chemotherapy: Secondary | ICD-10-CM | POA: Insufficient documentation

## 2018-10-09 DIAGNOSIS — Z170 Estrogen receptor positive status [ER+]: Secondary | ICD-10-CM | POA: Diagnosis not present

## 2018-10-09 DIAGNOSIS — R779 Abnormality of plasma protein, unspecified: Secondary | ICD-10-CM | POA: Diagnosis not present

## 2018-10-09 DIAGNOSIS — E785 Hyperlipidemia, unspecified: Secondary | ICD-10-CM | POA: Insufficient documentation

## 2018-10-09 DIAGNOSIS — R7989 Other specified abnormal findings of blood chemistry: Secondary | ICD-10-CM | POA: Diagnosis not present

## 2018-10-09 DIAGNOSIS — R232 Flushing: Secondary | ICD-10-CM | POA: Diagnosis not present

## 2018-10-09 LAB — CBC WITH DIFFERENTIAL/PLATELET
Abs Immature Granulocytes: 0.03 10*3/uL (ref 0.00–0.07)
Basophils Absolute: 0.1 10*3/uL (ref 0.0–0.1)
Basophils Relative: 1 %
Eosinophils Absolute: 0.2 10*3/uL (ref 0.0–0.5)
Eosinophils Relative: 3 %
HCT: 42.4 % (ref 36.0–46.0)
Hemoglobin: 14 g/dL (ref 12.0–15.0)
Immature Granulocytes: 0 %
Lymphocytes Relative: 26 %
Lymphs Abs: 2.2 10*3/uL (ref 0.7–4.0)
MCH: 30.1 pg (ref 26.0–34.0)
MCHC: 33 g/dL (ref 30.0–36.0)
MCV: 91.2 fL (ref 80.0–100.0)
Monocytes Absolute: 0.4 10*3/uL (ref 0.1–1.0)
Monocytes Relative: 5 %
Neutro Abs: 5.6 10*3/uL (ref 1.7–7.7)
Neutrophils Relative %: 65 %
Platelets: 245 10*3/uL (ref 150–400)
RBC: 4.65 MIL/uL (ref 3.87–5.11)
RDW: 14.1 % (ref 11.5–15.5)
WBC: 8.5 10*3/uL (ref 4.0–10.5)
nRBC: 0 % (ref 0.0–0.2)

## 2018-10-09 LAB — COMPREHENSIVE METABOLIC PANEL
ALT: 50 U/L — ABNORMAL HIGH (ref 0–44)
AST: 40 U/L (ref 15–41)
Albumin: 4.1 g/dL (ref 3.5–5.0)
Alkaline Phosphatase: 84 U/L (ref 38–126)
Anion gap: 7 (ref 5–15)
BUN: 15 mg/dL (ref 6–20)
CO2: 23 mmol/L (ref 22–32)
Calcium: 9.2 mg/dL (ref 8.9–10.3)
Chloride: 104 mmol/L (ref 98–111)
Creatinine, Ser: 0.9 mg/dL (ref 0.44–1.00)
GFR calc Af Amer: >60 mL/min (ref >60)
GFR calc non Af Amer: >60 mL/min (ref >60)
Glucose, Bld: 211 mg/dL — ABNORMAL HIGH (ref 70–99)
Potassium: 4 mmol/L (ref 3.5–5.1)
Sodium: 134 mmol/L — ABNORMAL LOW (ref 135–145)
Total Bilirubin: 0.2 mg/dL — ABNORMAL LOW (ref 0.3–1.2)
Total Protein: 7.9 g/dL (ref 6.5–8.1)

## 2018-10-09 NOTE — Progress Notes (Signed)
No new changes noted today 

## 2018-10-10 LAB — CANCER ANTIGEN 27.29: CA 27.29: 11.5 U/mL (ref 0.0–38.6)

## 2018-10-22 DIAGNOSIS — Z8669 Personal history of other diseases of the nervous system and sense organs: Secondary | ICD-10-CM | POA: Insufficient documentation

## 2018-10-23 ENCOUNTER — Other Ambulatory Visit: Payer: PRIVATE HEALTH INSURANCE

## 2018-10-27 ENCOUNTER — Ambulatory Visit: Payer: 59 | Admitting: Cardiovascular Disease

## 2018-10-30 ENCOUNTER — Inpatient Hospital Stay: Payer: 59

## 2018-10-30 ENCOUNTER — Other Ambulatory Visit: Payer: Self-pay

## 2018-10-30 DIAGNOSIS — C50911 Malignant neoplasm of unspecified site of right female breast: Secondary | ICD-10-CM

## 2018-10-30 DIAGNOSIS — D890 Polyclonal hypergammaglobulinemia: Secondary | ICD-10-CM | POA: Diagnosis not present

## 2018-10-30 LAB — COMPREHENSIVE METABOLIC PANEL
ALT: 55 U/L — ABNORMAL HIGH (ref 0–44)
AST: 47 U/L — ABNORMAL HIGH (ref 15–41)
Albumin: 4.3 g/dL (ref 3.5–5.0)
Alkaline Phosphatase: 71 U/L (ref 38–126)
Anion gap: 10 (ref 5–15)
BUN: 14 mg/dL (ref 6–20)
CO2: 25 mmol/L (ref 22–32)
Calcium: 9.3 mg/dL (ref 8.9–10.3)
Chloride: 101 mmol/L (ref 98–111)
Creatinine, Ser: 0.91 mg/dL (ref 0.44–1.00)
GFR calc Af Amer: >60 mL/min (ref >60)
GFR calc non Af Amer: >60 mL/min (ref >60)
Glucose, Bld: 123 mg/dL — ABNORMAL HIGH (ref 70–99)
Potassium: 3.9 mmol/L (ref 3.5–5.1)
Sodium: 136 mmol/L (ref 135–145)
Total Bilirubin: 0.7 mg/dL (ref 0.3–1.2)
Total Protein: 8.7 g/dL — ABNORMAL HIGH (ref 6.5–8.1)

## 2018-10-30 LAB — CBC WITH DIFFERENTIAL/PLATELET
Abs Immature Granulocytes: 0.02 10*3/uL (ref 0.00–0.07)
Basophils Absolute: 0.1 10*3/uL (ref 0.0–0.1)
Basophils Relative: 1 %
Eosinophils Absolute: 0.2 10*3/uL (ref 0.0–0.5)
Eosinophils Relative: 2 %
HCT: 44.5 % (ref 36.0–46.0)
Hemoglobin: 14.5 g/dL (ref 12.0–15.0)
Immature Granulocytes: 0 %
Lymphocytes Relative: 22 %
Lymphs Abs: 1.8 10*3/uL (ref 0.7–4.0)
MCH: 29.4 pg (ref 26.0–34.0)
MCHC: 32.6 g/dL (ref 30.0–36.0)
MCV: 90.1 fL (ref 80.0–100.0)
Monocytes Absolute: 0.4 10*3/uL (ref 0.1–1.0)
Monocytes Relative: 5 %
Neutro Abs: 5.7 10*3/uL (ref 1.7–7.7)
Neutrophils Relative %: 70 %
Platelets: 271 10*3/uL (ref 150–400)
RBC: 4.94 MIL/uL (ref 3.87–5.11)
RDW: 13.8 % (ref 11.5–15.5)
WBC: 8.2 10*3/uL (ref 4.0–10.5)
nRBC: 0 % (ref 0.0–0.2)

## 2018-10-31 LAB — CANCER ANTIGEN 27.29: CA 27.29: 8.2 U/mL (ref 0.0–38.6)

## 2018-11-02 ENCOUNTER — Other Ambulatory Visit: Payer: Self-pay | Admitting: Cardiovascular Disease

## 2018-11-03 ENCOUNTER — Telehealth: Payer: Self-pay

## 2018-11-03 NOTE — Telephone Encounter (Signed)
Spoke with the patient to inform her that LFT's was still elevated. Per Dr Mike Gip would like to repeat additional blood work and possible image of the liver. The patient was understanding and agreeable.

## 2018-11-03 NOTE — Telephone Encounter (Signed)
-----   Message from Lequita Asal, MD sent at 10/31/2018  4:57 PM EDT ----- Regarding: Please call patient  LFTs still slightly elevated.  We should recheck and perform and evaluation with additional blood work and possible imaging of the liver.  M ----- Message ----- From: Buel Ream, Lab In [place] Sent: 10/30/2018  10:36 AM EDT To: Lequita Asal, MD

## 2018-11-18 ENCOUNTER — Encounter: Payer: Self-pay | Admitting: Cardiovascular Disease

## 2018-11-18 ENCOUNTER — Other Ambulatory Visit: Payer: Self-pay

## 2018-11-18 ENCOUNTER — Ambulatory Visit (INDEPENDENT_AMBULATORY_CARE_PROVIDER_SITE_OTHER): Payer: 59 | Admitting: Cardiovascular Disease

## 2018-11-18 VITALS — BP 128/80 | HR 103 | Ht 68.0 in | Wt 199.0 lb

## 2018-11-18 DIAGNOSIS — R000 Tachycardia, unspecified: Secondary | ICD-10-CM | POA: Diagnosis not present

## 2018-11-18 DIAGNOSIS — I2510 Atherosclerotic heart disease of native coronary artery without angina pectoris: Secondary | ICD-10-CM | POA: Diagnosis not present

## 2018-11-18 DIAGNOSIS — E782 Mixed hyperlipidemia: Secondary | ICD-10-CM | POA: Diagnosis not present

## 2018-11-18 DIAGNOSIS — K7581 Nonalcoholic steatohepatitis (NASH): Secondary | ICD-10-CM

## 2018-11-18 NOTE — Patient Instructions (Addendum)
Medication Instructions:  No changes  If you need a refill on your cardiac medications before your next appointment, please call your pharmacy.    Lab work: Lipids and LFTs through [facility], fasting  If you have labs (blood work) drawn today and your tests are completely normal, you will receive your results only by: Marland Kitchen MyChart Message (if you have MyChart) OR . A paper copy in the mail If you have any lab test that is abnormal or we need to change your treatment, we will call you to review the results.   Testing/Procedures: No new testing needed   Follow-Up: At [facility], you and your health needs are our priority.  As part of our continuing mission to provide you with exceptional heart care, we have created designated Provider Care Teams.  These Care Teams include your primary Cardiologist (physician) and Advanced Practice Providers (APPs -  Physician Assistants and Nurse Practitioners) who all work together to provide you with the care you need, when you need it.  . You will need a follow up appointment in 12 months .   Please call our office 2 months in advance to schedule this appointment.    . Providers on your designated Care Team:   . Murray Hodgkins, NP . Christell Faith, PA-C . Marrianne Mood, PA-C  Any Other Special Instructions Will Be Listed Below (If Applicable).  For educational health videos Log in to : [email or web address] Or : [email or web address], password : [email or web address]

## 2018-11-18 NOTE — Progress Notes (Signed)
Cardiology Office Note  Date:  11/18/2018   ID:  Teresa Franco, DOB 10/25/1960, MRN [id number]  PCP:  Burnard Hawthorne, FNP   Chief Complaint  Patient presents with  . Other    Past due follow up. patient denies chest pain and SOB at this time. Meds reviewede verbally with patient.     HPI:  Teresa Franco is a very pleasant 58 year old woman with past medical history of Hyperlipidemia Prediabetes, hemoglobin A1c 6.0 Smoker, stopped 19 years, smoked for 4 yrs Father died early 38s from cardiac related complications, cause unclear Who presents for f/u of her family history of heart disease and hyperlipidemia  CT coronary calcium score images pulled up and discussed Calcium score of 26 Calcium in the LAD, RCA Also with severe fatty liver  No recent lab work reviewed Previous Total cholesterol 273 LDL 191 LFTs have been trending up over the past 2 years She reports weight has been trending up Up over 10 pounds in the past year  Denies any significant shortness of breath or chest pain  Family history Mom is 23, no heart issues Dad died early 63s  Previous Echo 5 yrs ago, was "ok", done through [facility] Records have been requested  EKG personally reviewed by myself on todays visit Shows sinus tachycardia rate 103 bpm no significant ST or T wave changes   PMH:   has a past medical history of Breast cancer ([place]) (2004), Dysrhythmia, Hyperlipidemia, LGSIL on Pap smear of cervix, Personal history of chemotherapy, Personal history of radiation therapy, Prediabetes, Seasonal allergies, and UTI (urinary tract infection).  PSH:    Past Surgical History:  Procedure Laterality Date  . BREAST BIOPSY Right 07/2002   positive  . BREAST BIOPSY Right 2010   negative  . BREAST LUMPECTOMY Right 08/09/2002   positive for breast cancer  . BROW LIFT Bilateral 06/25/2017   Procedure: BLEPHAROPLASTY UPPER EYELID WITH EXCESS SKIN;  Surgeon: Karle Starch, MD;  Location: [facility];  Service: Ophthalmology;  Laterality: Bilateral;  . COLONOSCOPY  03/31/2010  . LEEP  04/10/1993  . OTHER SURGICAL HISTORY  06/2017   eyelid, took off excessive skin    Current Outpatient Medications  Medication Sig Dispense Refill  . Ascorbic Acid (VITAMIN C) 1000 MG tablet Take 1,000 mg by mouth daily.    Marland Kitchen BIOTIN PO Take by mouth daily.     . Cholecalciferol (VITAMIN D3 PO) Take by mouth daily.     Marland Kitchen MEGARED OMEGA-3 KRILL OIL PO Take by mouth daily.     . Milk Thistle 175 MG CAPS Take by mouth daily.    . rosuvastatin (CRESTOR) 10 MG tablet TAKE 1 TABLET BY MOUTH EVERY DAY 30 tablet 0  . VIT B12-METHIONINE-INOS-CHOL IM daily.      No current facility-administered medications for this visit.      Allergies:   Sulfa antibiotics   Social History:  The patient  reports that she has never smoked. She has never used smokeless tobacco. She reports current alcohol use. She reports that she does not use drugs.   Family History:   family history includes Breast cancer (age of onset: 21) in her maternal aunt; Breast cancer (age of onset: 34) in an other family member; Heart disease in her mother; Heart disease (age of onset: 80) in her father; Hyperlipidemia in her father and mother; Hypertension in her father; Thyroid disease in her mother.    Review of Systems: Review of Systems  Constitutional: Negative.  Respiratory: Negative.   Cardiovascular: Negative.   Gastrointestinal: Negative.   Musculoskeletal: Negative.   Neurological: Negative.   Psychiatric/Behavioral: Negative.   All other systems reviewed and are negative.   PHYSICAL EXAM: VS:  BP 128/80 (BP Location: Left Arm, Patient Position: Sitting, Cuff Size: Normal)   Pulse (!) 103   Ht 5' 8"  (1.727 m)   Wt 199 lb (90.3 kg)   BMI 30.26 kg/m  , BMI Body mass index is 30.26 kg/m. Constitutional:  oriented to person, place, and time. No distress.  HENT:  Head: Grossly normal Eyes:  no discharge. No scleral  icterus.  Neck: No JVD, no carotid bruits  Cardiovascular: Regular rate and rhythm, no murmurs appreciated Pulmonary/Chest: Clear to auscultation bilaterally, no wheezes or rails Abdominal: Soft.  no distension.  no tenderness.  Musculoskeletal: Normal range of motion Neurological:  normal muscle tone. Coordination normal. No atrophy Skin: Skin warm and dry Psychiatric: normal affect, pleasant   Recent Labs: 10/30/2018: ALT 55; BUN 14; Creatinine, Ser 0.91; Hemoglobin 14.5; Platelets 271; Potassium 3.9; Sodium 136    Lipid Panel Lab Results  Component Value Date   CHOL 273 (H) 02/23/2016   HDL 51.30 02/23/2016   LDLCALC 191 (H) 02/23/2016   TRIG 153.0 (H) 02/23/2016      Wt Readings from Last 3 Encounters:  11/18/18 199 lb (90.3 kg)  10/09/18 199 lb 15.3 oz (90.7 kg)  07/10/18 205 lb (93 kg)       ASSESSMENT AND PLAN:  Family history of cardiovascular disorder -  CT coronary calcium score addressed with her, images pulled up and reviewed.  Score of 26, also with severe fatty liver -Recommended exercise program, dietary changes for weight loss -We have ordered lipid panel If cholesterol above goal could add Zetia  Sinus tachycardia Noted on prior office visit Recommend she monitor heart rate at home  Mixed hyperlipidemia As above we will check lipid panel on Crestor LDL at goal less than 100, preferably less than 70 May need to add Zetia Strong recommendation for weight loss given NASH  NASH Seen on CT coronary calcium scoring last year Elevated LFTs of the past 2 years coinciding with weight gain Recommended dietary change, dietary guide provided  Disposition:   F/U 1 year    Total encounter time more than 25 minutes  Greater than 50% was spent in counseling and coordination of care with the patient    Orders Placed This Encounter  Procedures  . Hepatic function panel  . Lipid Profile  . EKG 12-Lead     Signed, Esmond Plants, M.D.,  Ph.D. 11/18/2018  [place], [place]

## 2019-04-18 ENCOUNTER — Other Ambulatory Visit: Payer: Self-pay | Admitting: Cardiovascular Disease

## 2019-04-18 LAB — HEPATIC FUNCTION PANEL
ALT: 35 IU/L — ABNORMAL HIGH (ref 0–32)
AST: 29 IU/L (ref 0–40)
Albumin: 5 g/dL — ABNORMAL HIGH (ref 3.8–4.9)
Alkaline Phosphatase: 80 IU/L (ref 39–117)
Bilirubin Total: 0.4 mg/dL (ref 0.0–1.2)
Bilirubin, Direct: 0.14 mg/dL (ref 0.00–0.40)
Total Protein: 8 g/dL (ref 6.0–8.5)

## 2019-04-18 LAB — LIPID PANEL
Chol/HDL Ratio: 3.2 ratio (ref 0.0–4.4)
Cholesterol, Total: 174 mg/dL (ref 100–199)
HDL: 55 mg/dL (ref >39)
LDL Chol Calc (NIH): 101 mg/dL — ABNORMAL HIGH (ref 0–99)
Triglycerides: 102 mg/dL (ref 0–149)
VLDL Cholesterol Cal: 18 mg/dL (ref 5–40)

## 2019-04-20 ENCOUNTER — Other Ambulatory Visit: Payer: Self-pay | Admitting: *Deleted

## 2019-04-20 MED ORDER — EZETIMIBE 10 MG PO TABS: 10.0000 mg | ORAL_TABLET | Freq: Every day | ORAL | 3 refills | Status: DC

## 2019-04-20 MED ORDER — ROSUVASTATIN CALCIUM 10 MG PO TABS: 10.0000 mg | ORAL_TABLET | Freq: Every day | ORAL | 3 refills | Status: DC

## 2019-06-15 IMAGING — MG MM DIGITAL SCREENING BILAT W/ TOMO W/ CAD
6 of 10 series · 6 of 30 positions shown · non-contrast
Comparison: Previous exam(s).

CLINICAL DATA: Screening.

EXAM:
DIGITAL SCREENING BILATERAL MAMMOGRAM WITH TOMO AND CAD

[R MLO synth-2D (1 of 2)]
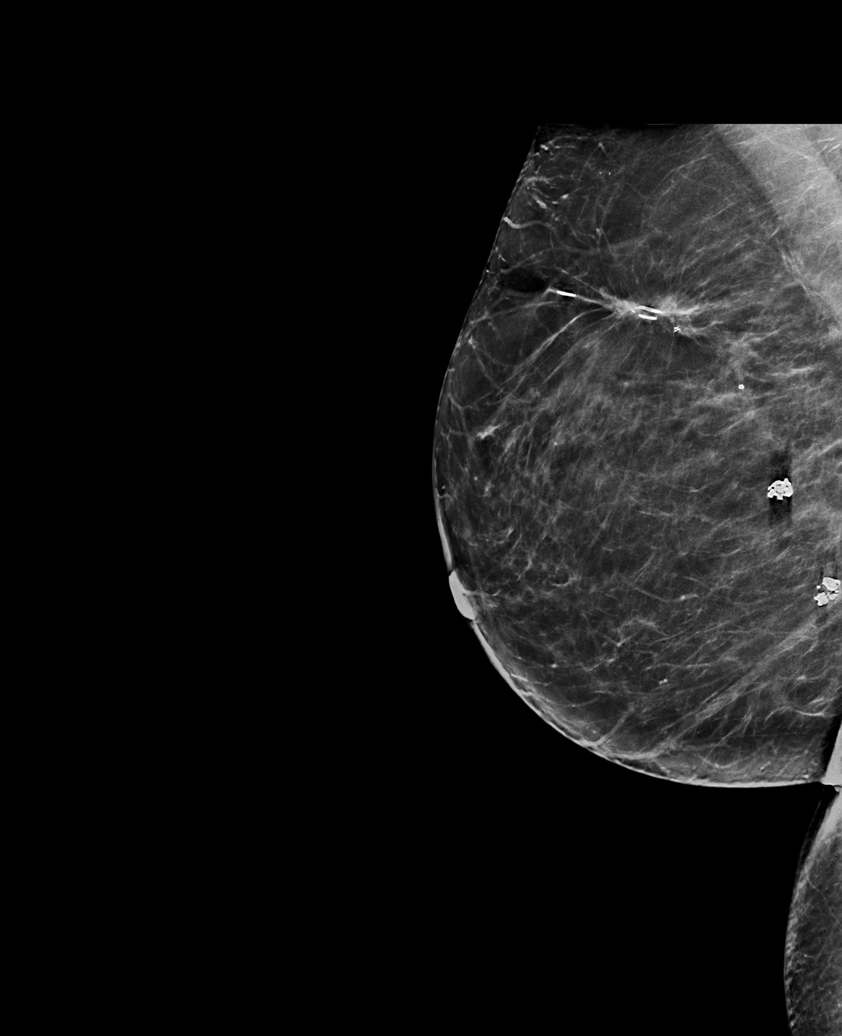

[R MLO synth-2D (2 of 2)]
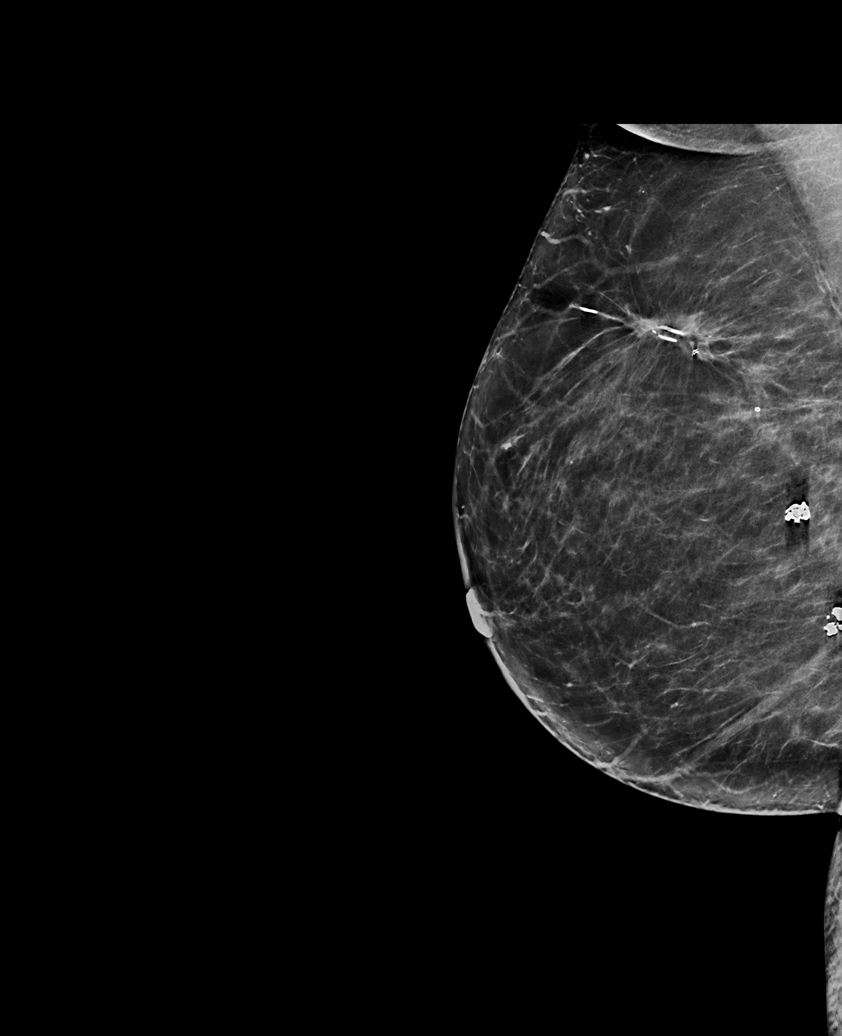

[L CC synth-2D]
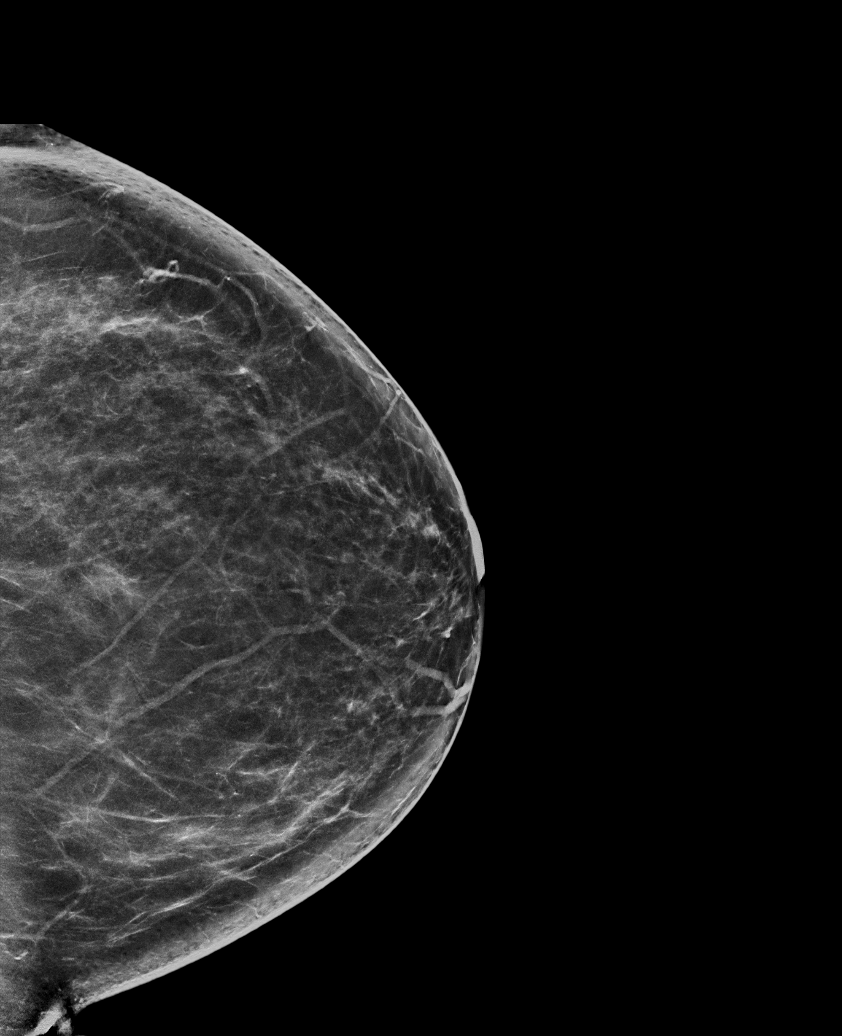

[L MLO synth-2D]
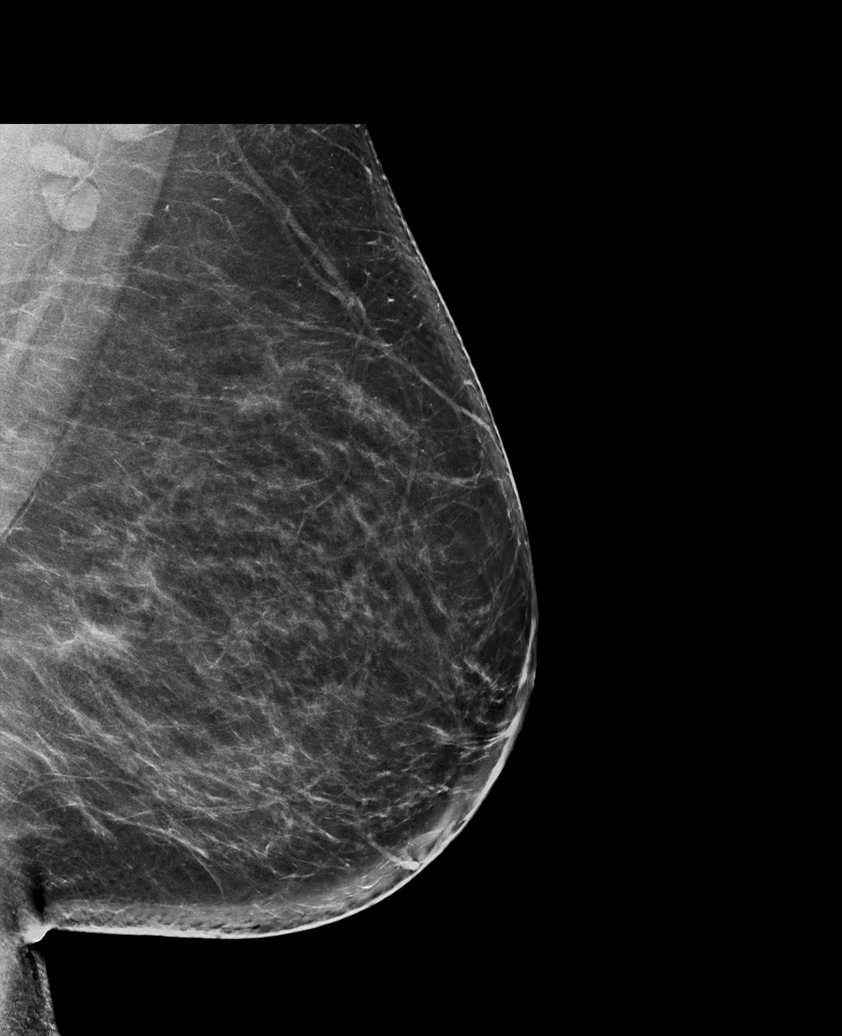

[R CC synth-2D]
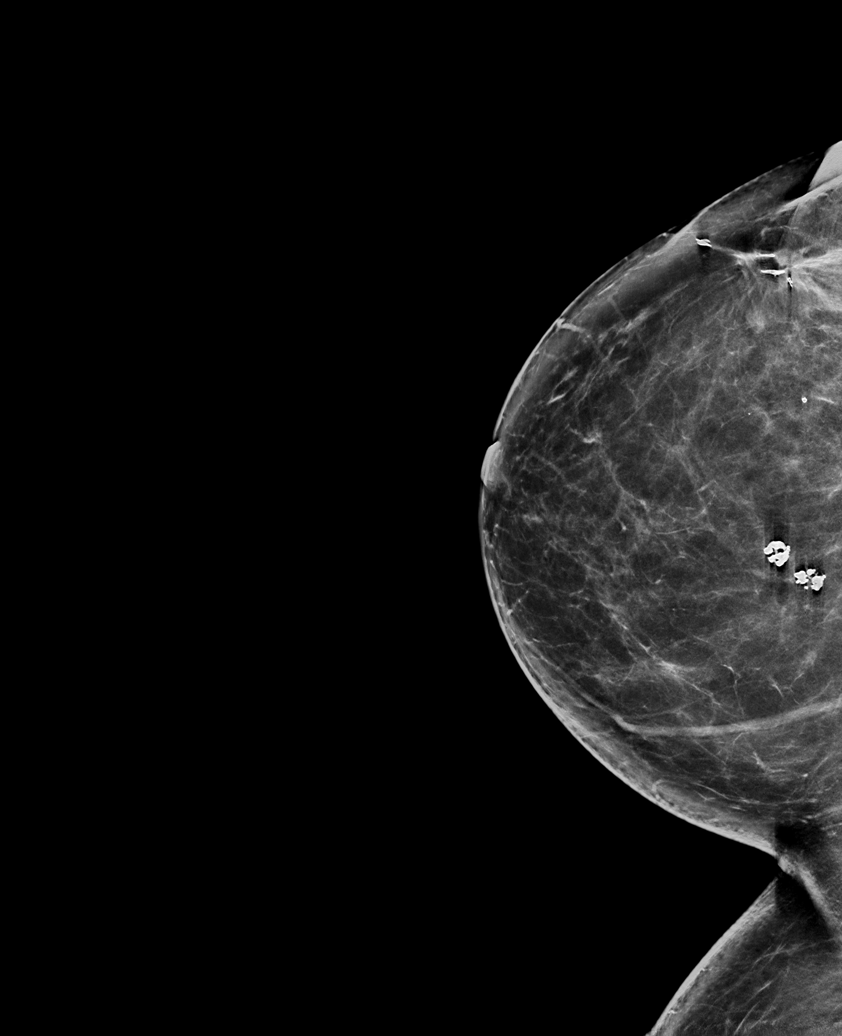

[R MLO tomo · tomo slice 39/78.0]
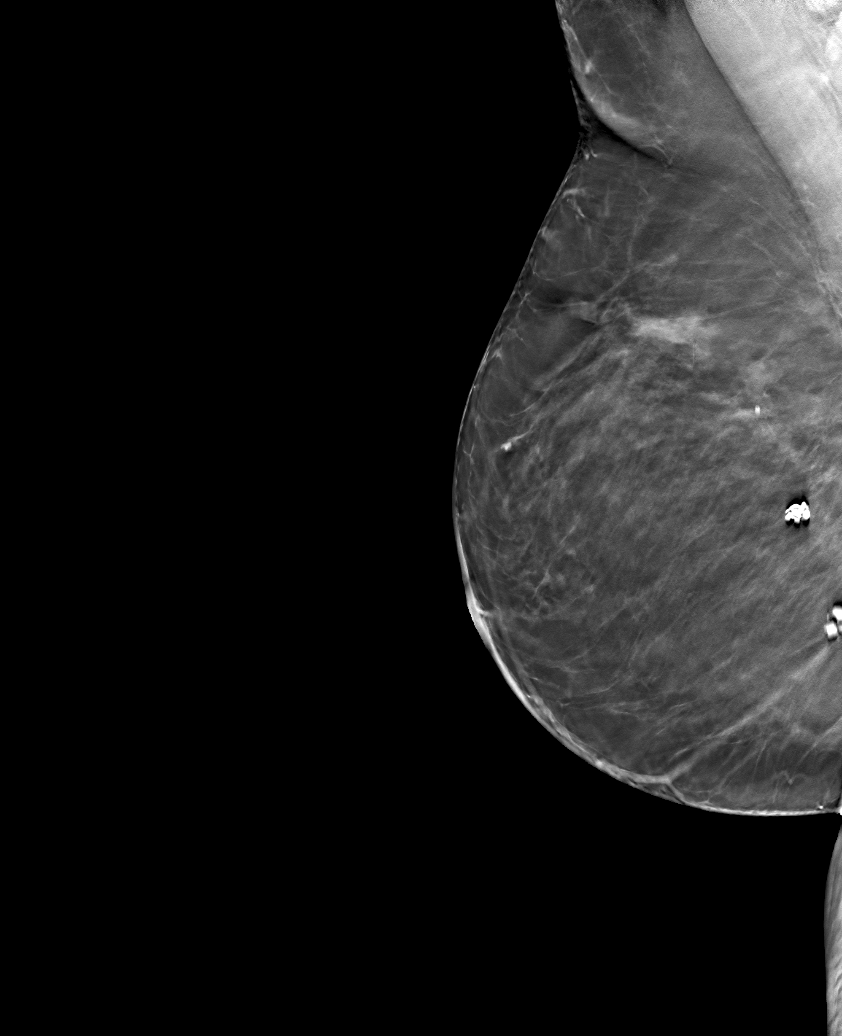

[6 of 30 positions shown; findings below may reference images not displayed]

ACR Breast Density Category b: There are scattered areas of
fibroglandular density.
FINDINGS: There are no findings suspicious for malignancy. Images were
processed with CAD.
IMPRESSION: No mammographic evidence of malignancy. A result letter of this
screening mammogram will be mailed directly to the patient.

RECOMMENDATION:
Screening mammogram in one year. (Code:CN-U-775)

BI-RADS CATEGORY  1: Negative.

## 2019-07-30 ENCOUNTER — Other Ambulatory Visit: Payer: Self-pay | Admitting: Hematology and Oncology

## 2019-07-30 DIAGNOSIS — Z1231 Encounter for screening mammogram for malignant neoplasm of breast: Secondary | ICD-10-CM

## 2019-10-12 ENCOUNTER — Encounter: Payer: Self-pay | Admitting: Obstetrics and Gynecology

## 2019-10-12 ENCOUNTER — Other Ambulatory Visit: Payer: PRIVATE HEALTH INSURANCE

## 2019-10-12 ENCOUNTER — Ambulatory Visit: Payer: PRIVATE HEALTH INSURANCE | Admitting: Hematology and Oncology

## 2019-10-12 NOTE — Progress Notes (Signed)
PCP: Burnard Hawthorne, FNP   Chief Complaint  Patient presents with  . Gynecologic Exam    HPI:      [name] is a 59 y.o. G2P1011 who LMP was No LMP recorded. Patient is postmenopausal., presents today for her annual examination.  Her menses are absent and she is postmenopausal. She does not have intermenstrual bleeding. She does have occas vasomotor sx.   Sex activity: single partner, contraception - post menopausal status. She does not have vaginal dryness.  Last Pap:  03/24/18  ASCUS with neg HPV DNA, same result 2018. Repeat due this yr.  November 09, 2015  Results were: low-grade squamous intraepithelial neoplasia (LGSIL - encompassing HPV,mild dysplasia,CIN I) /neg HPV DNA. Colpo 12/05/15 with Dr. Georgianne Fick. 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  Per Dr. Georgianne Fick, pts with recurrent LGSIL and neg colpo/bx may be better suited for LEEP.  Hx of STDs: HPV  Last mammogram: 10/06/18  Results were: normal--routine follow-up in 12 months. Had appt today. Pt is s/p RT breast cancer, her2/neu+ in 2004 (age 55). Had lumpectomy, chemo, rad, 5 yrs of tamoxifen, and 5 yrs of letrozole. Not on any tx currently. Followed by onc yearly.   There is a FH of breast cancer in 2 mat grt aunts. Pt did BRCA genetic through [facility] a few yrs ago. Panel testing done. There is no FH of ovarian cancer. The patient does do self-breast exams. She takes Vit D supp.  Colonoscopy: colonoscopy 2012 without abnormalities.  Repeat due after 5-10 years. Pt getting referred for scr colonoscopy by PCP this yr since unsure when repeat due.  DEXA: done in past; pt to ask oncology when due again since on tamoxifen and letrozole in past.  Tobacco use: The patient denies current or previous tobacco use. Alcohol use: none  No drug use Exercise: mod active  She does get adequate calcium and Vitamin D in her diet.  Labs with PCP.  Past Medical History:  Diagnosis Date  . Breast cancer ([place]) 2004   chemo/ radiation;  Her2/neu +  . Dysrhythmia    palpitations  . Hyperlipidemia   . LGSIL on Pap smear of cervix   . Personal history of chemotherapy   . Personal history of radiation therapy   . Prediabetes   . Seasonal allergies   . UTI (urinary tract infection)     Past Surgical History:  Procedure Laterality Date  . BREAST BIOPSY Right 07/2002   positive  . BREAST BIOPSY Right 2010   negative  . BREAST LUMPECTOMY Right 08/09/2002   positive for breast cancer  . BROW LIFT Bilateral 06/25/2017   Procedure: BLEPHAROPLASTY UPPER EYELID WITH EXCESS SKIN;  Surgeon: Karle Starch, MD;  Location: [facility];  Service: Ophthalmology;  Laterality: Bilateral;  . COLONOSCOPY  03/31/2010  . LEEP  04/10/1993  . OTHER SURGICAL HISTORY  06/2017   eyelid, took off excessive skin    Family History  Problem Relation Age of Onset  . Hyperlipidemia Mother   . Heart disease Mother   . Thyroid disease Mother   . Hyperlipidemia Father   . Heart disease Father 54       MI- died at 13 ? SCD  . Hypertension Father   . Breast cancer Maternal Aunt 66  . Breast cancer Maternal Aunt 60  . Colon cancer Neg Hx   . Ovarian cancer Neg Hx   . Melanoma Neg Hx     Social History   Socioeconomic  History  . Marital status: Married    Spouse name: Not on file  . Number of children: Not on file  . Years of education: Not on file  . Highest education level: Not on file  Occupational History  . Not on file  Tobacco Use  . Smoking status: Former Smoker    Quit date: 01/08/1997    Years since quitting: 22.7  . Smokeless tobacco: Never Used  Vaping Use  . Vaping Use: Never used  Substance and Sexual Activity  . Alcohol use: Yes    Alcohol/week: 0.0 standard drinks  . Drug use: No  . Sexual activity: Yes    Birth control/protection: Post-menopausal  Other Topics Concern  . Not on file  Social History Narrative   From [place]   Works for telecommunication    1 daughter            Social  Determinants of Health   Financial Resource Strain:   . Difficulty of Paying Living Expenses: Not on file  Food Insecurity:   . Worried About Charity fundraiser in the Last Year: Not on file  . Ran Out of Food in the Last Year: Not on file  Transportation Needs:   . Lack of Transportation (Medical): Not on file  . Lack of Transportation (Non-Medical): Not on file  Physical Activity:   . Days of Exercise per Week: Not on file  . Minutes of Exercise per Session: Not on file  Stress:   . Feeling of Stress : Not on file  Social Connections:   . Frequency of Communication with Friends and Family: Not on file  . Frequency of Social Gatherings with Friends and Family: Not on file  . Attends Religious Services: Not on file  . Active Member of Clubs or Organizations: Not on file  . Attends Archivist Meetings: Not on file  . Marital Status: Not on file  Intimate Partner Violence:   . Fear of Current or Ex-Partner: Not on file  . Emotionally Abused: Not on file  . Physically Abused: Not on file  . Sexually Abused: Not on file    Current Meds  Medication Sig  . Ascorbic Acid (VITAMIN C) 1000 MG tablet Take 1,000 mg by mouth daily.  Marland Kitchen BIOTIN PO Take by mouth daily.   . Cholecalciferol (VITAMIN D3 PO) Take by mouth daily.   Marland Kitchen MEGARED OMEGA-3 KRILL OIL PO Take by mouth daily.   . Milk Thistle 175 MG CAPS Take by mouth daily.  . rosuvastatin (CRESTOR) 10 MG tablet Take 1 tablet (10 mg total) by mouth daily.  Marland Kitchen VIT B12-METHIONINE-INOS-CHOL IM daily.       ROS:  Review of Systems  Constitutional: Negative for fatigue, fever and unexpected weight change.  Respiratory: Negative for cough, shortness of breath and wheezing.   Cardiovascular: Negative for chest pain, palpitations and leg swelling.  Gastrointestinal: Negative for blood in stool, constipation, diarrhea, nausea and vomiting.  Endocrine: Negative for cold intolerance, heat intolerance and polyuria.  Genitourinary:  Negative for dyspareunia, dysuria, flank pain, frequency, genital sores, hematuria, menstrual problem, pelvic pain, urgency, vaginal bleeding, vaginal discharge and vaginal pain.  Musculoskeletal: Negative for back pain, joint swelling and myalgias.  Skin: Negative for rash.  Neurological: Negative for dizziness, syncope, light-headedness, numbness and headaches.  Hematological: Negative for adenopathy.  Psychiatric/Behavioral: Negative for agitation, confusion, sleep disturbance and suicidal ideas. The patient is not nervous/anxious.      Objective: BP 126/80   Ht  _0  (1.727 m)   Wt 206 lb (93.4 kg)   BMI 31.32 kg/m    Physical Exam Constitutional:      Appearance: She is well-developed.  Genitourinary:     Vulva, vagina, uterus, right adnexa and left adnexa normal.     No vulval lesion or tenderness noted.     No vaginal discharge, erythema or tenderness.     No cervical motion tenderness or polyp.     Uterus is not enlarged or tender.     No right or left adnexal mass present.     Right adnexa not tender.     Left adnexa not tender.  Neck:     Thyroid: No thyromegaly.  Cardiovascular:     Rate and Rhythm: Normal rate and regular rhythm.     Heart sounds: Normal heart sounds. No murmur heard.   Pulmonary:     Effort: Pulmonary effort is normal.     Breath sounds: Normal breath sounds.  Chest:     Breasts:        Right: No mass, nipple discharge, skin change or tenderness.        Left: No mass, nipple discharge, skin change or tenderness.  Abdominal:     Palpations: Abdomen is soft.     Tenderness: There is no abdominal tenderness. There is no guarding.  Musculoskeletal:        General: Normal range of motion.     Cervical back: Normal range of motion.  Neurological:     General: No focal deficit present.     Mental Status: She is alert and oriented to person, place, and time.     Cranial Nerves: No cranial nerve deficit.  Skin:    General: Skin is warm and  dry.  Psychiatric:        Mood and Affect: Mood normal.        Behavior: Behavior normal.        Thought Content: Thought content normal.        Judgment: Judgment normal.  Vitals reviewed.     Assessment/Plan:  Encounter for annual routine gynecological examination  Cervical cancer screening - Plan: Cytology - PAP  Screening for HPV (human papillomavirus) - Plan: Cytology - PAP  Cervical dysplasia - Plan: Cytology - PAP; repeat due today. Will f/u if abn.  Encounter for screening mammogram for malignant neoplasm of breast--pt had mammo today, followed by onc  History of right breast cancer--Pt is BRCA neg. Offered update testing. Pt will discuss with onc at her appt on Thurs.  Family history of breast cancer         GYN counsel mammography screening, adequate intake of calcium and vitamin D, diet and exercise    F/U  Return in about 1 year (around 10/12/2020).  Elisavet Buehrer B. Donyelle Enyeart, PA-C 10/13/2019 1:53 PM

## 2019-10-13 ENCOUNTER — Other Ambulatory Visit: Payer: Self-pay

## 2019-10-13 ENCOUNTER — Telehealth: Payer: Self-pay

## 2019-10-13 ENCOUNTER — Encounter: Payer: Self-pay | Admitting: Obstetrics and Gynecology

## 2019-10-13 ENCOUNTER — Ambulatory Visit: Payer: 59 | Admitting: Family

## 2019-10-13 ENCOUNTER — Ambulatory Visit (INDEPENDENT_AMBULATORY_CARE_PROVIDER_SITE_OTHER): Payer: 59 | Admitting: Obstetrics and Gynecology

## 2019-10-13 ENCOUNTER — Ambulatory Visit (INDEPENDENT_AMBULATORY_CARE_PROVIDER_SITE_OTHER): Payer: 59 | Admitting: Family

## 2019-10-13 ENCOUNTER — Ambulatory Visit
Admission: RE | Admit: 2019-10-13 | Discharge: 2019-10-13 | Disposition: A | Discharge status: Home / Self Care | Payer: 59 | Source: Ambulatory Visit | Attending: Hematology and Oncology | Admitting: Hematology and Oncology

## 2019-10-13 ENCOUNTER — Other Ambulatory Visit (HOSPITAL_COMMUNITY)
Admission: RE | Admit: 2019-10-13 | Discharge: 2019-10-13 | Disposition: A | Discharge status: Home / Self Care | Payer: 59 | Source: Ambulatory Visit | Attending: Obstetrics and Gynecology | Admitting: Obstetrics and Gynecology

## 2019-10-13 ENCOUNTER — Encounter: Payer: Self-pay | Admitting: Family

## 2019-10-13 ENCOUNTER — Encounter: Payer: 59 | Admitting: Family

## 2019-10-13 VITALS — BP 126/80 | Ht 68.0 in | Wt 206.0 lb

## 2019-10-13 VITALS — BP 116/82 | HR 109 | Temp 98.3°F | Ht 67.99 in | Wt 199.8 lb

## 2019-10-13 DIAGNOSIS — N879 Dysplasia of cervix uteri, unspecified: Secondary | ICD-10-CM | POA: Diagnosis not present

## 2019-10-13 DIAGNOSIS — E669 Obesity, unspecified: Secondary | ICD-10-CM | POA: Diagnosis not present

## 2019-10-13 DIAGNOSIS — Z124 Encounter for screening for malignant neoplasm of cervix: Secondary | ICD-10-CM

## 2019-10-13 DIAGNOSIS — Z1231 Encounter for screening mammogram for malignant neoplasm of breast: Secondary | ICD-10-CM | POA: Diagnosis not present

## 2019-10-13 DIAGNOSIS — Z1151 Encounter for screening for human papillomavirus (HPV): Secondary | ICD-10-CM

## 2019-10-13 DIAGNOSIS — E663 Overweight: Secondary | ICD-10-CM | POA: Insufficient documentation

## 2019-10-13 DIAGNOSIS — K7581 Nonalcoholic steatohepatitis (NASH): Secondary | ICD-10-CM

## 2019-10-13 DIAGNOSIS — Z853 Personal history of malignant neoplasm of breast: Secondary | ICD-10-CM

## 2019-10-13 DIAGNOSIS — Z0000 Encounter for general adult medical examination without abnormal findings: Secondary | ICD-10-CM | POA: Diagnosis not present

## 2019-10-13 DIAGNOSIS — Z803 Family history of malignant neoplasm of breast: Secondary | ICD-10-CM | POA: Insufficient documentation

## 2019-10-13 DIAGNOSIS — Z01419 Encounter for gynecological examination (general) (routine) without abnormal findings: Secondary | ICD-10-CM | POA: Diagnosis not present

## 2019-10-13 LAB — COMPREHENSIVE METABOLIC PANEL
ALT: 20 U/L (ref 0–35)
AST: 16 U/L (ref 0–37)
Albumin: 4.3 g/dL (ref 3.5–5.2)
Alkaline Phosphatase: 71 U/L (ref 39–117)
BUN: 9 mg/dL (ref 6–23)
CO2: 32 mEq/L (ref 19–32)
Calcium: 9.8 mg/dL (ref 8.4–10.5)
Chloride: 102 mEq/L (ref 96–112)
Creatinine, Ser: 0.86 mg/dL (ref 0.40–1.20)
GFR: 74.45 mL/min (ref >60.00)
Glucose, Bld: 109 mg/dL — ABNORMAL HIGH (ref 70–99)
Potassium: 4.6 mEq/L (ref 3.5–5.1)
Sodium: 141 mEq/L (ref 135–145)
Total Bilirubin: 0.5 mg/dL (ref 0.2–1.2)
Total Protein: 7.3 g/dL (ref 6.0–8.3)

## 2019-10-13 LAB — CBC WITH DIFFERENTIAL/PLATELET
Basophils Absolute: 0.1 10*3/uL (ref 0.0–0.1)
Basophils Relative: 0.9 % (ref 0.0–3.0)
Eosinophils Absolute: 0.2 10*3/uL (ref 0.0–0.7)
Eosinophils Relative: 2.1 % (ref 0.0–5.0)
HCT: 41.1 % (ref 36.0–46.0)
Hemoglobin: 13.6 g/dL (ref 12.0–15.0)
Lymphocytes Relative: 20.3 % (ref 12.0–46.0)
Lymphs Abs: 1.5 10*3/uL (ref 0.7–4.0)
MCHC: 33.1 g/dL (ref 30.0–36.0)
MCV: 89.3 fl (ref 78.0–100.0)
Monocytes Absolute: 0.4 10*3/uL (ref 0.1–1.0)
Monocytes Relative: 5 % (ref 3.0–12.0)
Neutro Abs: 5.5 10*3/uL (ref 1.4–7.7)
Neutrophils Relative %: 71.7 % (ref 43.0–77.0)
Platelets: 241 10*3/uL (ref 150.0–400.0)
RBC: 4.6 Mil/uL (ref 3.87–5.11)
RDW: 14.4 % (ref 11.5–15.5)
WBC: 7.6 10*3/uL (ref 4.0–10.5)

## 2019-10-13 LAB — URINALYSIS, ROUTINE W REFLEX MICROSCOPIC
Bilirubin Urine: NEGATIVE
Hgb urine dipstick: NEGATIVE
Ketones, ur: NEGATIVE
Nitrite: NEGATIVE
RBC / HPF: NONE SEEN (ref 0–?)
Specific Gravity, Urine: 1.02 (ref 1.000–1.030)
Total Protein, Urine: NEGATIVE
Urine Glucose: NEGATIVE
Urobilinogen, UA: 0.2 (ref 0.0–1.0)
pH: 6 (ref 5.0–8.0)

## 2019-10-13 LAB — MICROALBUMIN / CREATININE URINE RATIO
Creatinine,U: 136.4 mg/dL
Microalb Creat Ratio: 0.5 mg/g (ref 0.0–30.0)
Microalb, Ur: <0.7 mg/dL (ref 0.0–1.9)

## 2019-10-13 LAB — HEMOGLOBIN A1C: Hgb A1c MFr Bld: 6.6 % — ABNORMAL HIGH (ref 4.6–6.5)

## 2019-10-13 NOTE — Telephone Encounter (Signed)
[employer] Medical Examination form was placed in the Patient Paperwork folder on [employer].

## 2019-10-13 NOTE — Assessment & Plan Note (Signed)
No image in chart to support this. H/o elevated LFTs. Suspect CT cardiac perhaps identified as ordered by [name] which was discussed with patient. Pending US,LFTs

## 2019-10-13 NOTE — Progress Notes (Signed)
Subjective:    Patient ID: Teresa Franco, female    DOB: Sep 13, 1960, 59 y.o.   MRN: [id number]  CC: [name] is a 59 y.o. female who presents today for physical exam.    HPI: Feels well today She was told she had fatty liver disease earlier this year by [name]. H/o elevated liver enzymes.  No abdominal pain, constipation  Follows with [name] with tachycardia, HLD. Occasional palpitations however feels over improved. No CP, sob.   Would also like [employer] life insurance form completed.         Colorectal Cancer Screening: UTD , done with [name]. Unsure if due.  Breast Cancer Screening: Mammogram UTD, done today, awaiting results H/o breast cancer right  Cervical Cancer Screening: follows with GYN, sees them today.  Bone Health screening/DEXA for 65+: No increased fracture risk. Defer screening at this time.  Lung Cancer Screening: Doesn't have 30 year pack year history and age > 72 years yo 68 years; quit 20 years ago        Tetanus - utd         Hepatitis C screening - would like screened for health insurance through [place]: Screening labs today with exception of lipid panel. Exercise: Gets regular exercise with walking 2-3 per week. Endorses indiscretion with chips, popcorn.    Alcohol use:  occassional Smoking/tobacco use: former smoker.     HISTORY:  Past Medical History:  Diagnosis Date  . Breast cancer ([place]) 2004   chemo/ radiation; Her2/neu +  . Dysrhythmia    palpitations  . Hyperlipidemia   . LGSIL on Pap smear of cervix   . Personal history of chemotherapy   . Personal history of radiation therapy   . Prediabetes   . Seasonal allergies   . UTI (urinary tract infection)     Past Surgical History:  Procedure Laterality Date  . BREAST BIOPSY Right 07/2002   positive  . BREAST BIOPSY Right 2010   negative  . BREAST LUMPECTOMY Right 08/09/2002   positive for breast cancer  . BROW LIFT Bilateral 06/25/2017   Procedure:  BLEPHAROPLASTY UPPER EYELID WITH EXCESS SKIN;  Surgeon: Karle Starch, MD;  Location: [facility];  Service: Ophthalmology;  Laterality: Bilateral;  . COLONOSCOPY  03/31/2010  . LEEP  04/10/1993  . OTHER SURGICAL HISTORY  06/2017   eyelid, took off excessive skin   Family History  Problem Relation Age of Onset  . Hyperlipidemia Mother   . Heart disease Mother   . Thyroid disease Mother   . Hyperlipidemia Father   . Heart disease Father 80       MI- died at 5 ? SCD  . Hypertension Father   . Breast cancer Other 60  . Breast cancer Maternal Aunt 43  . Colon cancer Neg Hx   . Ovarian cancer Neg Hx   . Melanoma Neg Hx       ALLERGIES: Sulfa antibiotics  Current Outpatient Medications on File Prior to Visit  Medication Sig Dispense Refill  . Ascorbic Acid (VITAMIN C) 1000 MG tablet Take 1,000 mg by mouth daily.    Marland Kitchen BIOTIN PO Take by mouth daily.     . Cholecalciferol (VITAMIN D3 PO) Take by mouth daily.     Marland Kitchen MEGARED OMEGA-3 KRILL OIL PO Take by mouth daily.     . Milk Thistle 175 MG CAPS Take by mouth daily.    . rosuvastatin (CRESTOR) 10 MG tablet Take 1  tablet (10 mg total) by mouth daily. 90 tablet 3  . VIT B12-METHIONINE-INOS-CHOL IM daily.     Marland Kitchen ezetimibe (ZETIA) 10 MG tablet Take 1 tablet (10 mg total) by mouth daily. 90 tablet 3   No current facility-administered medications on file prior to visit.    Social History   Tobacco Use  . Smoking status: Former Smoker    Quit date: 01/08/1997    Years since quitting: 22.7  . Smokeless tobacco: Never Used  Vaping Use  . Vaping Use: Never used  Substance Use Topics  . Alcohol use: Yes    Alcohol/week: 0.0 standard drinks  . Drug use: No    Review of Systems  Constitutional: Negative for chills, fever and unexpected weight change.  HENT: Negative for congestion.   Respiratory: Negative for cough.   Cardiovascular: Positive for palpitations. Negative for chest pain and leg swelling.  Gastrointestinal:  Negative for nausea and vomiting.  Genitourinary: Negative for pelvic pain.  Musculoskeletal: Negative for arthralgias and myalgias.  Skin: Negative for rash.  Neurological: Negative for headaches.  Hematological: Negative for adenopathy.  Psychiatric/Behavioral: Negative for confusion.      Objective:    BP 116/82 (BP Location: Left Arm, Patient Position: Sitting)   Pulse (!) 109   Temp 98.3 F (36.8 C)   Ht 5' 7.99" (1.727 m)   Wt 199 lb 12.8 oz (90.6 kg)   SpO2 97%   BMI 30.39 kg/m   BP Readings from Last 3 Encounters:  10/13/19 116/82  11/18/18 128/80  10/09/18 121/90   Wt Readings from Last 3 Encounters:  10/13/19 199 lb 12.8 oz (90.6 kg)  11/18/18 199 lb (90.3 kg)  10/09/18 199 lb 15.3 oz (90.7 kg)    Physical Exam Vitals reviewed.  Constitutional:      Appearance: She is well-developed.  Eyes:     Conjunctiva/sclera: Conjunctivae normal.  Neck:     Thyroid: No thyroid mass or thyromegaly.  Cardiovascular:     Rate and Rhythm: Normal rate and regular rhythm.     Pulses: Normal pulses.     Heart sounds: Normal heart sounds.  Pulmonary:     Effort: Pulmonary effort is normal.     Breath sounds: Normal breath sounds. No wheezing, rhonchi or rales.  Lymphadenopathy:     Head:     Right side of head: No submental, submandibular, tonsillar, preauricular, posterior auricular or occipital adenopathy.     Left side of head: No submental, submandibular, tonsillar, preauricular, posterior auricular or occipital adenopathy.     Cervical: No cervical adenopathy.  Skin:    General: Skin is warm and dry.  Neurological:     Mental Status: She is alert.  Psychiatric:        Speech: Speech normal.        Behavior: Behavior normal.        Thought Content: Thought content normal.        Assessment & Plan:   Problem List Items Addressed This Visit      Digestive   NASH (nonalcoholic steatohepatitis)    No image in chart to support this. H/o elevated LFTs.  Suspect CT cardiac perhaps identified as ordered by [name] which was discussed with patient. Pending US,LFTs      Relevant Orders   US ABDOMEN LIMITED RUQ     Other   Obesity (BMI 30-39.9)    Encouraged weight loss through low GI diet provided and discussed with patient today.  Routine physical examination - Primary    Patient declines CBE and pelvic exam as she is seeing GYN today. Colonoscopy ordered. Mammogram done today.declines shingrex today and will return for second vaccine to complete series. Per CDC regarding shingrex: If more than 6 months have elapsed since the first dose of Shingrix, you should administer the second dose as soon as possible. However, you do not need to restart the vaccine series.      Relevant Orders   Ambulatory referral to Gastroenterology   Comprehensive metabolic panel   Hemoglobin A1c   Hepatitis C antibody   Urinalysis, Routine w reflex microscopic   CBC with Differential/Platelet   Nicotine screen, urine   Microalbumin / creatinine urine ratio       I am having Jahayra T. Bellina maintain her BIOTIN PO, MEGARED OMEGA-3 KRILL OIL PO, Cholecalciferol (VITAMIN D3 PO), VIT B12-METHIONINE-INOS-CHOL IM, Milk Thistle, vitamin C, rosuvastatin, and ezetimibe.   No orders of the defined types were placed in this encounter.   Return precautions given.   Risks, benefits, and alternatives of the medications and treatment plan prescribed today were discussed, and patient expressed understanding.   Education regarding symptom management and diagnosis given to patient on AVS.   Continue to follow with Burnard Hawthorne, FNP for routine health maintenance.   Teresa Franco and I agreed with plan.   Mable Paris, FNP

## 2019-10-13 NOTE — Telephone Encounter (Signed)
Pt arrived today for a physical and brought in a [employer] Medical Examination form to be completed. Form has been placed on Margaret's desk.

## 2019-10-13 NOTE — Assessment & Plan Note (Addendum)
Patient declines CBE and pelvic exam as she is seeing GYN today. Colonoscopy ordered. Mammogram done today.declines shingrex today and will return for second vaccine to complete series. Per CDC regarding shingrex: If more than 6 months have elapsed since the first dose of Shingrix, you should administer the second dose as soon as possible. However, you do not need to restart the vaccine series.

## 2019-10-13 NOTE — Patient Instructions (Signed)
I value your feedback and entrusting us with your care. If you get a [facility] patient survey, I would appreciate you taking the time to let us know about your experience today. Thank you!  As of December 18, 2018, your lab results will be released to your MyChart immediately, before I even have a chance to see them. Please give me time to review them and contact you if there are any abnormalities. Thank you for your patience.  

## 2019-10-13 NOTE — Patient Instructions (Addendum)
Referral for ultrasound of liver and colonoscopy  Let us know if you dont hear back within a week in regards to an appointment being scheduled.    This is  Dr. Lupita Dawn  example of a  "Low GI"  Diet:  It will allow you to lose 4 to 8  lbs  per month if you follow it carefully.  Your goal with exercise is a minimum of 30 minutes of aerobic exercise 5 days per week (Walking does not count once it becomes easy!)    All of the foods can be found at grocery stores and in bulk at [employer].  The Atkins protein bars and shakes are available in more varieties at Target, [employer] and [place].     7 AM Breakfast:  Choose from the following:  Low carbohydrate Protein  Shakes (I recommend the  Premier Protein chocolate shakes,  EAS AdvantEdge "Carb Control" shakes  Or the Atkins shakes all are under 3 net carbs)     a scrambled egg/bacon/cheese burrito made with Mission's "carb balance" whole wheat tortilla  (about 10 net carbs )  Regulatory affairs officer (basically a quiche without the pastry crust) that is eaten cold and very convenient way to get your eggs.  8 carbs)  If you make your own protein shakes, avoid bananas and pineapple,  And use low carb greek yogurt or original /unsweetened almond or soy milk    Avoid cereal and bananas, oatmeal and cream of wheat and grits. They are loaded with carbohydrates!   10 AM: high protein snack:  Protein bar by Atkins (the snack size, under 200 cal, usually < 6 net carbs).    A stick of cheese:  Around 1 carb,  100 cal     Dannon Light n Fit [place] Yogurt  (80 cal, 8 carbs)  Other so called "protein bars" and Greek yogurts tend to be loaded with carbohydrates.  Remember, in food advertising, the word "energy" is synonymous for " carbohydrate."  Lunch:   A Sandwich using the bread choices listed, Can use any  Eggs,  lunchmeat, grilled meat or canned tuna), avocado, regular mayo/mustard  and cheese.  A Salad using blue cheese, ranch,  Goddess  or vinagrette,  Avoid taco shells, croutons or "confetti" and no "candied nuts" but regular nuts OK.   No pretzels, nabs  or chips.  Pickles and miniature sweet peppers are a good low carb alternative that provide a "crunch"  The bread is the only source of carbohydrate in a sandwich and  can be decreased by trying some of the attached alternatives to traditional loaf bread   Avoid "Low fat dressings, as well as [place] dressings They are loaded with sugar!   3 PM/ Mid day  Snack:  Consider  1 ounce of  almonds, walnuts, pistachios, pecans, peanuts,  Macadamia nuts or a nut medley.  Avoid "granola and granola bars "  Mixed nuts are ok in moderation as long as there are no raisins,  cranberries or dried fruit.   KIND bars are OK if you get the low glycemic index variety   Try the prosciutto/mozzarella cheese sticks by F[employer]ci  In deli /backery section   High protein      6 PM  Dinner:     Meat/fowl/fish with a green salad, and either broccoli, cauliflower, green beans, spinach, brussel sprouts or  Lima beans. DO NOT BREAD THE PROTEIN!!      There is  a low carb pasta by Dreamfield's that is acceptable and tastes great: only 5 digestible carbs/serving.( All grocery stores but BJs carry it ) Several ready made meals are available low carb:   Try Michel Angelo's chicken piccata or chicken or eggplant parm over low carb pasta.(Lowes and BJs)   Marjory Lies Sanchez's "Carnitas" (pulled pork, no sauce,  0 carbs) or his beef pot roast to make a dinner burrito (at [employer]J's)  Pesto over low carb pasta (bj's sells a good quality pesto in the center refrigerated section of the deli   Try satueeing  Cheral Marker with mushroooms as a good side   [employer] Giant makes a mashed cauliflower that tastes like mashed potatoes  Whole wheat pasta is still full of digestible carbs and  Not as low in glycemic index as Dreamfield's.   Brown rice is still rice,  So skip the rice and noodles if you eat [place] or  [place] (or at least limit to 1/2 cup)  9 PM snack :   Breyer's "low carb" fudgsicle or  ice cream bar (Carb Smart line), or  Weight Watcher's ice cream bar , or another "no sugar added" ice cream;  a serving of fresh berries/cherries with whipped cream   Cheese or DANNON'S LlGHT N FIT GREEK YOGURT  8 ounces of [place] unsweetened almond/cococunut milk    Treat yourself to a parfait made with whipped cream blueberiies, walnuts and vanilla greek yogurt  Avoid bananas, pineapple, grapes  and watermelon on a regular basis because they are high in sugar.  THINK OF THEM AS DESSERT  Remember that snack Substitutions should be less than 10 NET carbs per serving and meals < 20 carbs. Remember to subtract fiber grams to get the "net carbs."   Health Maintenance for Postmenopausal Women Menopause is a normal process in which your ability to get pregnant comes to an end. This process happens slowly over many months or years, usually between the ages of 19 and 77. Menopause is complete when you have missed your menstrual periods for 12 months. It is important to talk with your health care provider about some of the most common conditions that affect women after menopause (postmenopausal women). These include heart disease, cancer, and bone loss (osteoporosis). Adopting a healthy lifestyle and getting preventive care can help to promote your health and wellness. The actions you take can also lower your chances of developing some of these common conditions. What should I know about menopause? During menopause, you may get a number of symptoms, such as:  Hot flashes. These can be moderate or severe.  Night sweats.  Decrease in sex drive.  Mood swings.  Headaches.  Tiredness.  Irritability.  Memory problems.  Insomnia. Choosing to treat or not to treat these symptoms is a decision that you make with your health care provider. Do I need hormone replacement therapy?  Hormone replacement  therapy is effective in treating symptoms that are caused by menopause, such as hot flashes and night sweats.  Hormone replacement carries certain risks, especially as you become older. If you are thinking about using estrogen or estrogen with progestin, discuss the benefits and risks with your health care provider. What is my risk for heart disease and stroke? The risk of heart disease, heart attack, and stroke increases as you age. One of the causes may be a change in the body's hormones during menopause. This can affect how your body uses dietary fats, triglycerides, and cholesterol. Heart attack and stroke are  medical emergencies. There are many things that you can do to help prevent heart disease and stroke. Watch your blood pressure  High blood pressure causes heart disease and increases the risk of stroke. This is more likely to develop in people who have high blood pressure readings, are of African descent, or are overweight.  Have your blood pressure checked: ? Every 3-5 years if you are 65-26 years of age. ? Every year if you are 58 years old or older. Eat a healthy diet   Eat a diet that includes plenty of vegetables, fruits, low-fat dairy products, and lean protein.  Do not eat a lot of foods that are high in solid fats, added sugars, or sodium. Get regular exercise Get regular exercise. This is one of the most important things you can do for your health. Most adults should:  Try to exercise for at least 150 minutes each week. The exercise should increase your heart rate and make you sweat (moderate-intensity exercise).  Try to do strengthening exercises at least twice each week. Do these in addition to the moderate-intensity exercise.  Spend less time sitting. Even light physical activity can be beneficial. Other tips  Work with your health care provider to achieve or maintain a healthy weight.  Do not use any products that contain nicotine or tobacco, such as cigarettes,  e-cigarettes, and chewing tobacco. If you need help quitting, ask your health care provider.  Know your numbers. Ask your health care provider to check your cholesterol and your blood sugar (glucose). Continue to have your blood tested as directed by your health care provider. Do I need screening for cancer? Depending on your health history and family history, you may need to have cancer screening at different stages of your life. This may include screening for:  Breast cancer.  Cervical cancer.  Lung cancer.  Colorectal cancer. What is my risk for osteoporosis? After menopause, you may be at increased risk for osteoporosis. Osteoporosis is a condition in which bone destruction happens more quickly than new bone creation. To help prevent osteoporosis or the bone fractures that can happen because of osteoporosis, you may take the following actions:  If you are 6-27 years old, get at least 1,000 mg of calcium and at least 600 mg of vitamin D per day.  If you are older than age 44 but younger than age 53, get at least 1,200 mg of calcium and at least 600 mg of vitamin D per day.  If you are older than age 64, get at least 1,200 mg of calcium and at least 800 mg of vitamin D per day. Smoking and drinking excessive alcohol increase the risk of osteoporosis. Eat foods that are rich in calcium and vitamin D, and do weight-bearing exercises several times each week as directed by your health care provider. How does menopause affect my mental health? Depression may occur at any age, but it is more common as you become older. Common symptoms of depression include:  Low or sad mood.  Changes in sleep patterns.  Changes in appetite or eating patterns.  Feeling an overall lack of motivation or enjoyment of activities that you previously enjoyed.  Frequent crying spells. Talk with your health care provider if you think that you are experiencing depression. General instructions See your health  care provider for regular wellness exams and vaccines. This may include:  Scheduling regular health, dental, and eye exams.  Getting and maintaining your vaccines. These include: ? Influenza vaccine. Get this  vaccine each year before the flu season begins. ? Pneumonia vaccine. ? Shingles vaccine. ? Tetanus, diphtheria, and pertussis (Tdap) booster vaccine. Your health care provider may also recommend other immunizations. Tell your health care provider if you have ever been abused or do not feel safe at home. Summary  Menopause is a normal process in which your ability to get pregnant comes to an end.  This condition causes hot flashes, night sweats, decreased interest in sex, mood swings, headaches, or lack of sleep.  Treatment for this condition may include hormone replacement therapy.  Take actions to keep yourself healthy, including exercising regularly, eating a healthy diet, watching your weight, and checking your blood pressure and blood sugar levels.  Get screened for cancer and depression. Make sure that you are up to date with all your vaccines. This information is not intended to replace advice given to you by your health care provider. Make sure you discuss any questions you have with your health care provider. Document Revised: 12/18/2017 Document Reviewed: 12/18/2017 Elsevier Patient Education  2020 [employer].

## 2019-10-13 NOTE — Assessment & Plan Note (Signed)
Encouraged weight loss through low GI diet provided and discussed with patient today.

## 2019-10-14 ENCOUNTER — Encounter: Payer: Self-pay | Admitting: Hematology and Oncology

## 2019-10-14 ENCOUNTER — Other Ambulatory Visit: Payer: Self-pay

## 2019-10-14 DIAGNOSIS — C50911 Malignant neoplasm of unspecified site of right female breast: Secondary | ICD-10-CM

## 2019-10-14 LAB — HEPATITIS C ANTIBODY
Hepatitis C Ab: NONREACTIVE
SIGNAL TO CUT-OFF: 0.01 (ref <1.00)

## 2019-10-14 NOTE — Progress Notes (Signed)
No new changes noted today. The patient name and DOB has been verified by phone.

## 2019-10-14 NOTE — Progress Notes (Signed)
[facility]  [address], [address] [place], [place] [address] Phone: [phone]  Fax: [phone]   Clinic Day:  10/15/2019  Referring physician: Burnard Hawthorne, FNP  Chief Complaint: Teresa Franco is a 59 y.o. female with a history of Her2/neu + right breast cancer who is seen for 1 year assessment.  HPI: The patient was last seen in the medical oncology clinic on 10/09/2018. At that time, she denied any breast concerns.  Exam revealed left sided Bell's palsy. Hematocrit was 42.4, hemoglobin 14.0, platelets 245,000, WBC 8,500. Sodium was 134. Blood glucose was 211. AST was 40 and ALT 50. Bilirubin was 0.2. CA27.29 was 11.5. She was referred to neurology.  The patient saw Dr. Melrose Nakayama on 10/10/2018 for initial consult after an episode of Bell's palsy on 07/10/2018. If the patient had another episode of Bell's palsy, prednisone and Valtrex would be considered. She was to follow up prn.  Screening mammogram on 10/13/2019 revealed no evidence of malignancy.  Labs on 10/30/2018 revealed a hematocrit of 44.5, hemoglobin 14.5, platelets 271,000, WBC 8,200. Total protein was 8.7 and albumen 4.3.  AST was 47 and ALT was 55.  CA27.29 was 8.2.  During the interim, she has been "good." Her Bell's palsy is almost completely resolved; she just has a little bit of lingering numbness of the left side. She performs monthly breast self exams. She has rare hot flashes about once every 2-3 months.   Past Medical History:  Diagnosis Date  . Breast cancer ([place]) 2004   chemo/ radiation; Her2/neu +  . Dysrhythmia    palpitations  . Hyperlipidemia   . LGSIL on Pap smear of cervix   . Personal history of chemotherapy   . Personal history of radiation therapy   . Prediabetes   . Seasonal allergies   . UTI (urinary tract infection)     Past Surgical History:  Procedure Laterality Date  . BREAST BIOPSY Right 07/2002   positive  . BREAST BIOPSY Right 2010   negative   . BREAST LUMPECTOMY Right 08/09/2002   positive for breast cancer  . BROW LIFT Bilateral 06/25/2017   Procedure: BLEPHAROPLASTY UPPER EYELID WITH EXCESS SKIN;  Surgeon: Karle Starch, MD;  Location: [place];  Service: Ophthalmology;  Laterality: Bilateral;  . COLONOSCOPY  03/31/2010  . LEEP  04/10/1993  . OTHER SURGICAL HISTORY  06/2017   eyelid, took off excessive skin    Family History  Problem Relation Age of Onset  . Hyperlipidemia Mother   . Heart disease Mother   . Thyroid disease Mother   . Hyperlipidemia Father   . Heart disease Father 50       MI- died at 40 ? SCD  . Hypertension Father   . Breast cancer Maternal Aunt 69  . Breast cancer Maternal Aunt 60  . Colon cancer Neg Hx   . Ovarian cancer Neg Hx   . Melanoma Neg Hx     Social History:  reports that she quit smoking about 22 years ago. She has never used smokeless tobacco. She reports current alcohol use. She reports that she does not use drugs.  Patient has one daughter (age 58). 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  She lives in [place].  The patient is alone today.  Allergies:  Allergies  Allergen Reactions  . Sulfa Antibiotics Rash    Current Medications: Current Outpatient Medications  Medication Sig Dispense Refill  . Ascorbic Acid (VITAMIN C) 1000  MG tablet Take 1,000 mg by mouth daily.    Marland Kitchen BIOTIN PO Take by mouth daily.     . Cholecalciferol (VITAMIN D3 PO) Take by mouth daily.     Marland Kitchen ezetimibe (ZETIA) 10 MG tablet Take 1 tablet (10 mg total) by mouth daily. 90 tablet 3  . MEGARED OMEGA-3 KRILL OIL PO Take by mouth daily.     . Milk Thistle 175 MG CAPS Take by mouth daily.    . rosuvastatin (CRESTOR) 10 MG tablet Take 1 tablet (10 mg total) by mouth daily. 90 tablet 3  . VIT B12-METHIONINE-INOS-CHOL IM daily.      No current facility-administered medications for this visit.    Review of Systems  Constitutional: Negative.  Negative for chills, diaphoresis,  fever, malaise/fatigue and weight loss (up 1 lb).       Feels "good"  HENT: Negative.  Negative for congestion, ear discharge, ear pain, hearing loss, nosebleeds, sinus pain, sore throat and tinnitus.   Eyes: Negative.  Negative for blurred vision.  Respiratory: Negative.  Negative for cough, hemoptysis, sputum production and shortness of breath.   Cardiovascular: Negative.  Negative for chest pain, palpitations and leg swelling.  Gastrointestinal: Negative.  Negative for abdominal pain, blood in stool, constipation, diarrhea, heartburn, melena, nausea and vomiting.  Genitourinary: Negative.  Negative for dysuria, frequency, hematuria and urgency.  Musculoskeletal: Negative.  Negative for back pain, joint pain, myalgias and neck pain.  Skin: Negative.  Negative for itching and rash.  Neurological: Negative for dizziness, tingling, sensory change, weakness and headaches.       Bell's palsy almost completely resolved  Endo/Heme/Allergies: Does not bruise/bleed easily.       Hot flashes once every 2-3 months  Psychiatric/Behavioral: Negative.  Negative for depression and memory loss. The patient is not nervous/anxious and does not have insomnia.   All other systems reviewed and are negative.  Performance status (ECOG): 0  Vitals Blood pressure (!) 130/91, pulse 97, temperature 98.3 F (36.8 C), temperature source Tympanic, weight 200 lb 9.9 oz (91 kg), SpO2 99 %.  Physical Exam Vitals and nursing note reviewed.  Constitutional:      General: She is not in acute distress.    Appearance: She is well-developed. She is not diaphoretic.  HENT:     Head: Normocephalic and atraumatic.     Comments: Shoulder length brown hair.    Mouth/Throat:     Mouth: Mucous membranes are moist.     Pharynx: Oropharynx is clear. No oropharyngeal exudate.  Eyes:     General: No scleral icterus.    Extraocular Movements: Extraocular movements intact.     Conjunctiva/sclera: Conjunctivae normal.      Pupils: Pupils are equal, round, and reactive to light.     Comments: Glasses. Brown eyes.  Neck:     Vascular: No JVD.  Cardiovascular:     Rate and Rhythm: Normal rate and regular rhythm.     Heart sounds: Normal heart sounds. No murmur heard.   Pulmonary:     Effort: Pulmonary effort is normal. No respiratory distress.     Breath sounds: Normal breath sounds. No wheezing or rales.  Chest:     Chest wall: No tenderness.     Breasts:        Right: Normal. No swelling, mass, skin change or tenderness.        Left: Normal. No swelling, mass, skin change or tenderness.  Abdominal:     General: Bowel sounds are normal.  There is no distension.     Palpations: Abdomen is soft. There is no hepatomegaly, splenomegaly or mass.     Tenderness: There is no abdominal tenderness. There is no guarding or rebound.  Musculoskeletal:        General: No tenderness. Normal range of motion.     Cervical back: Normal range of motion and neck supple.  Lymphadenopathy:     Head:     Right side of head: No preauricular, posterior auricular or occipital adenopathy.     Left side of head: No preauricular, posterior auricular or occipital adenopathy.     Cervical: No cervical adenopathy.     Upper Body:     Right upper body: No supraclavicular or axillary adenopathy.     Left upper body: No supraclavicular or axillary adenopathy.     Lower Body: No right inguinal adenopathy. No left inguinal adenopathy.  Skin:    General: Skin is warm and dry.     Coloration: Skin is not pale.     Findings: No erythema or rash.  Neurological:     Mental Status: She is alert and oriented to person, place, and time.  Psychiatric:        Behavior: Behavior normal.        Thought Content: Thought content normal.        Judgment: Judgment normal.    Appointment on 10/15/2019  Component Date Value Ref Range Status  . WBC 10/15/2019 8.5  4.0 - 10.5 K/uL Final  . RBC 10/15/2019 4.76  3.87 - 5.11 MIL/uL Final  .  Hemoglobin 10/15/2019 13.6  12.0 - 15.0 g/dL Final  . HCT 10/15/2019 43.4  36 - 46 % Final  . MCV 10/15/2019 91.2  80.0 - 100.0 fL Final  . MCH 10/15/2019 28.6  26.0 - 34.0 pg Final  . MCHC 10/15/2019 31.3  30.0 - 36.0 g/dL Final  . RDW 10/15/2019 14.1  11.5 - 15.5 % Final  . Platelets 10/15/2019 264  150 - 400 K/uL Final  . nRBC 10/15/2019 0.0  0.0 - 0.2 % Final  . Neutrophils Relative % 10/15/2019 69  % Final  . Neutro Abs 10/15/2019 5.8  1.7 - 7.7 K/uL Final  . Lymphocytes Relative 10/15/2019 23  % Final  . Lymphs Abs 10/15/2019 2.0  0.7 - 4.0 K/uL Final  . Monocytes Relative 10/15/2019 5  % Final  . Monocytes Absolute 10/15/2019 0.4  0 - 1 K/uL Final  . Eosinophils Relative 10/15/2019 2  % Final  . Eosinophils Absolute 10/15/2019 0.2  0 - 0 K/uL Final  . Basophils Relative 10/15/2019 1  % Final  . Basophils Absolute 10/15/2019 0.1  0 - 0 K/uL Final  . Immature Granulocytes 10/15/2019 0  % Final  . Abs Immature Granulocytes 10/15/2019 0.02  0.00 - 0.07 K/uL Final   Performed at [facility], [address] [place], [place] [address]  . Sodium 10/15/2019 140  135 - 145 mmol/L Final  . Potassium 10/15/2019 4.3  3.5 - 5.1 mmol/L Final  . Chloride 10/15/2019 102  98 - 111 mmol/L Final  . CO2 10/15/2019 30  22 - 32 mmol/L Final  . Glucose, Bld 10/15/2019 164* 70 - 99 mg/dL Final   Glucose reference range applies only to samples taken after fasting for at least 8 hours.  . BUN 10/15/2019 10  6 - 20 mg/dL Final  . Creatinine, Ser 10/15/2019 0.81  0.44 - 1.00 mg/dL Final  . Calcium 10/15/2019  9.4  8.9 - 10.3 mg/dL Final  . Total Protein 10/15/2019 7.9  6.5 - 8.1 g/dL Final  . Albumin 10/15/2019 4.1  3.5 - 5.0 g/dL Final  . AST 10/15/2019 21  15 - 41 U/L Final  . ALT 10/15/2019 25  0 - 44 U/L Final  . Alkaline Phosphatase 10/15/2019 69  38 - 126 U/L Final  . Total Bilirubin 10/15/2019 0.6  0.3 - 1.2 mg/dL Final  . GFR calc non Af Amer 10/15/2019 >60  >60 mL/min Final  .  Anion gap 10/15/2019 8  5 - 15 Final   Performed at [facility], [address] [place], [place] [address]  Office Visit on 10/13/2019  Component Date Value Ref Range Status  . Sodium 10/13/2019 141  135 - 145 mEq/L Final  . Potassium 10/13/2019 4.6  3.5 - 5.1 mEq/L Final  . Chloride 10/13/2019 102  96 - 112 mEq/L Final  . CO2 10/13/2019 32  19 - 32 mEq/L Final  . Glucose, Bld 10/13/2019 109* 70 - 99 mg/dL Final  . BUN 10/13/2019 9  6 - 23 mg/dL Final  . Creatinine, Ser 10/13/2019 0.86  0.40 - 1.20 mg/dL Final  . Total Bilirubin 10/13/2019 0.5  0.2 - 1.2 mg/dL Final  . Alkaline Phosphatase 10/13/2019 71  39 - 117 U/L Final  . AST 10/13/2019 16  0 - 37 U/L Final  . ALT 10/13/2019 20  0 - 35 U/L Final  . Total Protein 10/13/2019 7.3  6.0 - 8.3 g/dL Final  . Albumin 10/13/2019 4.3  3.5 - 5.2 g/dL Final  . GFR 10/13/2019 74.45  >60.00 mL/min Final  . Calcium 10/13/2019 9.8  8.4 - 10.5 mg/dL Final  . Hgb A1c MFr Bld 10/13/2019 6.6* 4.6 - 6.5 % Final   Glycemic Control Guidelines for People with Diabetes:Non Diabetic:  <6%Goal of Therapy: <7%Additional Action Suggested:  >8%   . Hepatitis C Ab 10/13/2019 NON-REACTIVE  NON-REACTI Final  . SIGNAL TO CUT-OFF 10/13/2019 0.01  <1.00 Final   Comment: . HCV antibody was non-reactive. There is no laboratory  evidence of HCV infection. . In most cases, no further action is required. However, if recent HCV exposure is suspected, a test for HCV RNA (test code [id number]) is suggested. . For additional information please refer to http://education.questdiagn[facility].com/faq/FAQ22v1 (This link is being provided for informational/ educational purposes only.) .   Marland Kitchen Color, Urine 10/13/2019 YELLOW  Yellow;Lt. Yellow;Straw;Dark Yellow;Amber;Green;Red;Brown Final  . APPearance 10/13/2019 CLEAR  Clear;Turbid;Slightly Cloudy;Cloudy Final  . Specific Gravity, Urine 10/13/2019 1.020  1.000 - 1.030 Final  . pH 10/13/2019 6.0  5.0 - 8.0 Final  .  Total Protein, Urine 10/13/2019 NEGATIVE  Negative Final  . Urine Glucose 10/13/2019 NEGATIVE  Negative Final  . Ketones, ur 10/13/2019 NEGATIVE  Negative Final  . Bilirubin Urine 10/13/2019 NEGATIVE  Negative Final  . Hgb urine dipstick 10/13/2019 NEGATIVE  Negative Final  . Urobilinogen, UA 10/13/2019 0.2  0.0 - 1.0 Final  . Leukocytes,Ua 10/13/2019 TRACE* Negative Final  . Nitrite 10/13/2019 NEGATIVE  Negative Final  . WBC, UA 10/13/2019 3-6/hpf* 0-2/hpf Final  . RBC / HPF 10/13/2019 none seen  0-2/hpf Final  . Squamous Epithelial / LPF 10/13/2019 Rare(0-4/hpf)  Rare(0-4/hpf) Final  . WBC 10/13/2019 7.6  4.0 - 10.5 K/uL Final  . RBC 10/13/2019 4.60  3.87 - 5.11 Mil/uL Final  . Hemoglobin 10/13/2019 13.6  12.0 - 15.0 g/dL Final  . HCT 10/13/2019 41.1  36 - 46 %  Final  . MCV 10/13/2019 89.3  78.0 - 100.0 fl Final  . MCHC 10/13/2019 33.1  30.0 - 36.0 g/dL Final  . RDW 10/13/2019 14.4  11.5 - 15.5 % Final  . Platelets 10/13/2019 241.0  150 - 400 K/uL Final  . Neutrophils Relative % 10/13/2019 71.7  43 - 77 % Final  . Lymphocytes Relative 10/13/2019 20.3  12 - 46 % Final  . Monocytes Relative 10/13/2019 5.0  3 - 12 % Final  . Eosinophils Relative 10/13/2019 2.1  0 - 5 % Final  . Basophils Relative 10/13/2019 0.9  0 - 3 % Final  . Neutro Abs 10/13/2019 5.5  1.4 - 7.7 K/uL Final  . Lymphs Abs 10/13/2019 1.5  0.7 - 4.0 K/uL Final  . Monocytes Absolute 10/13/2019 0.4  0 - 1 K/uL Final  . Eosinophils Absolute 10/13/2019 0.2  0 - 0 K/uL Final  . Basophils Absolute 10/13/2019 0.1  0 - 0 K/uL Final  . Microalb, Ur 10/13/2019 <0.7  0.0 - 1.9 mg/dL Final  . Creatinine,U 10/13/2019 136.4  mg/dL Final  . Microalb Creat Ratio 10/13/2019 0.5  0.0 - 30.0 mg/g Final    Assessment:  RASHIA MCKESSON is a 59 y.o. female with Her2/neu + right breast cancer in 07/2012.  No pathology is available. Lymph nodes were positive.  Tumor was ER/PR positive and HER-2/neu positive.   She received neoadjuvant AC  followed by Taxol and Herceptin.  Lumpectomy revealed a complete pathologic response. She received radiation in 03/2003.   She received tamoxifen from 02/2003 - 02/2008. She received her Herceptin from 05/2003 - 05/2004.  She received extended adjuvant hormonal therapy with Lupron and Femara from 03/2008 - 04/2013.  She tolerated her treatment well.  Bilateral screening mammogram on 10/06/2018 revealed no evidence of malignancy.  Bilateral screening mammogram on 10/13/2019 revealed no evidence of malignancy.  CA27.29 has been followed: 15.3 on 08/14/2011, 16.2 on 02/14/2012, 16.0 on 04/14/2013, 9.2 on 11/23/2013, 14.3 on 09/22/2014, 13.3 on 10/04/2016, 9.4 on 10/03/2017, 11.5 on 10/09/2018, 8.2 on 10/30/2018, and 6.9 on 10/15/2019.  Bone density on 09/20/2011 was normal with a T-score of -0.7 of the right femoral neck.  She has a history of an elevated serum protein.  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  IgA and kappa and lambda typing were increased.  FLC ratio was normal.  She has a family history of hemochromatosis.   Iron studies on 10/19/2015 revealed an iron saturation of 18% and a ferritin of 125.  She received the [place] COVID-19 vaccination on 03/31/2019 and 04/21/2019.  Symptomatically, she feels "good."  She has rare hot flashes.  Exam is stable.  Plan: 1.   Labs today: CBC with diff, CMP, CA27.29 2.   Her2/neu + breast cancer             Clinically, she continues to do well.  Exam reveals no evidence of recurrent disease.  Bilateral screening mammogram on 10/13/2019 revealed no evidence of malignancy.  Continue yearly assessment. 3.   Elevated serum protein             Review labs from last visit.  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 4.   Mild elevated LFTs, resolved  AST 40 and ALT 50 (0-44) on 10/09/2018.  AST 47 and ALT 55 on 10/30/2018.  AST 29 and ALT 35 on 04/17/2019.  AST 21 and ALT 25 on 10/15/2019.  Continue to  monitor. 5.   Bell's palsy,  resolving  Review interval consultation. 6.   Bone density on 01/29/2020. 7.   Bilateral mammogram 10/12/2020. 8.   RTC in 1 year for MD assessment, labs (CBC with diff, CMP, CA27.29), and review of imaging.  I discussed the assessment and treatment plan with the patient.  The patient was provided an opportunity to ask questions and all were answered.  The patient agreed with the plan and demonstrated an understanding of the instructions.  The patient was advised to call back if the symptoms worsen or if the condition fails to improve as anticipated.   Lequita Asal, MD, PhD    10/15/2019, 9:43 AM  I, Mirian Mo Tufford, am acting as Education administrator for [employer]. Mike Gip, MD, PhD.  I, Lleyton Byers C. Mike Gip, MD, have reviewed the above documentation for accuracy and completeness, and I agree with the above.

## 2019-10-15 ENCOUNTER — Inpatient Hospital Stay: Payer: 59 | Attending: Hematology and Oncology

## 2019-10-15 ENCOUNTER — Encounter: Payer: Self-pay | Admitting: Hematology and Oncology

## 2019-10-15 ENCOUNTER — Inpatient Hospital Stay (HOSPITAL_BASED_OUTPATIENT_CLINIC_OR_DEPARTMENT_OTHER): Payer: 59 | Admitting: Hematology and Oncology

## 2019-10-15 VITALS — BP 130/91 | HR 97 | Temp 98.3°F | Wt 200.6 lb

## 2019-10-15 DIAGNOSIS — G510 Bell's palsy: Secondary | ICD-10-CM

## 2019-10-15 DIAGNOSIS — N951 Menopausal and female climacteric states: Secondary | ICD-10-CM | POA: Insufficient documentation

## 2019-10-15 DIAGNOSIS — R232 Flushing: Secondary | ICD-10-CM | POA: Diagnosis not present

## 2019-10-15 DIAGNOSIS — Z853 Personal history of malignant neoplasm of breast: Secondary | ICD-10-CM | POA: Diagnosis not present

## 2019-10-15 DIAGNOSIS — Z170 Estrogen receptor positive status [ER+]: Secondary | ICD-10-CM | POA: Insufficient documentation

## 2019-10-15 DIAGNOSIS — Z9221 Personal history of antineoplastic chemotherapy: Secondary | ICD-10-CM | POA: Diagnosis not present

## 2019-10-15 DIAGNOSIS — Z87891 Personal history of nicotine dependence: Secondary | ICD-10-CM | POA: Insufficient documentation

## 2019-10-15 DIAGNOSIS — Z0000 Encounter for general adult medical examination without abnormal findings: Secondary | ICD-10-CM | POA: Diagnosis not present

## 2019-10-15 DIAGNOSIS — Z8349 Family history of other endocrine, nutritional and metabolic diseases: Secondary | ICD-10-CM | POA: Insufficient documentation

## 2019-10-15 DIAGNOSIS — R7989 Other specified abnormal findings of blood chemistry: Secondary | ICD-10-CM | POA: Diagnosis not present

## 2019-10-15 DIAGNOSIS — E785 Hyperlipidemia, unspecified: Secondary | ICD-10-CM | POA: Insufficient documentation

## 2019-10-15 DIAGNOSIS — Z803 Family history of malignant neoplasm of breast: Secondary | ICD-10-CM | POA: Insufficient documentation

## 2019-10-15 DIAGNOSIS — C50911 Malignant neoplasm of unspecified site of right female breast: Secondary | ICD-10-CM | POA: Diagnosis not present

## 2019-10-15 DIAGNOSIS — Z79899 Other long term (current) drug therapy: Secondary | ICD-10-CM | POA: Insufficient documentation

## 2019-10-15 DIAGNOSIS — Z8249 Family history of ischemic heart disease and other diseases of the circulatory system: Secondary | ICD-10-CM | POA: Insufficient documentation

## 2019-10-15 DIAGNOSIS — D890 Polyclonal hypergammaglobulinemia: Secondary | ICD-10-CM | POA: Diagnosis not present

## 2019-10-15 LAB — CBC WITH DIFFERENTIAL/PLATELET
Abs Immature Granulocytes: 0.02 10*3/uL (ref 0.00–0.07)
Basophils Absolute: 0.1 10*3/uL (ref 0.0–0.1)
Basophils Relative: 1 %
Eosinophils Absolute: 0.2 10*3/uL (ref 0.0–0.5)
Eosinophils Relative: 2 %
HCT: 43.4 % (ref 36.0–46.0)
Hemoglobin: 13.6 g/dL (ref 12.0–15.0)
Immature Granulocytes: 0 %
Lymphocytes Relative: 23 %
Lymphs Abs: 2 10*3/uL (ref 0.7–4.0)
MCH: 28.6 pg (ref 26.0–34.0)
MCHC: 31.3 g/dL (ref 30.0–36.0)
MCV: 91.2 fL (ref 80.0–100.0)
Monocytes Absolute: 0.4 10*3/uL (ref 0.1–1.0)
Monocytes Relative: 5 %
Neutro Abs: 5.8 10*3/uL (ref 1.7–7.7)
Neutrophils Relative %: 69 %
Platelets: 264 10*3/uL (ref 150–400)
RBC: 4.76 MIL/uL (ref 3.87–5.11)
RDW: 14.1 % (ref 11.5–15.5)
WBC: 8.5 10*3/uL (ref 4.0–10.5)
nRBC: 0 % (ref 0.0–0.2)

## 2019-10-15 LAB — COMPREHENSIVE METABOLIC PANEL
ALT: 25 U/L (ref 0–44)
AST: 21 U/L (ref 15–41)
Albumin: 4.1 g/dL (ref 3.5–5.0)
Alkaline Phosphatase: 69 U/L (ref 38–126)
Anion gap: 8 (ref 5–15)
BUN: 10 mg/dL (ref 6–20)
CO2: 30 mmol/L (ref 22–32)
Calcium: 9.4 mg/dL (ref 8.9–10.3)
Chloride: 102 mmol/L (ref 98–111)
Creatinine, Ser: 0.81 mg/dL (ref 0.44–1.00)
GFR calc non Af Amer: >60 mL/min (ref >60)
Glucose, Bld: 164 mg/dL — ABNORMAL HIGH (ref 70–99)
Potassium: 4.3 mmol/L (ref 3.5–5.1)
Sodium: 140 mmol/L (ref 135–145)
Total Bilirubin: 0.6 mg/dL (ref 0.3–1.2)
Total Protein: 7.9 g/dL (ref 6.5–8.1)

## 2019-10-15 LAB — NICOTINE SCREEN, URINE: Cotinine Ql Scrn, Ur: NEGATIVE ng/mL

## 2019-10-16 ENCOUNTER — Encounter: Payer: Self-pay | Admitting: Family

## 2019-10-16 DIAGNOSIS — R7303 Prediabetes: Secondary | ICD-10-CM | POA: Insufficient documentation

## 2019-10-16 DIAGNOSIS — Z8639 Personal history of other endocrine, nutritional and metabolic disease: Secondary | ICD-10-CM | POA: Insufficient documentation

## 2019-10-16 DIAGNOSIS — E119 Type 2 diabetes mellitus without complications: Secondary | ICD-10-CM | POA: Insufficient documentation

## 2019-10-16 LAB — CYTOLOGY - PAP
Comment: NEGATIVE
Diagnosis: NEGATIVE
High risk HPV: NEGATIVE

## 2019-10-16 LAB — CANCER ANTIGEN 27.29: CA 27.29: 6.9 U/mL (ref 0.0–38.6)

## 2019-10-19 ENCOUNTER — Other Ambulatory Visit: Payer: Self-pay

## 2019-10-19 ENCOUNTER — Telehealth (INDEPENDENT_AMBULATORY_CARE_PROVIDER_SITE_OTHER): Payer: Self-pay | Admitting: Gastroenterology

## 2019-10-19 DIAGNOSIS — Z1211 Encounter for screening for malignant neoplasm of colon: Secondary | ICD-10-CM

## 2019-10-19 MED ORDER — PEG 3350-KCL-NA BICARB-NACL 420 G PO SOLR: 4000.0000 mL | Freq: Once | ORAL | 0 refills | Status: AC

## 2019-10-19 NOTE — Progress Notes (Signed)
Gastroenterology Pre-Procedure Review  Request Date: Thursday  11/12/19 Requesting Physician: Dr. Vicente Males  PATIENT REVIEW QUESTIONS: The patient responded to the following health history questions as indicated:    1. Are you having any GI issues? no 2. Do you have a personal history of Polyps? no 3. Do you have a family history of Colon Cancer or Polyps? no 4. Diabetes Mellitus? Yes controlled with diet and exercise 5. Joint replacements in the past 12 months?no 6. Major health problems in the past 3 months?no 7. Any artificial heart valves, MVP, or defibrillator?no    MEDICATIONS & ALLERGIES:    Patient reports the following regarding taking any anticoagulation/antiplatelet therapy:   Plavix, Coumadin, Eliquis, Xarelto, Lovenox, Pradaxa, Brilinta, or Effient? no Aspirin? no  Patient confirms/reports the following medications:  Current Outpatient Medications  Medication Sig Dispense Refill  . Ascorbic Acid (VITAMIN C) 1000 MG tablet Take 1,000 mg by mouth daily.    Marland Kitchen BIOTIN PO Take by mouth daily.     . Cholecalciferol (VITAMIN D3 PO) Take by mouth daily.     Marland Kitchen MEGARED OMEGA-3 KRILL OIL PO Take by mouth daily.     . Milk Thistle 175 MG CAPS Take by mouth daily.    . rosuvastatin (CRESTOR) 10 MG tablet Take 1 tablet (10 mg total) by mouth daily. 90 tablet 3  . VIT B12-METHIONINE-INOS-CHOL IM daily.     Marland Kitchen ezetimibe (ZETIA) 10 MG tablet Take 1 tablet (10 mg total) by mouth daily. 90 tablet 3   No current facility-administered medications for this visit.    Patient confirms/reports the following allergies:  Allergies  Allergen Reactions  . Sulfa Antibiotics Rash    No orders of the defined types were placed in this encounter.   AUTHORIZATION INFORMATION Primary Insurance: 1D#: Group #:  Secondary Insurance: 1D#: Group #:  SCHEDULE INFORMATION: Date: 11/12/19 Time: Location:[facility]

## 2019-10-27 ENCOUNTER — Other Ambulatory Visit: Payer: Self-pay

## 2019-10-27 ENCOUNTER — Ambulatory Visit (INDEPENDENT_AMBULATORY_CARE_PROVIDER_SITE_OTHER): Payer: 59

## 2019-10-27 DIAGNOSIS — Z23 Encounter for immunization: Secondary | ICD-10-CM

## 2019-10-27 NOTE — Progress Notes (Signed)
Patient presented for shingrix injection to left deltoid, patient voiced no concerns nor showed any signs of distress during injection.

## 2019-10-29 ENCOUNTER — Ambulatory Visit
Admission: RE | Admit: 2019-10-29 | Discharge: 2019-10-29 | Disposition: A | Discharge status: Home / Self Care | Payer: 59 | Source: Ambulatory Visit | Attending: Family | Admitting: Family

## 2019-10-29 ENCOUNTER — Other Ambulatory Visit: Payer: Self-pay

## 2019-10-29 DIAGNOSIS — K7581 Nonalcoholic steatohepatitis (NASH): Secondary | ICD-10-CM

## 2019-10-30 ENCOUNTER — Other Ambulatory Visit: Payer: Self-pay | Admitting: Family

## 2019-10-30 DIAGNOSIS — K7581 Nonalcoholic steatohepatitis (NASH): Secondary | ICD-10-CM

## 2019-11-10 ENCOUNTER — Other Ambulatory Visit
Admission: RE | Admit: 2019-11-10 | Discharge: 2019-11-10 | Disposition: A | Discharge status: Home / Self Care | Payer: 59 | Source: Ambulatory Visit | Attending: Gastroenterology | Admitting: Gastroenterology

## 2019-11-10 ENCOUNTER — Other Ambulatory Visit: Payer: Self-pay

## 2019-11-10 DIAGNOSIS — Z01812 Encounter for preprocedural laboratory examination: Secondary | ICD-10-CM | POA: Diagnosis not present

## 2019-11-10 DIAGNOSIS — Z20822 Contact with and (suspected) exposure to covid-19: Secondary | ICD-10-CM | POA: Insufficient documentation

## 2019-11-10 LAB — SARS CORONAVIRUS 2 (TAT 6-24 HRS): SARS Coronavirus 2: NEGATIVE

## 2019-11-12 ENCOUNTER — Other Ambulatory Visit: Payer: Self-pay

## 2019-11-12 ENCOUNTER — Ambulatory Visit: Payer: 59 | Admitting: Certified Registered Nurse Anesthetist

## 2019-11-12 ENCOUNTER — Encounter
Admission: RE | Disposition: A | Discharge status: Home / Self Care | Payer: Self-pay | Source: Home / Self Care | Attending: Gastroenterology

## 2019-11-12 ENCOUNTER — Encounter: Payer: Self-pay | Admitting: Gastroenterology

## 2019-11-12 ENCOUNTER — Ambulatory Visit
Admission: RE | Admit: 2019-11-12 | Discharge: 2019-11-12 | Disposition: A | Discharge status: Home / Self Care | Payer: 59 | Attending: Gastroenterology | Admitting: Gastroenterology

## 2019-11-12 DIAGNOSIS — Z1211 Encounter for screening for malignant neoplasm of colon: Secondary | ICD-10-CM | POA: Insufficient documentation

## 2019-11-12 DIAGNOSIS — Z882 Allergy status to sulfonamides status: Secondary | ICD-10-CM | POA: Diagnosis not present

## 2019-11-12 DIAGNOSIS — K5730 Diverticulosis of large intestine without perforation or abscess without bleeding: Secondary | ICD-10-CM | POA: Diagnosis not present

## 2019-11-12 DIAGNOSIS — Z87891 Personal history of nicotine dependence: Secondary | ICD-10-CM | POA: Diagnosis not present

## 2019-11-12 HISTORY — PX: COLONOSCOPY WITH PROPOFOL: SHX5780

## 2019-11-12 SURGERY — COLONOSCOPY WITH PROPOFOL
Anesthesia: General

## 2019-11-12 MED ORDER — PROPOFOL 500 MG/50ML IV EMUL
INTRAVENOUS | Status: DC | PRN
  Administered 2019-11-12: 125 ug/kg/min via INTRAVENOUS

## 2019-11-12 MED ORDER — PROPOFOL 500 MG/50ML IV EMUL
INTRAVENOUS | Status: AC
  Filled 2019-11-12: qty 50

## 2019-11-12 MED ORDER — LIDOCAINE HCL (CARDIAC) PF 100 MG/5ML IV SOSY
PREFILLED_SYRINGE | INTRAVENOUS | Status: DC | PRN
  Administered 2019-11-12: 50 mg via INTRAVENOUS

## 2019-11-12 MED ORDER — SODIUM CHLORIDE 0.9 % IV SOLN: INTRAVENOUS | Status: DC

## 2019-11-12 MED ORDER — PROPOFOL 10 MG/ML IV BOLUS
INTRAVENOUS | Status: DC | PRN
  Administered 2019-11-12 (×2): 40 mg via INTRAVENOUS

## 2019-11-12 MED ORDER — PROPOFOL 10 MG/ML IV BOLUS
INTRAVENOUS | Status: AC
  Filled 2019-11-12: qty 20

## 2019-11-12 NOTE — H&P (Signed)
Jonathon Bellows, MD [address] [place], [place], [place], [address] [address], [place], [place], [place], [address] Phone: [phone]  Fax: [phone]  Primary Care Physician:  Burnard Hawthorne, FNP   Pre-Procedure History & Physical: HPI:  Teresa Franco is a 59 y.o. female is here for an colonoscopy.   Past Medical History:  Diagnosis Date  . Breast cancer ([place]) 2004   chemo/ radiation; Her2/neu +  . Dysrhythmia    palpitations  . Hyperlipidemia   . LGSIL on Pap smear of cervix   . Personal history of chemotherapy   . Personal history of radiation therapy   . Prediabetes   . Seasonal allergies   . UTI (urinary tract infection)     Past Surgical History:  Procedure Laterality Date  . BREAST BIOPSY Right 07/2002   positive  . BREAST BIOPSY Right 2010   negative  . BREAST LUMPECTOMY Right 08/09/2002   positive for breast cancer  . BROW LIFT Bilateral 06/25/2017   Procedure: BLEPHAROPLASTY UPPER EYELID WITH EXCESS SKIN;  Surgeon: Karle Starch, MD;  Location: [place];  Service: Ophthalmology;  Laterality: Bilateral;  . COLONOSCOPY  03/31/2010  . LEEP  04/10/1993  . OTHER SURGICAL HISTORY  06/2017   eyelid, took off excessive skin    Prior to Admission medications   Medication Sig Start Date End Date Taking? Authorizing Provider  Ascorbic Acid (VITAMIN C) 1000 MG tablet Take 1,000 mg by mouth daily.   Yes [provider]  BIOTIN PO Take by mouth daily.    Yes [provider]  Cholecalciferol (VITAMIN D3 PO) Take by mouth daily.    Yes [provider]  MEGARED OMEGA-3 KRILL OIL PO Take by mouth daily.    Yes [provider]  Milk Thistle 175 MG CAPS Take by mouth daily.   Yes [provider]  rosuvastatin (CRESTOR) 10 MG tablet Take 1 tablet (10 mg total) by mouth daily. 04/20/19  Yes Minna Merritts, MD  VIT B12-METHIONINE-INOS-CHOL IM daily.    Yes [provider]  ezetimibe (ZETIA)  10 MG tablet Take 1 tablet (10 mg total) by mouth daily. 04/20/19 10/14/19  Minna Merritts, MD    Allergies as of 10/19/2019 - Review Complete 10/19/2019  Allergen Reaction Noted  . Sulfa antibiotics Rash 05/02/2014    Family History  Problem Relation Age of Onset  . Hyperlipidemia Mother   . Heart disease Mother   . Thyroid disease Mother   . Hyperlipidemia Father   . Heart disease Father 30       MI- died at 69 ? SCD  . Hypertension Father   . Breast cancer Maternal Aunt 65  . Breast cancer Maternal Aunt 60  . Colon cancer Neg Hx   . Ovarian cancer Neg Hx   . Melanoma Neg Hx     Social History   Socioeconomic History  . Marital status: Married    Spouse name: Not on file  . Number of children: Not on file  . Years of education: Not on file  . Highest education level: Not on file  Occupational History  . Not on file  Tobacco Use  . Smoking status: Former Smoker    Quit date: 01/08/1997    Years since quitting: 22.8  . Smokeless tobacco: Never Used  Vaping Use  . Vaping Use: Never used  Substance and Sexual Activity  . Alcohol use: Yes    Alcohol/week: 0.0  standard drinks  . Drug use: No  . Sexual activity: Yes    Birth control/protection: Post-menopausal  Other Topics Concern  . Not on file  Social History Narrative   From [place]   Works for telecommunication    1 daughter            Social Determinants of Health   Financial Resource Strain:   . Difficulty of Paying Living Expenses: Not on file  Food Insecurity:   . Worried About Charity fundraiser in the Last Year: Not on file  . Ran Out of Food in the Last Year: Not on file  Transportation Needs:   . Lack of Transportation (Medical): Not on file  . Lack of Transportation (Non-Medical): Not on file  Physical Activity:   . Days of Exercise per Week: Not on file  . Minutes of Exercise per Session: Not on file  Stress:   . Feeling of Stress : Not on file  Social Connections:   . Frequency of  Communication with Friends and Family: Not on file  . Frequency of Social Gatherings with Friends and Family: Not on file  . Attends Religious Services: Not on file  . Active Member of Clubs or Organizations: Not on file  . Attends Archivist Meetings: Not on file  . Marital Status: Not on file  Intimate Partner Violence:   . Fear of Current or Ex-Partner: Not on file  . Emotionally Abused: Not on file  . Physically Abused: Not on file  . Sexually Abused: Not on file    Review of Systems: See HPI, otherwise negative ROS  Physical Exam: BP (!) 125/1   Pulse (!) 109   Temp (!) 97.4 F (36.3 C) (Temporal)   Resp 16   Ht 5' 8"  (1.727 m)   Wt 86.2 kg   SpO2 100%   BMI 28.89 kg/m  General:   Alert,  pleasant and cooperative in NAD Head:  Normocephalic and atraumatic. Neck:  Supple; no masses or thyromegaly. Lungs:  Clear throughout to auscultation, normal respiratory effort.    Heart:  +S1, +S2, Regular rate and rhythm, No edema. Abdomen:  Soft, nontender and nondistended. Normal bowel sounds, without guarding, and without rebound.   Neurologic:  Alert and  oriented x4;  grossly normal neurologically.  Impression/Plan: [name] is here for an colonoscopy to be performed for Screening colonoscopy average risk   Risks, benefits, limitations, and alternatives regarding  colonoscopy have been reviewed with the patient.  Questions have been answered.  All parties agreeable.   Jonathon Bellows, MD  11/12/2019, 8:39 AM

## 2019-11-12 NOTE — Anesthesia Preprocedure Evaluation (Signed)
Anesthesia Evaluation  Patient identified by MRN, date of birth, ID band Patient awake    Reviewed: Allergy & Precautions, NPO status , Patient's Chart, lab work & pertinent test results  Airway Mallampati: II  TM Distance: >3 FB Neck ROM: Full    Dental no notable dental hx.    Pulmonary neg pulmonary ROS, former smoker,    Pulmonary exam normal breath sounds clear to auscultation       Cardiovascular negative cardio ROS Normal cardiovascular exam Rhythm:Regular Rate:Normal  H/o palpitations. None for over a year. Pt does not stop daily activities if she feels it. Cardiac workup done.  No diagnosis. Mets 4+   Neuro/Psych  Neuromuscular disease negative psych ROS   GI/Hepatic negative GI ROS, (+) Hepatitis -  Endo/Other  diabetes  Renal/GU negative Renal ROS  negative genitourinary   Musculoskeletal negative musculoskeletal ROS (+)   Abdominal   Peds negative pediatric ROS (+)  Hematology negative hematology ROS (+)   Anesthesia Other Findings Past Medical History: 2004: Breast cancer (HCC)     Comment:  chemo/ radiation; Her2/neu + No date: Dysrhythmia     Comment:  palpitations No date: Hyperlipidemia No date: LGSIL on Pap smear of cervix No date: Personal history of chemotherapy No date: Personal history of radiation therapy No date: Prediabetes No date: Seasonal allergies No date: UTI (urinary tract infection)  Reproductive/Obstetrics negative OB ROS                             Anesthesia Physical  Anesthesia Plan  ASA: II  Anesthesia Plan: General   Post-op Pain Management:    Induction: Intravenous  PONV Risk Score and Plan: 2 and Propofol infusion  Airway Management Planned: Natural Airway and Nasal Cannula  Additional Equipment:   Intra-op Plan:   Post-operative Plan:   Informed Consent: I have reviewed the patients History and Physical, chart, labs and  discussed the procedure including the risks, benefits and alternatives for the proposed anesthesia with the patient or authorized representative who has indicated his/her understanding and acceptance.     Dental advisory given  Plan Discussed with: CRNA  Anesthesia Plan Comments:         Anesthesia Quick Evaluation  

## 2019-11-12 NOTE — Transfer of Care (Signed)
Immediate Anesthesia Transfer of Care Note  Patient: Teresa Franco  Procedure(s) Performed: COLONOSCOPY WITH PROPOFOL (N/A )  Patient Location: PACU  Anesthesia Type:General  Level of Consciousness: awake, alert  and oriented  Airway & Oxygen Therapy: Patient Spontanous Breathing and Patient connected to nasal cannula oxygen  Post-op Assessment: Report given to RN and Post -op Vital signs reviewed and stable  Post vital signs: Reviewed and stable  Last Vitals:  Vitals Value Taken Time  BP    Temp    Pulse    Resp    SpO2      Last Pain:  Vitals:   11/12/19 0745  TempSrc: Temporal  PainSc: 0-No pain         Complications: No complications documented.

## 2019-11-12 NOTE — Anesthesia Postprocedure Evaluation (Signed)
Anesthesia Post Note  Patient: Teresa Franco  Procedure(s) Performed: COLONOSCOPY WITH PROPOFOL (N/A )  Patient location during evaluation: Endoscopy Anesthesia Type: General Level of consciousness: awake and alert and oriented Pain management: pain level controlled Vital Signs Assessment: post-procedure vital signs reviewed and stable Respiratory status: spontaneous breathing Cardiovascular status: blood pressure returned to baseline Anesthetic complications: no   No complications documented.   Last Vitals:  Vitals:   11/12/19 0913 11/12/19 0923  BP: 117/84 131/82  Pulse: (!) 106 96  Resp: 17 15  Temp:    SpO2: 96% 100%    Last Pain:  Vitals:   11/12/19 0923  TempSrc:   PainSc: 0-No pain                 Merek Niu

## 2019-11-12 NOTE — Op Note (Signed)
[facility] Gastroenterology Patient Name: Teresa Franco Procedure Date: 11/12/2019 8:42 AM MRN: [id number] Account #: [id number] Date of Birth: [birth year] Admit Type: Outpatient Age: 59 Room: [facility] ENDO ROOM 3 Gender: Female Note Status: Finalized Procedure:             Colonoscopy Indications:           Screening for colorectal malignant neoplasm Providers:             Jonathon Bellows MD, MD Referring MD:          No Local Md, MD (Referring MD) Medicines:             Monitored Anesthesia Care Complications:         No immediate complications. Procedure:             Pre-Anesthesia Assessment:                        - Prior to the procedure, a History and Physical was                         performed, and patient medications, allergies and                         sensitivities were reviewed. The patient's tolerance                         of previous anesthesia was reviewed.                        - The risks and benefits of the procedure and the                         sedation options and risks were discussed with the                         patient. All questions were answered and informed                         consent was obtained.                        - ASA Grade Assessment: II - A patient with mild                         systemic disease.                        After obtaining informed consent, the colonoscope was                         passed under direct vision. Throughout the procedure,                         the patient's blood pressure, pulse, and oxygen                         saturations were monitored continuously. The                         Colonoscope was introduced through the anus  and                         advanced to the the cecum, identified by the                         appendiceal orifice. The colonoscopy was performed                         with ease. The patient tolerated the procedure well.                         The quality of  the bowel preparation was excellent. Findings:      The perianal and digital rectal examinations were normal.      Multiple small-mouthed diverticula were found in the sigmoid colon.      The exam was otherwise without abnormality on direct and retroflexion       views. Impression:            - Diverticulosis in the sigmoid colon.                        - The examination was otherwise normal on direct and                         retroflexion views.                        - No specimens collected. Recommendation:        - Discharge patient to home (with escort).                        - Resume previous diet.                        - Continue present medications.                        - Repeat colonoscopy in 10 years for screening                         purposes. Procedure Code(s):     --- Professional ---                        [phone], Colonoscopy, flexible; diagnostic, including                         collection of specimen(s) by brushing or washing, when                         performed (separate procedure) Diagnosis Code(s):     --- Professional ---                        Z12.11, Encounter for screening for malignant neoplasm                         of colon                        K57.30, Diverticulosis of large intestine without  perforation or abscess without bleeding CPT copyright 2019 American Medical Association. All rights reserved. The codes documented in this report are preliminary and upon coder review may  be revised to meet current compliance requirements. Jonathon Bellows, MD Jonathon Bellows MD, MD 11/12/2019 9:03:19 AM This report has been signed electronically. Number of Addenda: 0 Note Initiated On: 11/12/2019 8:42 AM Scope Withdrawal Time: 0 hours 8 minutes 52 seconds  Total Procedure Duration: 0 hours 12 minutes 0 seconds  Estimated Blood Loss:  Estimated blood loss: none.      [facility]

## 2019-11-13 ENCOUNTER — Encounter: Payer: Self-pay | Admitting: Gastroenterology

## 2019-11-20 ENCOUNTER — Encounter: Payer: 59 | Admitting: Family

## 2019-11-23 ENCOUNTER — Other Ambulatory Visit: Payer: Self-pay

## 2019-11-23 ENCOUNTER — Ambulatory Visit
Admission: RE | Admit: 2019-11-23 | Discharge: 2019-11-23 | Disposition: A | Discharge status: Home / Self Care | Payer: 59 | Source: Ambulatory Visit | Attending: Hematology and Oncology | Admitting: Hematology and Oncology

## 2019-11-23 ENCOUNTER — Encounter: Payer: Self-pay | Admitting: Hematology and Oncology

## 2019-11-23 DIAGNOSIS — C50911 Malignant neoplasm of unspecified site of right female breast: Secondary | ICD-10-CM | POA: Diagnosis present

## 2019-11-23 DIAGNOSIS — Z0000 Encounter for general adult medical examination without abnormal findings: Secondary | ICD-10-CM | POA: Diagnosis present

## 2019-11-23 DIAGNOSIS — Z170 Estrogen receptor positive status [ER+]: Secondary | ICD-10-CM | POA: Diagnosis present

## 2019-11-23 DIAGNOSIS — Z0279 Encounter for issue of other medical certificate: Secondary | ICD-10-CM

## 2019-11-23 NOTE — Progress Notes (Signed)
Cardiology Office Note  Date:  11/25/2019   ID:  DAILEY ALBERSON, DOB 05/27/1960, MRN [id number]  PCP:  Burnard Hawthorne, FNP   Chief Complaint  Patient presents with  . Follow-up    12 month/ Meds reviewed by the pt. verbally. "doing well."     HPI:  Teresa Franco is a very pleasant 59 year old woman with past medical history of Hyperlipidemia Prediabetes, hemoglobin A1c 6.0 Smoker, stopped 19 years, smoked for 4 yrs Father died early 78s from cardiac related complications, cause unclear Who presents for f/u of her family history of heart disease and hyperlipidemia   in follow-up today reports that she is doing well Watching her diet, down close to 20 pounds Exercising on a regular basis No chest pain or shortness of breath on exertion Made these changes after hemoglobin A1c 6.6 and she wanted to get the numbers down  CT coronary calcium score result reviewed Calcium score of 26 Calcium in the LAD, RCA fatty liver at that time  Previous Total cholesterol 273 LDL 191 LFTs have been trending up Most recent lipid numbers below, reviewed with her  Lab Results  Component Value Date   CHOL 174 04/17/2019   HDL 55 04/17/2019   LDLCALC 101 (H) 04/17/2019   TRIG 102 04/17/2019    HBA1C 6.0 up to 6.6  EKG personally reviewed by myself on todays visit Shows normal sinus rhythm rate 98 bpm no significant ST-T wave changes  Family history Mom is 10, no heart issues Dad died early 8s   PMH:   has a past medical history of Breast cancer ([place]) (2004), Dysrhythmia, Hyperlipidemia, LGSIL on Pap smear of cervix, Personal history of chemotherapy, Personal history of radiation therapy, Prediabetes, Seasonal allergies, and UTI (urinary tract infection).  PSH:    Past Surgical History:  Procedure Laterality Date  . BREAST BIOPSY Right 07/2002   positive  . BREAST BIOPSY Right 2010   negative  . BREAST LUMPECTOMY Right 08/09/2002   positive for breast cancer  . BROW LIFT  Bilateral 06/25/2017   Procedure: BLEPHAROPLASTY UPPER EYELID WITH EXCESS SKIN;  Surgeon: Karle Starch, MD;  Location: [place];  Service: Ophthalmology;  Laterality: Bilateral;  . COLONOSCOPY  03/31/2010  . COLONOSCOPY WITH PROPOFOL N/A 11/12/2019   Procedure: COLONOSCOPY WITH PROPOFOL;  Surgeon: Jonathon Bellows, MD;  Location: [facility] ENDOSCOPY;  Service: Gastroenterology;  Laterality: N/A;  . LEEP  04/10/1993  . OTHER SURGICAL HISTORY  06/2017   eyelid, took off excessive skin    Current Outpatient Medications  Medication Sig Dispense Refill  . Ascorbic Acid (VITAMIN C) 1000 MG tablet Take 1,000 mg by mouth daily.    Marland Kitchen BIOTIN PO Take by mouth daily.     . Cholecalciferol (VITAMIN D3 PO) Take by mouth daily.     Marland Kitchen ezetimibe (ZETIA) 10 MG tablet Take 1 tablet (10 mg total) by mouth daily. 90 tablet 3  . MEGARED OMEGA-3 KRILL OIL PO Take by mouth daily.     . Milk Thistle 175 MG CAPS Take by mouth daily.    . rosuvastatin (CRESTOR) 10 MG tablet Take 1 tablet (10 mg total) by mouth daily. 90 tablet 3  . VIT B12-METHIONINE-INOS-CHOL IM daily.      No current facility-administered medications for this visit.     Allergies:   Sulfa antibiotics   Social History:  The patient  reports that she quit smoking about 22 years ago. She has never used smokeless tobacco. She reports current  alcohol use. She reports that she does not use drugs.   Family History:   family history includes Breast cancer (age of onset: 58) in her maternal aunt; Breast cancer (age of onset: 61) in her maternal aunt; Heart disease in her mother; Heart disease (age of onset: 35) in her father; Hyperlipidemia in her father and mother; Hypertension in her father; Thyroid disease in her mother.    Review of Systems: Review of Systems  Constitutional: Negative.   Respiratory: Negative.   Cardiovascular: Negative.   Gastrointestinal: Negative.   Musculoskeletal: Negative.   Neurological: Negative.    Psychiatric/Behavioral: Negative.   All other systems reviewed and are negative.   PHYSICAL EXAM: VS:  BP 120/60 (BP Location: Left Arm, Patient Position: Sitting, Cuff Size: Normal)   Pulse 98   Ht 5\' 8"  (1.727 m)   Wt 187 lb (84.8 kg)   SpO2 98%   BMI 28.43 kg/m  , BMI Body mass index is 28.43 kg/m. Constitutional:  oriented to person, place, and time. No distress.  HENT:  Head: Grossly normal Eyes:  no discharge. No scleral icterus.  Neck: No JVD, no carotid bruits  Cardiovascular: Regular rate and rhythm, no murmurs appreciated Pulmonary/Chest: Clear to auscultation bilaterally, no wheezes or rails Abdominal: Soft.  no distension.  no tenderness.  Musculoskeletal: Normal range of motion Neurological:  normal muscle tone. Coordination normal. No atrophy Skin: Skin warm and dry Psychiatric: normal affect, pleasant   Recent Labs: 10/15/2019: ALT 25; BUN 10; Creatinine, Ser 0.81; Hemoglobin 13.6; Platelets 264; Potassium 4.3; Sodium 140    Lipid Panel Lab Results  Component Value Date   CHOL 174 04/17/2019   HDL 55 04/17/2019   LDLCALC 101 (H) 04/17/2019   TRIG 102 04/17/2019      Wt Readings from Last 3 Encounters:  11/25/19 187 lb (84.8 kg)  11/12/19 190 lb (86.2 kg)  10/15/19 200 lb 9.9 oz (91 kg)      ASSESSMENT AND PLAN:  Coronary calcification With family history of cardiovascular disorder -  CT coronary calcium score Score of 26, with fatty liver -Weight trending down, watching her diet, exercising Cholesterol much improved from 270 down to 170 and has had weight loss since that time No changes made to the medications  Mixed hyperlipidemia Dramatic improvement in cholesterol we will continue Crestor and Zetia  NASH Seen on CT coronary calcium scoring last year Weight down 20 pounds     Total encounter time more than 25 minutes  Greater than 50% was spent in counseling and coordination of care with the patient    No orders of the defined  types were placed in this encounter.    Signed, Esmond Plants, M.D., Ph.D. 11/25/2019  [place], [place]

## 2019-11-25 ENCOUNTER — Other Ambulatory Visit: Payer: Self-pay

## 2019-11-25 ENCOUNTER — Ambulatory Visit (INDEPENDENT_AMBULATORY_CARE_PROVIDER_SITE_OTHER): Payer: 59 | Admitting: Cardiovascular Disease

## 2019-11-25 ENCOUNTER — Encounter: Payer: Self-pay | Admitting: Cardiovascular Disease

## 2019-11-25 VITALS — BP 120/60 | HR 98 | Ht 68.0 in | Wt 187.0 lb

## 2019-11-25 DIAGNOSIS — I2510 Atherosclerotic heart disease of native coronary artery without angina pectoris: Secondary | ICD-10-CM

## 2019-11-25 DIAGNOSIS — R000 Tachycardia, unspecified: Secondary | ICD-10-CM | POA: Diagnosis not present

## 2019-11-25 DIAGNOSIS — K7581 Nonalcoholic steatohepatitis (NASH): Secondary | ICD-10-CM | POA: Diagnosis not present

## 2019-11-25 DIAGNOSIS — E782 Mixed hyperlipidemia: Secondary | ICD-10-CM | POA: Diagnosis not present

## 2019-11-25 NOTE — Patient Instructions (Signed)
Medication Instructions:  No changes  If you need a refill on your cardiac medications before your next appointment, please call your pharmacy.    Lab work: No new labs needed   If you have labs (blood work) drawn today and your tests are completely normal, you will receive your results only by: Marland Kitchen MyChart Message (if you have MyChart) OR . A paper copy in the mail If you have any lab test that is abnormal or we need to change your treatment, we will call you to review the results.   Testing/Procedures: No new testing needed   Follow-Up: At [facility], you and your health needs are our priority.  As part of our continuing mission to provide you with exceptional heart care, we have created designated Provider Care Teams.  These Care Teams include your primary Cardiologist (physician) and Advanced Practice Providers (APPs -  Physician Assistants and Nurse Practitioners) who all work together to provide you with the care you need, when you need it.  . You will need a follow up appointment as needed  . Providers on your designated Care Team:   . Murray Hodgkins, NP . Christell Faith, PA-C . Marrianne Mood, PA-C  Any Other Special Instructions Will Be Listed Below (If Applicable).  COVID-19 Vaccine Information can be found at: ShippingScam.co.uk For questions related to vaccine distribution or appointments, please email vaccine@[email or web address] .[email or web address] or call [phone].

## 2019-12-28 ENCOUNTER — Ambulatory Visit (INDEPENDENT_AMBULATORY_CARE_PROVIDER_SITE_OTHER): Payer: 59 | Admitting: Gastroenterology

## 2019-12-28 VITALS — BP 146/82 | HR 92 | Temp 97.3°F | Ht 68.0 in | Wt 190.0 lb

## 2019-12-28 DIAGNOSIS — K760 Fatty (change of) liver, not elsewhere classified: Secondary | ICD-10-CM

## 2019-12-28 NOTE — Patient Instructions (Signed)
Fatty Liver Disease  Fatty liver disease occurs when too much fat has built up in your liver cells. Fatty liver disease is also called hepatic steatosis or steatohepatitis. The liver removes harmful substances from your bloodstream and produces fluids that your body needs. It also helps your body use and store energy from the food you eat. In many cases, fatty liver disease does not cause symptoms or problems. It is often diagnosed when tests are being done for other reasons. However, over time, fatty liver can cause inflammation that may lead to more serious liver problems, such as scarring of the liver (cirrhosis) and liver failure. Fatty liver is associated with insulin resistance, increased body fat, high blood pressure (hypertension), and high cholesterol. These are features of metabolic syndrome and increase your risk for stroke, diabetes, and heart disease. What are the causes? This condition may be caused by:  Drinking too much alcohol.  Poor nutrition.  Obesity.  Cushing's syndrome.  Diabetes.  High cholesterol.  Certain drugs.  Poisons.  Some viral infections.  Pregnancy. What increases the risk? You are more likely to develop this condition if you:  Abuse alcohol.  Are overweight.  Have diabetes.  Have hepatitis.  Have a high triglyceride level.  Are pregnant. What are the signs or symptoms? Fatty liver disease often does not cause symptoms. If symptoms do develop, they can include:  Fatigue.  Weakness.  Weight loss.  Confusion.  Abdominal pain.  Nausea and vomiting.  Yellowing of your skin and the white parts of your eyes (jaundice).  Itchy skin. How is this diagnosed? This condition may be diagnosed by:  A physical exam and medical history.  Blood tests.  Imaging tests, such as an ultrasound, CT scan, or MRI.  A liver biopsy. A small sample of liver tissue is removed using a needle. The sample is then looked at under a microscope. How  is this treated? Fatty liver disease is often caused by other health conditions. Treatment for fatty liver may involve medicines and lifestyle changes to manage conditions such as:  Alcoholism.  High cholesterol.  Diabetes.  Being overweight or obese. Follow these instructions at home:   Do not drink alcohol. If you have trouble quitting, ask your health care provider how to safely quit with the help of medicine or a supervised program. This is important to keep your condition from getting worse.  Eat a healthy diet as told by your health care provider. Ask your health care provider about working with a diet and nutrition specialist (dietitian) to develop an eating plan.  Exercise regularly. This can help you lose weight and control your cholesterol and diabetes. Talk to your health care provider about an exercise plan and which activities are best for you.  Take over-the-counter and prescription medicines only as told by your health care provider.  Keep all follow-up visits as told by your health care provider. This is important. Contact a health care provider if: You have trouble controlling your:  Blood sugar. This is especially important if you have diabetes.  Cholesterol.  Drinking of alcohol. Get help right away if:  You have abdominal pain.  You have jaundice.  You have nausea and vomiting.  You vomit blood or material that looks like coffee grounds.  You have stools that are black, tar-like, or bloody. Summary  Fatty liver disease develops when too much fat builds up in the cells of your liver.  Fatty liver disease often causes no symptoms or problems. However, over   time, fatty liver can cause inflammation that may lead to more serious liver problems, such as scarring of the liver (cirrhosis).  You are more likely to develop this condition if you abuse alcohol, are pregnant, are overweight, have diabetes, have hepatitis, or have high triglyceride  levels.  Contact your health care provider if you have trouble controlling your weight, blood sugar, cholesterol, or drinking of alcohol. This information is not intended to replace advice given to you by your health care provider. Make sure you discuss any questions you have with your health care provider. Document Revised: 12/07/2016 Document Reviewed: 10/03/2016 Elsevier Patient Education  2020 [employer]  

## 2019-12-28 NOTE — Progress Notes (Signed)
Jonathon Bellows MD, MRCP(U.K) [address]  [place]  [place], [place] [address]  Main: [phone]  Fax: [phone]   Gastroenterology Consultation  Referring Provider:     Burnard Hawthorne, FNP Primary Care Physician:  Burnard Hawthorne, FNP Primary Gastroenterologist:  Dr. Jonathon Bellows  Reason for Consultation:     Fatty liver        HPI:   Teresa Franco is a 59 y.o. y/o female referred for consultation & management  By  Burnard Hawthorne, FNP.     She underwent a right upper quadrant ultrasound on 10/29/2019 for elevated LFTs.  Fatty liver was noted otherwise was unremarkable.  She had labs in October 2021 which showed no abnormalities in her LFTs.  About a year back she had mild elevation in her AST and ALT.  But that has since resolved.Hemoglobin 13.6 g in October 2021.Her screening colonoscopy was in November 2021 which was normal and repeat suggested in 10 years.  She has a diagnosis of prediabetes with HbA1c of 6.0 hyperlipidemia.  CT calcium score of 26. She has no complaints.  She is trying to lose weight and eat healthy. Past Medical History:  Diagnosis Date  . Breast cancer ([place]) 2004   chemo/ radiation; Her2/neu +  . Dysrhythmia    palpitations  . Hyperlipidemia   . LGSIL on Pap smear of cervix   . Personal history of chemotherapy   . Personal history of radiation therapy   . Prediabetes   . Seasonal allergies   . UTI (urinary tract infection)     Past Surgical History:  Procedure Laterality Date  . BREAST BIOPSY Right 07/2002   positive  . BREAST BIOPSY Right 2010   negative  . BREAST LUMPECTOMY Right 08/09/2002   positive for breast cancer  . BROW LIFT Bilateral 06/25/2017   Procedure: BLEPHAROPLASTY UPPER EYELID WITH EXCESS SKIN;  Surgeon: Karle Starch, MD;  Location: [facility];  Service: Ophthalmology;  Laterality: Bilateral;  . COLONOSCOPY  03/31/2010  . COLONOSCOPY WITH PROPOFOL N/A 11/12/2019   Procedure: COLONOSCOPY WITH  PROPOFOL;  Surgeon: Jonathon Bellows, MD;  Location: [facility];  Service: Gastroenterology;  Laterality: N/A;  . LEEP  04/10/1993  . OTHER SURGICAL HISTORY  06/2017   eyelid, took off excessive skin    Prior to Admission medications   Medication Sig Start Date End Date Taking? Authorizing Provider  Ascorbic Acid (VITAMIN C) 1000 MG tablet Take 1,000 mg by mouth daily.    [provider]  BIOTIN PO Take by mouth daily.     [provider]  Cholecalciferol (VITAMIN D3 PO) Take by mouth daily.     [provider]  ezetimibe (ZETIA) 10 MG tablet Take 1 tablet (10 mg total) by mouth daily. 04/20/19 11/25/19  Minna Merritts, MD  MEGARED OMEGA-3 KRILL OIL PO Take by mouth daily.     [provider]  Milk Thistle 175 MG CAPS Take by mouth daily.    [provider]  rosuvastatin (CRESTOR) 10 MG tablet Take 1 tablet (10 mg total) by mouth daily. 04/20/19   Minna Merritts, MD  VIT B12-METHIONINE-INOS-CHOL IM daily.     [provider]    Family History  Problem Relation Age of Onset  . Hyperlipidemia Mother   . Heart disease Mother   . Thyroid disease Mother   . Hyperlipidemia Father   . Heart disease Father 81       MI- died  at 32 ? SCD  . Hypertension Father   . Breast cancer Maternal Aunt 23  . Breast cancer Maternal Aunt 60  . Colon cancer Neg Hx   . Ovarian cancer Neg Hx   . Melanoma Neg Hx      Social History   Tobacco Use  . Smoking status: Former Smoker    Quit date: 01/08/1997    Years since quitting: 22.9  . Smokeless tobacco: Never Used  Vaping Use  . Vaping Use: Never used  Substance Use Topics  . Alcohol use: Yes    Alcohol/week: 0.0 standard drinks  . Drug use: No    Allergies as of 12/28/2019 - Review Complete 12/28/2019  Allergen Reaction Noted  . Sulfa antibiotics Rash 05/02/2014    Review of Systems:    All systems reviewed and negative except where noted in HPI.   Physical Exam:  BP (!) 146/82    Pulse 92   Temp (!) 97.3 F (36.3 C)   Ht 5' 8"  (1.727 m)   Wt 190 lb (86.2 kg)   BMI 28.89 kg/m  No LMP recorded. Patient is postmenopausal. Psych:  Alert and cooperative. Normal mood and affect. General:   Alert,  Well-developed, well-nourished, pleasant and cooperative in NAD Head:  Normocephalic and atraumatic. Eyes:  Sclera clear, no icterus.   Conjunctiva pink. Ears:  Normal auditory acuity. Lungs:  Respirations even and unlabored.  Clear throughout to auscultation.   No wheezes, crackles, or rhonchi. No acute distress. Heart:  Regular rate and rhythm; no murmurs, clicks, rubs, or gallops. Abdomen:  Normal bowel sounds.  No bruits.  Soft, non-tender and non-distended without masses, hepatosplenomegaly or hernias noted.  No guarding or rebound tenderness.    Neurologic:  Alert and oriented x3;  grossly normal neurologically. Psych:  Alert and cooperative. Normal mood and affect.  Imaging Studies: No results found.  Assessment and Plan:   Teresa Franco is a 59 y.o. y/o female has been referred for fatty liver.  History of prediabetes, hyperlipidemia .  No biochemical or radiological evidence of cirrhosis.  Mainstay of therapy is lifestyle changes and addressing cardiovascular risk factors for fatty liver disease.  Plan 1.  Check hepatitis A, B vaccination status if not immune will need immunization 2.  Weight loss, Mediterranean diet, exercise and aggressive monitoring of blood pressure, blood glucose. 3.  Patient information on fatty liver disease to be provided 4.  In 1 years time I will perform a fibrosure and a FibroScan.  Presently has good liver function  Follow up in 12 months  Dr Jonathon Bellows MD,MRCP(U.K)

## 2019-12-29 ENCOUNTER — Telehealth: Payer: Self-pay

## 2019-12-29 LAB — HEPATITIS A ANTIBODY, TOTAL: hep A Total Ab: POSITIVE — AB

## 2019-12-29 LAB — HEPATITIS B SURFACE ANTIBODY,QUALITATIVE: Hep B Surface Ab, Qual: NONREACTIVE

## 2019-12-29 NOTE — Telephone Encounter (Signed)
-----   Message from Jonathon Bellows, MD sent at 12/29/2019  9:17 AM EST ----- Sherald Hess inform immune to hep A but not to Hep B and hence needs Hep B shot

## 2020-01-19 ENCOUNTER — Other Ambulatory Visit: Payer: Self-pay

## 2020-01-19 ENCOUNTER — Ambulatory Visit: Payer: 59 | Admitting: Family

## 2020-01-19 ENCOUNTER — Encounter: Payer: Self-pay | Admitting: Family

## 2020-01-19 ENCOUNTER — Ambulatory Visit: Payer: 59

## 2020-01-19 VITALS — BP 112/74 | HR 107 | Temp 97.9°F | Ht 67.75 in | Wt 189.2 lb

## 2020-01-19 DIAGNOSIS — K7581 Nonalcoholic steatohepatitis (NASH): Secondary | ICD-10-CM

## 2020-01-19 DIAGNOSIS — E119 Type 2 diabetes mellitus without complications: Secondary | ICD-10-CM

## 2020-01-19 DIAGNOSIS — Z23 Encounter for immunization: Secondary | ICD-10-CM | POA: Diagnosis not present

## 2020-01-19 DIAGNOSIS — E782 Mixed hyperlipidemia: Secondary | ICD-10-CM | POA: Diagnosis not present

## 2020-01-19 LAB — COMPREHENSIVE METABOLIC PANEL WITH GFR
ALT: 17 U/L (ref 0–35)
AST: 15 U/L (ref 0–37)
Albumin: 4.6 g/dL (ref 3.5–5.2)
Alkaline Phosphatase: 64 U/L (ref 39–117)
BUN: 15 mg/dL (ref 6–23)
CO2: 28 meq/L (ref 19–32)
Calcium: 10 mg/dL (ref 8.4–10.5)
Chloride: 104 meq/L (ref 96–112)
Creatinine, Ser: 0.83 mg/dL (ref 0.40–1.20)
GFR: 77.78 mL/min (ref >60.00)
Glucose, Bld: 115 mg/dL — ABNORMAL HIGH (ref 70–99)
Potassium: 4.4 meq/L (ref 3.5–5.1)
Sodium: 139 meq/L (ref 135–145)
Total Bilirubin: 0.5 mg/dL (ref 0.2–1.2)
Total Protein: 7.6 g/dL (ref 6.0–8.3)

## 2020-01-19 LAB — LIPID PANEL
Cholesterol: 106 mg/dL (ref 0–200)
HDL: 47 mg/dL (ref >39.00)
LDL Cholesterol: 44 mg/dL (ref 0–99)
NonHDL: 59.14
Total CHOL/HDL Ratio: 2
Triglycerides: 75 mg/dL (ref 0.0–149.0)
VLDL: 15 mg/dL (ref 0.0–40.0)

## 2020-01-19 LAB — VITAMIN D 25 HYDROXY (VIT D DEFICIENCY, FRACTURES): VITD: 79.41 ng/mL (ref 30.00–100.00)

## 2020-01-19 LAB — TSH: TSH: 1.61 u[IU]/mL (ref 0.35–4.50)

## 2020-01-19 LAB — HEMOGLOBIN A1C: Hgb A1c MFr Bld: 5.8 % (ref 4.6–6.5)

## 2020-01-19 NOTE — Assessment & Plan Note (Signed)
Congratulated patient on lifestyle changes. She will follow up with Dr Vicente Males in one year for fibroscan

## 2020-01-19 NOTE — Patient Instructions (Signed)
Nice to see you!   

## 2020-01-19 NOTE — Addendum Note (Signed)
Addended by: Earlyne Iba on: 01/19/2020 08:47 AM   Modules accepted: Orders

## 2020-01-19 NOTE — Progress Notes (Signed)
Subjective:    Patient ID: Teresa Franco, female    DOB: February 20, 1960, 60 y.o.   MRN: [id number]  CC: Teresa Franco is a 60 y.o. female who presents today for follow up.   HPI: Feels well today No complaints  Has worked very had after diagnosis of DM to eat healthier, and regularly exercise. She has lost 18 lbs.   HLD- follows with Teresa Teresa Franco. Compliant with zetia 108m and crestor 14m    Consult with Teresa Franco month, advised weight loss, mediterranean diet in setting of NAFLD. Advised in 1 year fibrosure and a Fibroscan Requires hep B vaccine.        HISTORY:  Past Medical History:  Diagnosis Date  . [place]   [place]/ radiation; Her2/neu +  . Dysrhythmia    palpitations  . Hyperlipidemia   . LGSIL on Pap smear of cervix   . Personal history of chemotherapy   . Personal history of radiation therapy   . Prediabetes   . Seasonal allergies   . UTI (urinary tract infection)    Past Surgical History:  Procedure Laterality Date  . BREAST BIOPSY Right 07/2002   positive  . BREAST BIOPSY Right 2010   negative  . BREAST LUMPECTOMY Right 08/09/2002   positive for breast cancer  . BROW LIFT Bilateral 06/25/2017   Procedure: BLEPHAROPLASTY UPPER EYELID WITH EXCESS SKIN;  Surgeon: FoKarle StarchMD;  [facility] Service: Ophthal[facility];  Laterality: Bilateral;  . COLONOSCOPY  03/31/2010  . COLONOSCOPY WITH PROPOFOL N/A 11/12/2019   Procedure: COLONOSCOPY WITH PROPOFOL;  Surgeon: AnJonathon BellowsMD;  [facility];  Service: Gastroenterology;  Laterality: N/A;  . LEEP  04/10/1993  . OTHER SURGICAL HISTORY  06/2017   eyelid, took off excessive skin   Family History  Problem Relation Age of Onset  . Hyperlipidemia Mother   . Heart disease Mother   . Thyroid disease Mother   . Hyperlipidemia Father   . Heart disease Father 2576     MI- died at 2560 SCD  . Hypertension Father   . Breast cancer Maternal Aunt 5056. Breast  cancer Maternal Aunt 60  . Colon cancer Neg Hx   . Ovarian cancer Neg Hx   . Melanoma Neg Hx     Allergies: Sulfa antibiotics Current Outpatient Medications on File Prior to Visit  Medication Sig Dispense Refill  . Ascorbic Acid (VITAMIN C) 1000 MG tablet Take 1,000 mg by mouth daily.    . Marland KitchenIOTIN PO Take by mouth daily.     . Cholecalciferol (VITAMIN D3 PO) Take by mouth daily.     . Marland KitchenEGARED OMEGA-3 KRILL OIL PO Take by mouth daily.     . Milk Thistle 175 MG CAPS Take by mouth daily.    . rosuvastatin (CRESTOR) 10 MG tablet Take 1 tablet (10 mg total) by mouth daily. 90 tablet 3  . VIT B12-METHIONINE-INOS-CHOL IM daily.     . Marland Kitchenzetimibe (ZETIA) 10 MG tablet Take 1 tablet (10 mg total) by mouth daily. 90 tablet 3   No current facility-administered medications on file prior to visit.    Social History   Tobacco Use  . Smoking status: Former Smoker    Quit date: 01/08/1997    Years since quitting: 23.0  . Smokeless tobacco: Never Used  Vaping Use  . Vaping Use: Never used  Substance Use Topics  . Alcohol use: Yes  Alcohol/week: 0.0 standard drinks  . Drug use: No    Review of Systems  Constitutional: Negative for chills and fever.  Respiratory: Negative for cough.   Cardiovascular: Negative for chest pain and palpitations.  Gastrointestinal: Negative for nausea and vomiting.  Musculoskeletal: Negative for arthralgias.  Neurological: Negative for numbness.      Objective:    BP 112/74   Pulse (!) 107   Temp 97.9 F (36.6 C)   Ht 5' 7.75" (1.721 m)   Wt 189 lb 3.2 oz (85.8 kg)   SpO2 98%   BMI 28.98 kg/m  BP Readings from Last 3 Encounters:  01/19/20 112/74  12/28/19 (!) 146/82  11/25/19 120/60   Wt Readings from Last 3 Encounters:  01/19/20 189 lb 3.2 oz (85.8 kg)  12/28/19 190 lb (86.2 kg)  11/25/19 187 lb (84.8 kg)    Physical Exam Vitals reviewed.  Constitutional:      Appearance: She is well-developed and well-nourished.  Eyes:      Conjunctiva/sclera: Conjunctivae normal.  Cardiovascular:     Rate and Rhythm: Normal rate and regular rhythm.     Pulses: Normal pulses.     Heart sounds: Normal heart sounds.  Pulmonary:     Effort: Pulmonary effort is normal.     Breath sounds: Normal breath sounds. No wheezing, rhonchi or rales.  Skin:    General: Skin is warm and dry.  Neurological:     Mental Status: She is alert.  Psychiatric:        Mood and Affect: Mood and affect normal.        Speech: Speech normal.        Behavior: Behavior normal.        Thought Content: Thought content normal.        Assessment & Plan:   Problem List Items Addressed This Visit      Digestive   NASH (nonalcoholic steatohepatitis)    Congratulated patient on lifestyle changes. She will follow up with Teresa Vicente Franco in one year for fibroscan        Endocrine   Diabetes mellitus without complication ([place]) - Primary    Anticipate improved. Pending a1c.       Relevant Orders   TSH   Comprehensive metabolic panel   Hemoglobin A1c   Lipid panel   VITAMIN D 25 Hydroxy (Vit-D Deficiency, Fractures)     Other   HLD (hyperlipidemia)    Anticipate improved. Pending lipid panel. Continue  zetia 78m and crestor 168m             I am having Teresa Franco maintain her BIOTIN PO, MEGARED OMEGA-3 KRILL OIL PO, Cholecalciferol (VITAMIN D3 PO), VIT B12-METHIONINE-INOS-CHOL IM, Milk Thistle, vitamin C, rosuvastatin, and ezetimibe.   No orders of the defined types were placed in this encounter.   Return precautions given.   Risks, benefits, and alternatives of the medications and treatment plan prescribed today were discussed, and patient expressed understanding.   Education regarding symptom management and diagnosis given to patient on AVS.  Continue to follow with Teresa HawthorneFNP for routine health maintenance.   AnLennox Grumblesnd I agreed with plan.   MaMable ParisFNP

## 2020-01-19 NOTE — Assessment & Plan Note (Signed)
Anticipate improved. Pending a1c.

## 2020-01-19 NOTE — Assessment & Plan Note (Signed)
Anticipate improved. Pending lipid panel. Continue  zetia 42m and crestor 120m

## 2020-01-20 ENCOUNTER — Encounter: Payer: Self-pay | Admitting: Family

## 2020-02-19 ENCOUNTER — Other Ambulatory Visit: Payer: Self-pay

## 2020-02-19 ENCOUNTER — Ambulatory Visit (INDEPENDENT_AMBULATORY_CARE_PROVIDER_SITE_OTHER): Payer: 59 | Admitting: *Deleted

## 2020-02-19 DIAGNOSIS — Z23 Encounter for immunization: Secondary | ICD-10-CM | POA: Diagnosis not present

## 2020-04-13 ENCOUNTER — Ambulatory Visit: Payer: 59 | Admitting: Family

## 2020-04-14 ENCOUNTER — Other Ambulatory Visit: Payer: Self-pay | Admitting: Cardiovascular Disease

## 2020-04-19 ENCOUNTER — Telehealth: Payer: Self-pay

## 2020-04-19 ENCOUNTER — Other Ambulatory Visit: Payer: Self-pay | Admitting: Cardiovascular Disease

## 2020-04-19 ENCOUNTER — Encounter: Payer: Self-pay | Admitting: Family

## 2020-04-19 NOTE — Telephone Encounter (Signed)
Patient had requested a refill on Cresto 10 mg. I suggested she make a follow up appointment with Dr. Rockey Situ at least once a year in order to continue to refill her medications. The patient will contact her primary care physician being she just saw her PCP for a physical. The patient will contact our office if she has any further concerns regarding her medication refils.

## 2020-04-21 MED ORDER — ROSUVASTATIN CALCIUM 10 MG PO TABS: 10.0000 mg | ORAL_TABLET | Freq: Every day | ORAL | 3 refills | Status: DC

## 2020-06-08 ENCOUNTER — Ambulatory Visit: Payer: 59 | Admitting: Family

## 2020-06-08 ENCOUNTER — Encounter: Payer: Self-pay | Admitting: Family

## 2020-06-08 ENCOUNTER — Other Ambulatory Visit: Payer: Self-pay

## 2020-06-08 VITALS — BP 110/72 | HR 107 | Temp 97.9°F | Ht 67.75 in | Wt 185.8 lb

## 2020-06-08 DIAGNOSIS — G479 Sleep disorder, unspecified: Secondary | ICD-10-CM | POA: Diagnosis not present

## 2020-06-08 DIAGNOSIS — R7303 Prediabetes: Secondary | ICD-10-CM | POA: Diagnosis not present

## 2020-06-08 DIAGNOSIS — E119 Type 2 diabetes mellitus without complications: Secondary | ICD-10-CM | POA: Diagnosis not present

## 2020-06-08 LAB — POCT GLYCOSYLATED HEMOGLOBIN (HGB A1C): Hemoglobin A1C: 5.7 % — AB (ref 4.0–5.6)

## 2020-06-08 NOTE — Assessment & Plan Note (Signed)
Lab Results  Component Value Date   HGBA1C 5.7 (A) 06/08/2020   Excellent improvement. Continue lifestyle modifications.

## 2020-06-08 NOTE — Progress Notes (Signed)
Subjective:    Patient ID: Teresa Franco, female    DOB: [birth year], 60 y.o.   MRN: [id number]  CC: Teresa Franco is a 60 y.o. female who presents today for follow up.   HPI: Feels well today Working on diet and exercise  Complains of trouble falling and staying asleep the last 6 weeks. A lot on mind with daughter moving. She endorses looking at her phone at night and she thinks contributory  No anxiety or depression.    a1c 5.7  HLD- compliant with zetia 87m, crestor 134mMammogram scheduled  HISTORY:  Past Medical History:  Diagnosis Date  . [place]   [place]/ radiation; Her2/neu +  . Dysrhythmia    palpitations  . Hyperlipidemia   . LGSIL on Pap smear of cervix   . Personal history of chemotherapy   . Personal history of radiation therapy   . Prediabetes   . Seasonal allergies   . UTI (urinary tract infection)    Past Surgical History:  Procedure Laterality Date  . BREAST BIOPSY Right 07/2002   positive  . BREAST BIOPSY Right 2010   negative  . BREAST LUMPECTOMY Right 08/09/2002   positive for breast cancer  . BROW LIFT Bilateral 06/25/2017   Procedure: BLEPHAROPLASTY UPPER EYELID WITH EXCESS SKIN;  Surgeon: FoKarle StarchMD;  [facility];  Laterality: Bilateral;  . COLONOSCOPY  03/31/2010  . COLONOSCOPY WITH PROPOFOL N/A 11/12/2019   Procedure: COLONOSCOPY WITH PROPOFOL;  Surgeon: Teresa BellowsMD;  [facility];  Service: Gastroenterology;  Laterality: N/A;  . LEEP  04/10/1993  . OTHER SURGICAL HISTORY  06/2017   eyelid, took off excessive skin   Family History  Problem Relation Age of Onset  . Hyperlipidemia Mother   . Heart disease Mother   . Thyroid disease Mother   . Hyperlipidemia Father   . Heart disease Father 2540     MI- died at 255 SCD  . Hypertension Father   . Breast cancer Maternal Aunt 5021. Breast cancer Maternal Aunt 60  . Colon cancer Neg Hx   . Ovarian  cancer Neg Hx   . Melanoma Neg Hx     Allergies: Sulfa antibiotics Current Outpatient Medications on File Prior to Visit  Medication Sig Dispense Refill  . Ascorbic Acid (VITAMIN C) 1000 MG tablet Take 1,000 mg by mouth daily.    . Marland KitchenIOTIN PO Take by mouth daily.     . Cholecalciferol (VITAMIN D3 PO) Take 1 capsule by mouth daily. Take 1000 Iu by  mouth daily.    . Marland Kitchenzetimibe (ZETIA) 10 MG tablet TAKE 1 TABLET BY MOUTH EVERY DAY 30 tablet 10  . MEGARED OMEGA-3 KRILL OIL PO Take by mouth daily.     . Milk Thistle 175 MG CAPS Take by mouth daily.    . rosuvastatin (CRESTOR) 10 MG tablet Take 1 tablet (10 mg total) by mouth daily. 90 tablet 3  . VIT B12-METHIONINE-INOS-CHOL IM daily.      No current facility-administered medications on file prior to visit.    Social History   Tobacco Use  . Smoking status: Former Smoker    Quit date: 01/08/1997    Years since quitting: 23.4  . Smokeless tobacco: Never Used  Vaping Use  . Vaping Use: Never used  Substance Use Topics  . Alcohol use: Yes    Alcohol/week: 0.0 standard drinks  . Drug use: No  Review of Systems  Constitutional: Negative for chills and fever.  Respiratory: Negative for cough.   Cardiovascular: Negative for chest pain and palpitations.  Gastrointestinal: Negative for nausea and vomiting.  Psychiatric/Behavioral: Positive for sleep disturbance. Negative for suicidal ideas.      Objective:    BP 110/72 (BP Location: Left Arm, Patient Position: Sitting, Cuff Size: Large)   Pulse (!) 107   Temp 97.9 F (36.6 C) (Oral)   Ht 5' 7.75" (1.721 m)   Wt 185 lb 12.8 oz (84.3 kg)   SpO2 98%   BMI 28.46 kg/m  BP Readings from Last 3 Encounters:  06/08/20 110/72  01/19/20 112/74  12/28/19 (!) 146/82   Wt Readings from Last 3 Encounters:  06/08/20 185 lb 12.8 oz (84.3 kg)  01/19/20 189 lb 3.2 oz (85.8 kg)  12/28/19 190 lb (86.2 kg)    Physical Exam Vitals reviewed.  Constitutional:      Appearance: She is  well-developed.  Eyes:     Conjunctiva/sclera: Conjunctivae normal.  Cardiovascular:     Rate and Rhythm: Normal rate and regular rhythm.     Pulses: Normal pulses.     Heart sounds: Normal heart sounds.  Pulmonary:     Effort: Pulmonary effort is normal.     Breath sounds: Normal breath sounds. No wheezing, rhonchi or rales.  Skin:    General: Skin is warm and dry.  Neurological:     Mental Status: She is alert.  Psychiatric:        Speech: Speech normal.        Behavior: Behavior normal.        Thought Content: Thought content normal.        Assessment & Plan:   Problem List Items Addressed This Visit      Other   Prediabetes    Lab Results  Component Value Date   HGBA1C 5.7 (A) 06/08/2020   Excellent improvement. Continue lifestyle modifications.       Trouble in sleeping    New. Discussed trazodone, OTC melatonin. Patient would like to focus on sleep hygiene at home and will let me know if she would like to trial trazodone via mychart       Other Visit Diagnoses    Diabetes mellitus without complication ([place])    -  Primary   Relevant Orders   POCT HgB A1C (Completed)       I am having Teresa Franco maintain her BIOTIN PO, MEGARED OMEGA-3 KRILL OIL PO, Cholecalciferol (VITAMIN D3 PO), VIT B12-METHIONINE-INOS-CHOL IM, Milk Thistle, vitamin C, ezetimibe, and rosuvastatin.   No orders of the defined types were placed in this encounter.   Return precautions given.   Risks, benefits, and alternatives of the medications and treatment plan prescribed today were discussed, and patient expressed understanding.   Education regarding symptom management and diagnosis given to patient on AVS.  Continue to follow with Teresa Hawthorne, FNP for routine health maintenance.   Teresa Franco and I agreed with plan.   Teresa Paris, FNP

## 2020-06-08 NOTE — Patient Instructions (Addendum)
   Essentials for good sleep:   #1 Exercise #2 Limit Caffeine ( no caffeine after lunch) #3 No smart phones, TV prior to bed -- BLUE light is VERY activating and send the brain an 'awake message.'  #4 Go to bed at same time of night each night and get up at same time of day.  #5 Take 0.5 to 35m melatonin at 7pm with dinner -this is when natural melatonin will start to increase #6 May try over the counter Unisom for sleep aid

## 2020-06-08 NOTE — Assessment & Plan Note (Signed)
New. Discussed trazodone, OTC melatonin. Patient would like to focus on sleep hygiene at home and will let me know if she would like to trial trazodone via mychart

## 2020-07-07 ENCOUNTER — Encounter: Payer: Self-pay | Admitting: Family

## 2020-07-08 ENCOUNTER — Other Ambulatory Visit: Payer: Self-pay | Admitting: Family

## 2020-07-08 DIAGNOSIS — L989 Disorder of the skin and subcutaneous tissue, unspecified: Secondary | ICD-10-CM

## 2020-10-13 ENCOUNTER — Ambulatory Visit: Payer: 59 | Admitting: Hematology and Oncology

## 2020-10-13 ENCOUNTER — Other Ambulatory Visit: Payer: Self-pay

## 2020-10-13 ENCOUNTER — Ambulatory Visit
Admission: RE | Admit: 2020-10-13 | Discharge: 2020-10-13 | Disposition: A | Discharge status: Home / Self Care | Payer: 59 | Source: Ambulatory Visit | Attending: Hematology and Oncology | Admitting: Hematology and Oncology

## 2020-10-13 ENCOUNTER — Other Ambulatory Visit: Payer: 59

## 2020-10-13 DIAGNOSIS — Z1231 Encounter for screening mammogram for malignant neoplasm of breast: Secondary | ICD-10-CM | POA: Insufficient documentation

## 2020-10-13 DIAGNOSIS — Z170 Estrogen receptor positive status [ER+]: Secondary | ICD-10-CM | POA: Diagnosis present

## 2020-10-13 DIAGNOSIS — C50911 Malignant neoplasm of unspecified site of right female breast: Secondary | ICD-10-CM | POA: Diagnosis present

## 2020-10-17 ENCOUNTER — Inpatient Hospital Stay: Payer: 59 | Attending: Internal Medicine

## 2020-10-17 ENCOUNTER — Other Ambulatory Visit: Payer: Self-pay

## 2020-10-17 ENCOUNTER — Encounter: Payer: Self-pay | Admitting: Internal Medicine

## 2020-10-17 ENCOUNTER — Inpatient Hospital Stay (HOSPITAL_BASED_OUTPATIENT_CLINIC_OR_DEPARTMENT_OTHER): Payer: 59 | Admitting: Internal Medicine

## 2020-10-17 VITALS — BP 124/90 | HR 107 | Temp 99.2°F | Resp 18 | Wt 194.0 lb

## 2020-10-17 DIAGNOSIS — C50811 Malignant neoplasm of overlapping sites of right female breast: Secondary | ICD-10-CM | POA: Diagnosis not present

## 2020-10-17 DIAGNOSIS — Z87891 Personal history of nicotine dependence: Secondary | ICD-10-CM | POA: Insufficient documentation

## 2020-10-17 DIAGNOSIS — Z79899 Other long term (current) drug therapy: Secondary | ICD-10-CM | POA: Insufficient documentation

## 2020-10-17 DIAGNOSIS — Z13820 Encounter for screening for osteoporosis: Secondary | ICD-10-CM | POA: Diagnosis not present

## 2020-10-17 DIAGNOSIS — Z923 Personal history of irradiation: Secondary | ICD-10-CM | POA: Diagnosis not present

## 2020-10-17 DIAGNOSIS — Z170 Estrogen receptor positive status [ER+]: Secondary | ICD-10-CM | POA: Diagnosis not present

## 2020-10-17 DIAGNOSIS — C50911 Malignant neoplasm of unspecified site of right female breast: Secondary | ICD-10-CM

## 2020-10-17 DIAGNOSIS — E785 Hyperlipidemia, unspecified: Secondary | ICD-10-CM | POA: Insufficient documentation

## 2020-10-17 LAB — CBC WITH DIFFERENTIAL/PLATELET
Abs Immature Granulocytes: 0.02 10*3/uL (ref 0.00–0.07)
Basophils Absolute: 0.1 10*3/uL (ref 0.0–0.1)
Basophils Relative: 1 %
Eosinophils Absolute: 0.2 10*3/uL (ref 0.0–0.5)
Eosinophils Relative: 4 %
HCT: 43.8 % (ref 36.0–46.0)
Hemoglobin: 13.9 g/dL (ref 12.0–15.0)
Immature Granulocytes: 0 %
Lymphocytes Relative: 31 %
Lymphs Abs: 1.8 10*3/uL (ref 0.7–4.0)
MCH: 29.1 pg (ref 26.0–34.0)
MCHC: 31.7 g/dL (ref 30.0–36.0)
MCV: 91.6 fL (ref 80.0–100.0)
Monocytes Absolute: 0.4 10*3/uL (ref 0.1–1.0)
Monocytes Relative: 6 %
Neutro Abs: 3.5 10*3/uL (ref 1.7–7.7)
Neutrophils Relative %: 58 %
Platelets: 228 10*3/uL (ref 150–400)
RBC: 4.78 MIL/uL (ref 3.87–5.11)
RDW: 14.4 % (ref 11.5–15.5)
WBC: 6 10*3/uL (ref 4.0–10.5)
nRBC: 0 % (ref 0.0–0.2)

## 2020-10-17 LAB — COMPREHENSIVE METABOLIC PANEL
ALT: 19 U/L (ref 0–44)
AST: 20 U/L (ref 15–41)
Albumin: 4.1 g/dL (ref 3.5–5.0)
Alkaline Phosphatase: 58 U/L (ref 38–126)
Anion gap: 7 (ref 5–15)
BUN: 14 mg/dL (ref 6–20)
CO2: 30 mmol/L (ref 22–32)
Calcium: 9.1 mg/dL (ref 8.9–10.3)
Chloride: 100 mmol/L (ref 98–111)
Creatinine, Ser: 0.86 mg/dL (ref 0.44–1.00)
GFR, Estimated: >60 mL/min (ref >60)
Glucose, Bld: 118 mg/dL — ABNORMAL HIGH (ref 70–99)
Potassium: 3.9 mmol/L (ref 3.5–5.1)
Sodium: 137 mmol/L (ref 135–145)
Total Bilirubin: 0.6 mg/dL (ref 0.3–1.2)
Total Protein: 8.1 g/dL (ref 6.5–8.1)

## 2020-10-17 NOTE — Progress Notes (Signed)
Patient here for oncology follow-up appointment, no new concerns

## 2020-10-17 NOTE — Progress Notes (Signed)
[facility] OFFICE PROGRESS NOTE  Patient Care Team: Burnard Hawthorne, FNP as PCP - General (Family Medicine)  Cancer Staging No matching staging information was found for the patient.   Oncology History Overview Note  Her2/neu + right breast cancer in 07/2012.  No pathology is available. Lymph nodes were positive.  Tumor was ER/PR positive and HER-2/neu positive.    She received neoadjuvant AC followed by Taxol and Herceptin.  Lumpectomy revealed a complete pathologic response. She received radiation in 03/2003.    She received tamoxifen from 02/2003 - 02/2008. She received her Herceptin from 05/2003 - 05/2004.  She received extended adjuvant hormonal therapy with Lupron and Femara from 03/2008 - 04/2013.  She tolerated her treatment well.   Bilateral screening mammogram on 10/06/2018 revealed no evidence of malignancy.  Bilateral screening mammogram on 10/13/2019 revealed no evidence of malignancy.   CA27.29 has been followed: 15.3 on 08/14/2011, 16.2 on 02/14/2012, 16.0 on 04/14/2013, 9.2 on 11/23/2013, 14.3 on 09/22/2014, 13.3 on 10/04/2016, 9.4 on 10/03/2017, 11.5 on 10/09/2018, 8.2 on 10/30/2018, and 6.9 on 10/15/2019.   Bone density on 09/20/2011 was normal with a T-score of -0.7 of the right femoral neck.    r. 5.   Bell's palsy, resolving               Carcinoma of overlapping sites of right breast in female, estrogen receptor positive ([place])  10/17/2020 Initial Diagnosis   Carcinoma of overlapping sites of right breast in female, estrogen receptor positive ([place])    INTERVAL HISTORY: Alone.  Ambulating independently.  Teresa Franco 60 y.o.  female pleasant patient above history of triple positive breast cancer status post neoadjuvant therapy-complete pathologic response is here for follow-up.  Patient denies any new lumps or bumps.  Appetite is good no weight loss.  No bone pain pain nausea vomiting.  No chest pain or shortness of breath or cough.  No  headaches.  Review of Systems  Constitutional:  Negative for chills, diaphoresis, fever, malaise/fatigue and weight loss.  HENT:  Negative for nosebleeds and sore throat.   Eyes:  Negative for double vision.  Respiratory:  Negative for cough, hemoptysis, sputum production, shortness of breath and wheezing.   Cardiovascular:  Negative for chest pain, palpitations, orthopnea and leg swelling.  Gastrointestinal:  Negative for abdominal pain, blood in stool, constipation, diarrhea, heartburn, melena, nausea and vomiting.  Genitourinary:  Negative for dysuria, frequency and urgency.  Musculoskeletal:  Negative for back pain and joint pain.  Skin: Negative.  Negative for itching and rash.  Neurological:  Negative for dizziness, tingling, focal weakness, weakness and headaches.  Endo/Heme/Allergies:  Does not bruise/bleed easily.  Psychiatric/Behavioral:  Negative for depression. The patient is not nervous/anxious and does not have insomnia.      PAST MEDICAL HISTORY :  Past Medical History:  Diagnosis Date   Breast cancer ([place]) 2004   chemo/ radiation; Her2/neu +   Dysrhythmia    palpitations   Hyperlipidemia    LGSIL on Pap smear of cervix    Personal history of chemotherapy    Personal history of radiation therapy    Prediabetes    Seasonal allergies    UTI (urinary tract infection)     PAST SURGICAL HISTORY :   Past Surgical History:  Procedure Laterality Date   BREAST BIOPSY Right 07/2002   positive   BREAST BIOPSY Right 2010   negative   BREAST LUMPECTOMY Right 08/09/2002   positive for breast cancer  BROW LIFT Bilateral 06/25/2017   Procedure: BLEPHAROPLASTY UPPER EYELID WITH EXCESS SKIN;  Surgeon: Karle Starch, MD;  Location: [facility];  Service: Ophthalmology;  Laterality: Bilateral;   COLONOSCOPY  03/31/2010   COLONOSCOPY WITH PROPOFOL N/A 11/12/2019   Procedure: COLONOSCOPY WITH PROPOFOL;  Surgeon: Jonathon Bellows, MD;  [facility] ENDOSCOPY;  Service:  Gastroenterology;  Laterality: N/A;   LEEP  04/10/1993   OTHER SURGICAL HISTORY  06/2017   eyelid, took off excessive skin    FAMILY HISTORY :   Family History  Problem Relation Age of Onset   Hyperlipidemia Mother    Heart disease Mother    Thyroid disease Mother    Hyperlipidemia Father    Heart disease Father 4       MI- died at 7 ? SCD   Hypertension Father    Breast cancer Maternal Aunt 38   Breast cancer Maternal Aunt 60   Colon cancer Neg Hx    Ovarian cancer Neg Hx    Melanoma Neg Hx     SOCIAL HISTORY:   Social History   Tobacco Use   Smoking status: Former    Types: Cigarettes    Quit date: 01/08/1997    Years since quitting: 23.7   Smokeless tobacco: Never  Vaping Use   Vaping Use: Never used  Substance Use Topics   Alcohol use: Yes    Alcohol/week: 0.0 standard drinks   Drug use: No    ALLERGIES:  is allergic to sulfa antibiotics.  MEDICATIONS:  Current Outpatient Medications  Medication Sig Dispense Refill   Ascorbic Acid (VITAMIN C) 1000 MG tablet Take 1,000 mg by mouth daily.     BIOTIN PO Take by mouth daily.      Cholecalciferol (VITAMIN D3 PO) Take 1 capsule by mouth daily. Take 1000 Iu by  mouth daily.     ezetimibe (ZETIA) 10 MG tablet TAKE 1 TABLET BY MOUTH EVERY DAY 30 tablet 10   MEGARED OMEGA-3 KRILL OIL PO Take by mouth daily.      Milk Thistle 175 MG CAPS Take by mouth daily.     rosuvastatin (CRESTOR) 10 MG tablet Take 1 tablet (10 mg total) by mouth daily. 90 tablet 3   VIT B12-METHIONINE-INOS-CHOL IM daily.      No current facility-administered medications for this visit.    PHYSICAL EXAMINATION: ECOG PERFORMANCE STATUS: 0 - Asymptomatic  BP 124/90 (Patient Position: Sitting)   Pulse (!) 107   Temp 99.2 F (37.3 C) (Tympanic)   Resp 18   Wt 194 lb (88 kg)   SpO2 99%   BMI 29.72 kg/m   Filed Weights   10/17/20 1047  Weight: 194 lb (88 kg)    Physical Exam Vitals and nursing note reviewed.  HENT:     Head:  Normocephalic and atraumatic.     Mouth/Throat:     Pharynx: Oropharynx is clear.  Eyes:     Extraocular Movements: Extraocular movements intact.     Pupils: Pupils are equal, round, and reactive to light.  Cardiovascular:     Rate and Rhythm: Normal rate and regular rhythm.  Pulmonary:     Comments: Decreased breath sounds bilaterally.  Abdominal:     Palpations: Abdomen is soft.  Musculoskeletal:        General: Normal range of motion.     Cervical back: Normal range of motion.  Skin:    General: Skin is warm.  Neurological:     General: No focal deficit  present.     Mental Status: She is alert and oriented to person, place, and time.  Psychiatric:        Behavior: Behavior normal.        Judgment: Judgment normal.       LABORATORY DATA:  I have reviewed the data as listed    Component Value Date/Time   NA 137 10/17/2020 1014   NA 141 11/23/2013 1054   K 3.9 10/17/2020 1014   K 3.6 11/23/2013 1054   CL 100 10/17/2020 1014   CL 106 11/23/2013 1054   CO2 30 10/17/2020 1014   CO2 24 11/23/2013 1054   GLUCOSE 118 (H) 10/17/2020 1014   GLUCOSE 92 11/23/2013 1054   BUN 14 10/17/2020 1014   BUN 10 11/23/2013 1054   CREATININE 0.86 10/17/2020 1014   CREATININE 0.91 11/23/2013 1054   CALCIUM 9.1 10/17/2020 1014   CALCIUM 9.6 11/23/2013 1054   PROT 8.1 10/17/2020 1014   PROT 8.0 04/17/2019 1318   PROT 7.9 11/23/2013 1054   ALBUMIN 4.1 10/17/2020 1014   ALBUMIN 5.0 (H) 04/17/2019 1318   ALBUMIN 3.8 11/23/2013 1054   AST 20 10/17/2020 1014   AST 17 11/23/2013 1054   ALT 19 10/17/2020 1014   ALT 38 11/23/2013 1054   ALKPHOS 58 10/17/2020 1014   ALKPHOS 104 11/23/2013 1054   BILITOT 0.6 10/17/2020 1014   BILITOT 0.4 04/17/2019 1318   BILITOT 0.3 11/23/2013 1054   GFRNONAA >60 10/17/2020 1014   GFRNONAA >60 11/23/2013 1054   GFRNONAA 57 (L) 04/14/2013 0925   GFRAA >60 10/30/2018 1027   GFRAA >60 11/23/2013 1054   GFRAA >60 04/14/2013 0925    No results found  for: SPEP, UPEP  Lab Results  Component Value Date   WBC 6.0 10/17/2020   NEUTROABS 3.5 10/17/2020   HGB 13.9 10/17/2020   HCT 43.8 10/17/2020   MCV 91.6 10/17/2020   PLT 228 10/17/2020      Chemistry      Component Value Date/Time   NA 137 10/17/2020 1014   NA 141 11/23/2013 1054   K 3.9 10/17/2020 1014   K 3.6 11/23/2013 1054   CL 100 10/17/2020 1014   CL 106 11/23/2013 1054   CO2 30 10/17/2020 1014   CO2 24 11/23/2013 1054   BUN 14 10/17/2020 1014   BUN 10 11/23/2013 1054   CREATININE 0.86 10/17/2020 1014   CREATININE 0.91 11/23/2013 1054      Component Value Date/Time   CALCIUM 9.1 10/17/2020 1014   CALCIUM 9.6 11/23/2013 1054   ALKPHOS 58 10/17/2020 1014   ALKPHOS 104 11/23/2013 1054   AST 20 10/17/2020 1014   AST 17 11/23/2013 1054   ALT 19 10/17/2020 1014   ALT 38 11/23/2013 1054   BILITOT 0.6 10/17/2020 1014   BILITOT 0.4 04/17/2019 1318   BILITOT 0.3 11/23/2013 1054       RADIOGRAPHIC STUDIES: I have personally reviewed the radiological images as listed and agreed with the findings in the report. No results found.   ASSESSMENT & PLAN:  Carcinoma of overlapping sites of right breast in female, estrogen receptor positive ([place]) # TRIPLE POSITIVE BREAST CANCER:  S/p 10 years- OCT 2022- [facility].  Continue monitoring at this time.  Stable.  Notice of recurrence.  # bone density: NOV 2021-= The BMD measured at Femur Total Right is 0.919 g/cm2 with a T-score of -0.7. This patient is considered normal according to [facility]) criteria.  We will  get repeat in 1 year.  Stable.  # Genetics testing: BRCA 1; &2-  # Genetics: Discussed with the patient majority of breast cancers are sporadic however 10 to 20% at risk of genetic/hereditary cancer syndromes.  Discussed importance of genetic counseling/genetic testing.  One daughter- 29 [2022]; one ssiter- [53 years]; one brother- 40 y. Pt wants to think about it; will inform us if interested.   #  DISPOSITION: # follow up in 12 months;MD;  labs- cbc/cmp/ca-27-29-  mammo/BMD prior-- prior-Dr.B   Orders Placed This Encounter  Procedures   DG Bone Density    Standing Status:   Future    Standing Expiration Date:   10/17/2021    Order Specific Question:   Reason for Exam (SYMPTOM  OR DIAGNOSIS REQUIRED)    Answer:   Surveillance of osteoporosis.    Order Specific Question:   Preferred imaging location?    Answer:   [facility]    Order Specific Question:   Is the patient pregnant?    Answer:   No   MM 3D SCREEN BREAST BILATERAL    Standing Status:   Future    Standing Expiration Date:   10/17/2021    Order Specific Question:   Reason for Exam (SYMPTOM  OR DIAGNOSIS REQUIRED)    Answer:   h/o breast cancer; annual surveillance    Order Specific Question:   Preferred imaging location?    Answer:   [facility]    Order Specific Question:   Is the patient pregnant?    Answer:   No   CBC with Differential/Platelet    Standing Status:   Future    Standing Expiration Date:   10/17/2021   Comprehensive metabolic panel    Standing Status:   Future    Standing Expiration Date:   10/17/2021   Cancer antigen 27.29    Standing Status:   Future    Standing Expiration Date:   10/17/2021   All questions were answered. The patient knows to call the clinic with any problems, questions or concerns.      Cammie Sickle, MD 10/17/2020 4:40 PM

## 2020-10-17 NOTE — Assessment & Plan Note (Addendum)
#  TRIPLE POSITIVE BREAST CANCER:  S/p 10 years- OCT 2022- [facility].  Continue monitoring at this time.  Stable.  Notice of recurrence.  # bone density: NOV 2021-= The BMD measured at Femur Total Right is 0.919 g/cm2 with a T-score of -0.7. This patient is considered normal according to [facility]) criteria.  We will get repeat in 1 year.  Stable.  # Genetics testing: BRCA 1; &2-  # Genetics: Discussed with the patient majority of breast cancers are sporadic however 10 to 20% at risk of genetic/hereditary cancer syndromes.  Discussed importance of genetic counseling/genetic testing.  One daughter- 6 [2022]; one ssiter- [53 years]; one brother- 85 y. Pt wants to think about it; will inform us if interested.  # DISPOSITION: # follow up in 12 months;MD;  labs- cbc/cmp/ca-27-29-  mammo/BMD prior-- prior-Dr.B

## 2020-10-18 ENCOUNTER — Other Ambulatory Visit: Payer: 59

## 2020-10-18 ENCOUNTER — Ambulatory Visit: Payer: 59 | Admitting: Internal Medicine

## 2020-10-18 LAB — CANCER ANTIGEN 27.29: CA 27.29: 8 U/mL (ref 0.0–38.6)

## 2020-11-01 ENCOUNTER — Ambulatory Visit: Payer: 59 | Admitting: Obstetrics and Gynecology

## 2020-11-17 ENCOUNTER — Telehealth: Payer: Self-pay | Admitting: Gastroenterology

## 2020-11-17 NOTE — Telephone Encounter (Signed)
Returned [employer].Left voicemail message for patient to call office for an appt.  ===View-only below this line===

## 2020-11-24 ENCOUNTER — Other Ambulatory Visit: Payer: Self-pay

## 2020-11-24 ENCOUNTER — Ambulatory Visit (INDEPENDENT_AMBULATORY_CARE_PROVIDER_SITE_OTHER): Payer: 59 | Admitting: Obstetrics and Gynecology

## 2020-11-24 ENCOUNTER — Other Ambulatory Visit (HOSPITAL_COMMUNITY)
Admission: RE | Admit: 2020-11-24 | Discharge: 2020-11-24 | Disposition: A | Discharge status: Home / Self Care | Payer: 59 | Source: Ambulatory Visit | Attending: Obstetrics and Gynecology | Admitting: Obstetrics and Gynecology

## 2020-11-24 ENCOUNTER — Encounter: Payer: Self-pay | Admitting: Obstetrics and Gynecology

## 2020-11-24 VITALS — BP 134/76 | Ht 68.0 in | Wt 201.0 lb

## 2020-11-24 DIAGNOSIS — Z1231 Encounter for screening mammogram for malignant neoplasm of breast: Secondary | ICD-10-CM

## 2020-11-24 DIAGNOSIS — N879 Dysplasia of cervix uteri, unspecified: Secondary | ICD-10-CM | POA: Insufficient documentation

## 2020-11-24 DIAGNOSIS — Z124 Encounter for screening for malignant neoplasm of cervix: Secondary | ICD-10-CM | POA: Insufficient documentation

## 2020-11-24 DIAGNOSIS — Z01419 Encounter for gynecological examination (general) (routine) without abnormal findings: Secondary | ICD-10-CM

## 2020-11-24 DIAGNOSIS — Z853 Personal history of malignant neoplasm of breast: Secondary | ICD-10-CM

## 2020-11-24 NOTE — Patient Instructions (Signed)
I value your feedback and you entrusting us with your care. If you get a [facility] patient survey, I would appreciate you taking the time to let us know about your experience today. Thank you! ? ? ?

## 2020-11-24 NOTE — Progress Notes (Signed)
c  PCP: Burnard Hawthorne, FNP   Chief Complaint  Patient presents with   Gynecologic Exam    No concerns    HPI:      Teresa Franco is a 60 y.o. G2P1011 who LMP was No LMP recorded. Patient is postmenopausal., presents today for her annual examination.  Her menses are absent and she is postmenopausal. She does not have PMB. She does have occas vasomotor sx.   Sex activity: single partner, contraception - post menopausal status. She does not have vaginal dryness.  Last Pap:  10/13/19 Results were normal/neg HPV DNA. 03/24/18  ASCUS with neg HPV DNA, same result 2018. Repeat due this yr.  November 09, 2015  Results were: low-grade squamous intraepithelial neoplasia (LGSIL - encompassing HPV,mild dysplasia,CIN I) /neg HPV DNA. Colpo 12/05/15 with Dr. Georgianne Fick. 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  (Per Dr. Georgianne Fick, pts with recurrent LGSIL and neg colpo/bx may be better suited for LEEP.) Hx of STDs: HPV  Last mammogram: 10/13/20 with oncology; Results were: normal--routine follow-up in 12 months.  Pt is s/p RT breast cancer, her2/neu+ in 2004 (age 55). Had lumpectomy, chemo, rad, 5 yrs of tamoxifen, and 5 yrs of letrozole. Not on any tx currently. Followed by onc yearly.   There is a FH of breast cancer in 2 mat grt aunts. Pt did BRCA genetic testing through [facility] a few yrs ago. Panel testing done. There is no FH of ovarian cancer. The patient does do self-breast exams. She takes Vit D supp.  Colonoscopy: colonoscopy 11/21 with Dr. Vicente Males without abnormalities. Repeat due after 10 years per pt.   DEXA: 11/21 normal spine and hip  Tobacco use: The patient denies current or previous tobacco use. Alcohol use: none  No drug use Exercise: mod active  She does get adequate calcium and Vitamin D in her diet.  Labs with PCP.  Past Medical History:  Diagnosis Date   Breast cancer ([place]) 2004   chemo/ radiation; Her2/neu +   Dysrhythmia    palpitations   Hyperlipidemia    LGSIL on Pap smear  of cervix    Personal history of chemotherapy    Personal history of radiation therapy    Prediabetes    Seasonal allergies    UTI (urinary tract infection)     Past Surgical History:  Procedure Laterality Date   BREAST BIOPSY Right 07/2002   positive   BREAST BIOPSY Right 2010   negative   BREAST LUMPECTOMY Right 08/09/2002   positive for breast cancer   BROW LIFT Bilateral 06/25/2017   Procedure: BLEPHAROPLASTY UPPER EYELID WITH EXCESS SKIN;  Surgeon: Karle Starch, MD;  Location: [facility];  Service: Ophthalmology;  Laterality: Bilateral;   COLONOSCOPY  03/31/2010   COLONOSCOPY WITH PROPOFOL N/A 11/12/2019   Procedure: COLONOSCOPY WITH PROPOFOL;  Surgeon: Jonathon Bellows, MD;  Location: [facility] ENDOSCOPY;  Service: Gastroenterology;  Laterality: N/A;   LEEP  04/10/1993   OTHER SURGICAL HISTORY  06/2017   eyelid, took off excessive skin    Family History  Problem Relation Age of Onset   Hyperlipidemia Mother    Heart disease Mother    Thyroid disease Mother    Hyperlipidemia Father    Heart disease Father 73       MI- died at 78 ? SCD   Hypertension Father    Breast cancer Maternal Aunt 50   Breast cancer Maternal Aunt 60   Colon cancer Neg Hx    Ovarian cancer Neg  Hx    Melanoma Neg Hx     Social History   Socioeconomic History   Marital status: Married    Spouse name: Not on file   Number of children: Not on file   Years of education: Not on file   Highest education level: Not on file  Occupational History   Not on file  Tobacco Use   Smoking status: Former    Types: Cigarettes    Quit date: 01/08/1997    Years since quitting: 23.8   Smokeless tobacco: Never  Vaping Use   Vaping Use: Never used  Substance and Sexual Activity   Alcohol use: Yes    Alcohol/week: 0.0 standard drinks   Drug use: No   Sexual activity: Yes    Birth control/protection: Post-menopausal  Other Topics Concern   Not on file  Social History Narrative   From [place]    Works for tele[employer]    1 daughter            Social Determinants of Health   Financial Resource Strain: Not on file  Food Insecurity: Not on file  Transportation Needs: Not on file  Physical Activity: Not on file  Stress: Not on file  Social Connections: Not on file  Intimate Partner Violence: Not on file    Current Meds  Medication Sig   Ascorbic Acid (VITAMIN C) 1000 MG tablet Take 1,000 mg by mouth daily.   BIOTIN PO Take by mouth daily.    Cholecalciferol (VITAMIN D3 PO) Take 1 capsule by mouth daily. Take 1000 Iu by  mouth daily.   ezetimibe (ZETIA) 10 MG tablet TAKE 1 TABLET BY MOUTH EVERY DAY   MEGARED OMEGA-3 KRILL OIL PO Take by mouth daily.    Milk Thistle 175 MG CAPS Take by mouth daily.   rosuvastatin (CRESTOR) 10 MG tablet Take 1 tablet (10 mg total) by mouth daily.   VIT B12-METHIONINE-INOS-CHOL IM daily.       ROS:  Review of Systems  Constitutional:  Negative for fatigue, fever and unexpected weight change.  Respiratory:  Negative for cough, shortness of breath and wheezing.   Cardiovascular:  Negative for chest pain, palpitations and leg swelling.  Gastrointestinal:  Negative for blood in stool, constipation, diarrhea, nausea and vomiting.  Endocrine: Negative for cold intolerance, heat intolerance and polyuria.  Genitourinary:  Negative for dyspareunia, dysuria, flank pain, frequency, genital sores, hematuria, menstrual problem, pelvic pain, urgency, vaginal bleeding, vaginal discharge and vaginal pain.  Musculoskeletal:  Negative for back pain, joint swelling and myalgias.  Skin:  Negative for rash.  Neurological:  Negative for dizziness, syncope, light-headedness, numbness and headaches.  Hematological:  Negative for adenopathy.  Psychiatric/Behavioral:  Negative for agitation, confusion, sleep disturbance and suicidal ideas. The patient is not nervous/anxious.     Objective: BP 134/76   Ht 5' 8"  (1.727 m)   Wt 201 lb (91.2 kg)   BMI  30.56 kg/m    Physical Exam Constitutional:      Appearance: She is well-developed.  Genitourinary:     Vulva normal.     Right Labia: No rash, tenderness or lesions.    Left Labia: No tenderness, lesions or rash.    No vaginal discharge, erythema or tenderness.      Right Adnexa: not tender and no mass present.    Left Adnexa: not tender and no mass present.    No cervical motion tenderness, friability or polyp.     Uterus is not enlarged or tender.  Breasts:    Right: No mass, nipple discharge, skin change or tenderness.     Left: No mass, nipple discharge, skin change or tenderness.  Neck:     Thyroid: No thyromegaly.  Cardiovascular:     Rate and Rhythm: Normal rate and regular rhythm.     Heart sounds: Normal heart sounds. No murmur heard. Pulmonary:     Effort: Pulmonary effort is normal.     Breath sounds: Normal breath sounds.  Abdominal:     Palpations: Abdomen is soft.     Tenderness: There is no abdominal tenderness. There is no guarding or rebound.  Musculoskeletal:        General: Normal range of motion.     Cervical back: Normal range of motion.  Lymphadenopathy:     Cervical: No cervical adenopathy.  Neurological:     General: No focal deficit present.     Mental Status: She is alert and oriented to person, place, and time.     Cranial Nerves: No cranial nerve deficit.  Skin:    General: Skin is warm and dry.  Psychiatric:        Mood and Affect: Mood normal.        Behavior: Behavior normal.        Thought Content: Thought content normal.        Judgment: Judgment normal.  Vitals reviewed.    Assessment/Plan: Encounter for annual routine gynecological examination  Cervical cancer screening - Plan: Cytology - PAP  Cervical dysplasia - Plan: Cytology - PAP; repeat pap today.  Encounter for screening mammogram for malignant neoplasm of breast; pt current on mammo, followed by oncology  History of right breast cancer--doing well.         GYN  counsel mammography screening, adequate intake of calcium and vitamin D, diet and exercise    F/U  Return in about 1 year (around 11/24/2021).  [place] B. Asenath Balash, PA-C 11/24/2020 2:25 PM

## 2020-11-30 LAB — CYTOLOGY - PAP

## 2020-12-04 NOTE — Progress Notes (Signed)
Pls call pt to sched colpo with Dr. Georgianne Fick. Thx.

## 2020-12-12 ENCOUNTER — Other Ambulatory Visit: Payer: Self-pay

## 2020-12-12 ENCOUNTER — Ambulatory Visit: Payer: 59 | Admitting: Family

## 2020-12-12 ENCOUNTER — Encounter: Payer: Self-pay | Admitting: Family

## 2020-12-12 VITALS — BP 110/84 | HR 96 | Temp 97.8°F | Ht 68.0 in | Wt 191.0 lb

## 2020-12-12 DIAGNOSIS — E119 Type 2 diabetes mellitus without complications: Secondary | ICD-10-CM

## 2020-12-12 DIAGNOSIS — R7303 Prediabetes: Secondary | ICD-10-CM

## 2020-12-12 DIAGNOSIS — Z0000 Encounter for general adult medical examination without abnormal findings: Secondary | ICD-10-CM | POA: Diagnosis not present

## 2020-12-12 DIAGNOSIS — E782 Mixed hyperlipidemia: Secondary | ICD-10-CM | POA: Diagnosis not present

## 2020-12-12 LAB — LIPID PANEL
Cholesterol: 112 mg/dL (ref 0–200)
HDL: 42.5 mg/dL (ref >39.00)
LDL Cholesterol: 53 mg/dL (ref 0–99)
NonHDL: 69.14
Total CHOL/HDL Ratio: 3
Triglycerides: 83 mg/dL (ref 0.0–149.0)
VLDL: 16.6 mg/dL (ref 0.0–40.0)

## 2020-12-12 LAB — POCT GLYCOSYLATED HEMOGLOBIN (HGB A1C): Hemoglobin A1C: 5.9 % — AB (ref 4.0–5.6)

## 2020-12-12 LAB — MICROALBUMIN / CREATININE URINE RATIO
Creatinine,U: 236.1 mg/dL
Microalb Creat Ratio: 0.5 mg/g (ref 0.0–30.0)
Microalb, Ur: 1.1 mg/dL (ref 0.0–1.9)

## 2020-12-12 NOTE — Assessment & Plan Note (Signed)
Chronic, anticipate stable.  Pending lipid panel.  Continue Zetia 46m, atorvastatin 182m

## 2020-12-12 NOTE — Assessment & Plan Note (Signed)
Lab Results  Component Value Date   HGBA1C 5.9 (A) 12/12/2020   A1c is stable.  Patient to continue to focus on low glycemic diet.  We discussed nutrition consult if a1c were to increase at follow-up.

## 2020-12-12 NOTE — Progress Notes (Signed)
Subjective:    Patient ID: Teresa Franco, female    DOB: [birth year], 60 y.o.   MRN: [id number]  CC: Teresa Franco is a 60 y.o. female who presents today for follow up.   HPI: Feels well today.  No complaints Hyperlipidemia-compliant with Zetia 50m, atorvastatin 138mHistory of prediabetes-she has been diligent with low glycemic diet   HISTORY:  Past Medical History:  Diagnosis Date   [place]   [place]/ radiation; Her2/neu +   Dysrhythmia    palpitations   Hyperlipidemia    LGSIL on Pap smear of cervix    Personal history of chemotherapy    Personal history of radiation therapy    Prediabetes    Seasonal allergies    UTI (urinary tract infection)    Past Surgical History:  Procedure Laterality Date   BREAST BIOPSY Right 07/2002   positive   BREAST BIOPSY Right 2010   negative   BREAST LUMPECTOMY Right 08/09/2002   positive for breast cancer   BROW LIFT Bilateral 06/25/2017   Procedure: BLEPHAROPLASTY UPPER EYELID WITH EXCESS SKIN;  Surgeon: FoKarle StarchMD;  [place];  Laterality: Bilateral;   COLONOSCOPY  03/31/2010   COLONOSCOPY WITH PROPOFOL N/A 11/12/2019   Procedure: COLONOSCOPY WITH PROPOFOL;  Surgeon: AnJonathon BellowsMD;  [facility];  Service: Gastroenterology;  Laterality: N/A;   LEEP  04/10/1993   OTHER SURGICAL HISTORY  06/2017   eyelid, took off excessive skin   Family History  Problem Relation Age of Onset   Hyperlipidemia Mother    Heart disease Mother    Thyroid disease Mother    Hyperlipidemia Father    Heart disease Father 2572     MI- died at 253 SCD   Hypertension Father    Breast cancer Maternal Aunt 5063 Breast cancer Maternal Aunt 60   Colon cancer Neg Hx    Ovarian cancer Neg Hx    Melanoma Neg Hx     Allergies: Sulfa antibiotics Current Outpatient Medications on File Prior to Visit  Medication Sig Dispense Refill   Ascorbic Acid (VITAMIN C) 1000 MG tablet  Take 1,000 mg by mouth daily.     BIOTIN PO Take by mouth daily.      Cholecalciferol (VITAMIN D3 PO) Take 1 capsule by mouth daily. Take 1000 Iu by  mouth daily.     ezetimibe (ZETIA) 10 MG tablet TAKE 1 TABLET BY MOUTH EVERY DAY 30 tablet 10   MEGARED OMEGA-3 KRILL OIL PO Take by mouth daily.      Milk Thistle 175 MG CAPS Take by mouth daily.     rosuvastatin (CRESTOR) 10 MG tablet Take 1 tablet (10 mg total) by mouth daily. 90 tablet 3   VIT B12-METHIONINE-INOS-CHOL IM daily.      No current facility-administered medications on file prior to visit.    Social History   Tobacco Use   Smoking status: Former    Types: Cigarettes    Quit date: 01/08/1997    Years since quitting: 23.9   Smokeless tobacco: Never  Vaping Use   Vaping Use: Never used  Substance Use Topics   Alcohol use: Yes    Alcohol/week: 0.0 standard drinks   Drug use: No    Review of Systems  Constitutional:  Negative for chills and fever.  Respiratory:  Negative for cough.   Cardiovascular:  Negative for chest pain and palpitations.  Gastrointestinal:  Negative for nausea and vomiting.     Objective:    BP 110/84 (BP Location: Left Arm, Patient Position: Sitting, Cuff Size: Large)   Pulse 96   Temp 97.8 F (36.6 C)   Ht 5' 8"  (1.727 m)   Wt 191 lb (86.6 kg)   SpO2 99%   BMI 29.04 kg/m  BP Readings from Last 3 Encounters:  12/12/20 110/84  11/24/20 134/76  10/17/20 124/90   Wt Readings from Last 3 Encounters:  12/12/20 191 lb (86.6 kg)  11/24/20 201 lb (91.2 kg)  10/17/20 194 lb (88 kg)    Physical Exam Vitals reviewed.  Constitutional:      Appearance: She is well-developed.  Eyes:     Conjunctiva/sclera: Conjunctivae normal.  Cardiovascular:     Rate and Rhythm: Normal rate and regular rhythm.     Pulses: Normal pulses.     Heart sounds: Normal heart sounds.  Pulmonary:     Effort: Pulmonary effort is normal.     Breath sounds: Normal breath sounds. No wheezing, rhonchi or rales.   Skin:    General: Skin is warm and dry.  Neurological:     Mental Status: She is alert.  Psychiatric:        Speech: Speech normal.        Behavior: Behavior normal.        Thought Content: Thought content normal.       Assessment & Plan:   Problem List Items Addressed This Visit       Other   HLD (hyperlipidemia)    Chronic, anticipate stable.  Pending lipid panel.  Continue Zetia 70m, atorvastatin 128m     Prediabetes    Lab Results  Component Value Date   HGBA1C 5.9 (A) 12/12/2020  A1c is stable.  Patient to continue to focus on low glycemic diet.  We discussed nutrition consult if a1c were to increase at follow-up.      Other Visit Diagnoses     Diabetes mellitus without complication ([facility]   -  Primary   Relevant Orders   POCT HgB A1C (Completed)   Microalbumin/Creatinine Ratio, Urine   Routine adult health maintenance       Relevant Orders   Lipid panel        I am having Teresa Franco maintain her BIOTIN PO, MEGARED OMEGA-3 KRILL OIL PO, Cholecalciferol (VITAMIN D3 PO), VIT B12-METHIONINE-INOS-CHOL IM, Milk Thistle, vitamin C, ezetimibe, and rosuvastatin.   No orders of the defined types were placed in this encounter.   Return precautions given.   Risks, benefits, and alternatives of the medications and treatment plan prescribed today were discussed, and patient expressed understanding.   Education regarding symptom management and diagnosis given to patient on AVS.  Continue to follow with ArBurnard HawthorneFNP for routine health maintenance.   AnLennox Grumblesnd I agreed with plan.   MaMable ParisFNP

## 2020-12-21 ENCOUNTER — Ambulatory Visit: Payer: 59 | Admitting: Gastroenterology

## 2020-12-23 ENCOUNTER — Ambulatory Visit (INDEPENDENT_AMBULATORY_CARE_PROVIDER_SITE_OTHER): Payer: 59 | Admitting: Obstetrics and Gynecology

## 2020-12-23 ENCOUNTER — Other Ambulatory Visit: Payer: Self-pay

## 2020-12-23 ENCOUNTER — Other Ambulatory Visit (HOSPITAL_COMMUNITY)
Admission: RE | Admit: 2020-12-23 | Discharge: 2020-12-23 | Disposition: A | Discharge status: Home / Self Care | Payer: 59 | Source: Ambulatory Visit | Attending: Obstetrics and Gynecology | Admitting: Obstetrics and Gynecology

## 2020-12-23 ENCOUNTER — Encounter: Payer: Self-pay | Admitting: Obstetrics and Gynecology

## 2020-12-23 VITALS — BP 118/72 | Ht 68.0 in | Wt 192.0 lb

## 2020-12-23 DIAGNOSIS — R87612 Low grade squamous intraepithelial lesion on cytologic smear of cervix (LGSIL): Secondary | ICD-10-CM

## 2020-12-23 NOTE — Progress Notes (Signed)
° °  GYNECOLOGY CLINIC COLPOSCOPY PROCEDURE NOTE  60 y.o. G2P1011 here for colposcopy for low-grade squamous intraepithelial neoplasia (LGSIL - encompassing HPV,mild dysplasia,CIN I)  unknown HPV pap smear on 11/24/20. Discussed underlying role for HPV infection in the development of cervical dysplasia, its natural history and progression/regression, need for surveillance.  Is the patient  pregnant: No LMP: No LMP recorded. Patient is postmenopausal. Smoking status:  reports that she quit smoking about 23 years ago. Her smoking use included cigarettes. She has never used smokeless tobacco.   Patient given informed consent, signed copy in the chart, time out was performed.  The patient was position in dorsal lithotomy position. Speculum was placed the cervix was visualized.   After application of acetic acid colposcopic inspection of the cervix was undertaken.   Colposcopy adequate, full visualization of transformation zone: No no visible lesions; one random biopsies obtained.   ECC specimen obtained:  Yes  All specimens were labeled and sent to pathology.   Patient was given post procedure instructions.  Will follow up pathology and manage accordingly.  Routine preventative health maintenance measures emphasized.  OBGyn Exam  Malachy Mood, MD, [facility], [facility]

## 2020-12-27 LAB — SURGICAL PATHOLOGY

## 2021-01-09 ENCOUNTER — Encounter: Payer: Self-pay | Admitting: Obstetrics and Gynecology

## 2021-01-11 ENCOUNTER — Other Ambulatory Visit: Payer: Self-pay

## 2021-01-11 ENCOUNTER — Ambulatory Visit: Payer: 59 | Admitting: Dermatology

## 2021-01-11 DIAGNOSIS — Z8669 Personal history of other diseases of the nervous system and sense organs: Secondary | ICD-10-CM | POA: Diagnosis not present

## 2021-01-11 DIAGNOSIS — D229 Melanocytic nevi, unspecified: Secondary | ICD-10-CM

## 2021-01-11 DIAGNOSIS — L821 Other seborrheic keratosis: Secondary | ICD-10-CM | POA: Diagnosis not present

## 2021-01-11 DIAGNOSIS — D2239 Melanocytic nevi of other parts of face: Secondary | ICD-10-CM | POA: Diagnosis not present

## 2021-01-11 DIAGNOSIS — L820 Inflamed seborrheic keratosis: Secondary | ICD-10-CM

## 2021-01-11 DIAGNOSIS — L814 Other melanin hyperpigmentation: Secondary | ICD-10-CM | POA: Diagnosis not present

## 2021-01-11 NOTE — Patient Instructions (Signed)

## 2021-01-11 NOTE — Progress Notes (Signed)
° °  New Patient Visit  Subjective  Teresa Franco is a 61 y.o. female who presents for the following: Other (New patient - moles and skin tags of face and neck that she would like checked. Also, she had Bell's Palsy in the past and still has some paralysis of left face and has heard that Botox may help and she would like to discuss.). The patient has spots, moles and lesions to be evaluated, some may be new or changing and the patient has concerns that these could be cancer.  The following portions of the chart were reviewed this encounter and updated as appropriate:   Tobacco   Allergies   Meds   Problems   Med Hx   Surg Hx   Fam Hx      Review of Systems:  No other skin or systemic complaints except as noted in HPI or Assessment and Plan.  Objective  Well appearing patient in no apparent distress; mood and affect are within normal limits.  A focused examination was performed including face, neck, left waistline. Relevant physical exam findings are noted in the Assessment and Plan.  Above left brow Regular brown macule     Neck - Anterior Stuck-on, waxy, tan-brown papule or plaque --Discussed benign etiology and prognosis.        Left waistline near groin x 1, left lat neck (in a line) x 3, right neck x 3 (7) Erythematous stuck-on, waxy papule or plaque  Left face         Assessment & Plan   Lentigines - Scattered tan macules - Due to sun exposure - Benign-appering, observe - Recommend daily broad spectrum sunscreen SPF 30+ to sun-exposed areas, reapply every 2 hours as needed. - Call for any changes  Nevus Above left brow See photo Benign appearing, observe  Seborrheic keratosis Neck - Anterior See photo Benign, observe.   Inflamed seborrheic keratosis (7) Left waistline near groin x 1, left lat neck (in a line) x 3, right neck x 3  Destruction of lesion - Left waistline near groin x 1, left lat neck (in a line) x 3, right neck x 3 Complexity:  simple   Destruction method: cryotherapy   Informed consent: discussed and consent obtained   Timeout:  patient name, date of birth, surgical site, and procedure verified Lesion destroyed using liquid nitrogen: Yes   Region frozen until ice ball extended beyond lesion: Yes   Outcome: patient tolerated procedure well with no complications   Post-procedure details: wound care instructions given    History of Bell's palsy Left face See photo Discussed Botox injections -patient had heard that Botox injections could be helpful for her Bell's palsy.  I advised her that I am not an expert in Botox injections for Bell's palsy.  I do inject Botox for cosmetic reasons.   Recommend evaluation with her neurologist.  Return in about 3 months (around 04/11/2021) for ISK follow up.  I, Ashok Cordia, CMA, am acting as scribe for Sarina Ser, MD .  Documentation: I have reviewed the above documentation for accuracy and completeness, and I agree with the above.  Sarina Ser, MD

## 2021-01-12 ENCOUNTER — Encounter: Payer: Self-pay | Admitting: Dermatology

## 2021-01-17 ENCOUNTER — Encounter: Payer: Self-pay | Admitting: Gastroenterology

## 2021-01-17 ENCOUNTER — Ambulatory Visit (INDEPENDENT_AMBULATORY_CARE_PROVIDER_SITE_OTHER): Payer: 59 | Admitting: Gastroenterology

## 2021-01-17 VITALS — BP 119/82 | HR 99 | Temp 98.2°F | Ht 68.0 in | Wt 198.0 lb

## 2021-01-17 DIAGNOSIS — E669 Obesity, unspecified: Secondary | ICD-10-CM | POA: Diagnosis not present

## 2021-01-17 DIAGNOSIS — K760 Fatty (change of) liver, not elsewhere classified: Secondary | ICD-10-CM

## 2021-01-17 NOTE — Progress Notes (Signed)
Jonathon Bellows MD, MRCP(U.K) [address]  [place]  [place], [place] [address]  Main: [phone]  Fax: [phone]   Primary Care Physician: Burnard Hawthorne, FNP  Primary Gastroenterologist:  Dr. Jonathon Bellows   C/c : NAFLD   HPI: Teresa Franco is a 61 y.o. female   Summary of history : She was initially referred and seen for nonalcoholic fatty liver disease. She underwent a right upper quadrant ultrasound on 10/29/2019 for elevated LFTs.  Fatty liver was noted otherwise was unremarkable.  She had labs in October 2021 which showed no abnormalities in her LFTs.  About a year back she had mild elevation in her AST and ALT.  But that has since resolved.Hemoglobin 13.6 g in October 2021.Her screening colonoscopy was in November 2021 which was normal and repeat suggested in 10 years.  She has a diagnosis of prediabetes with HbA1c of 6.0 hyperlipidemia.  CT calcium score of 26.  Interval history   12/28/2019-01/17/2021  10/17/2020 LFTs are normal 12/28/2019 not immune to hepatitis A or B.  Gained 3 pounds since last visit.  LDL is much lower than what it was for his back at 54  She states she received her hepatitis A and B vaccination series.  She states that she does not take her flu or pneumococcal shot ever  Current Outpatient Medications  Medication Sig Dispense Refill   Ascorbic Acid (VITAMIN C) 1000 MG tablet Take 1,000 mg by mouth daily.     BIOTIN PO Take by mouth daily.      Cholecalciferol (VITAMIN D3 PO) Take 1 capsule by mouth daily. Take 1000 Iu by  mouth daily.     ezetimibe (ZETIA) 10 MG tablet TAKE 1 TABLET BY MOUTH EVERY DAY 30 tablet 10   MEGARED OMEGA-3 KRILL OIL PO Take by mouth daily.      Milk Thistle 175 MG CAPS Take by mouth daily.     rosuvastatin (CRESTOR) 10 MG tablet Take 1 tablet (10 mg total) by mouth daily. 90 tablet 3   VIT B12-METHIONINE-INOS-CHOL IM daily.      No current facility-administered medications for this visit.    Allergies  as of 01/17/2021 - Review Complete 01/17/2021  Allergen Reaction Noted   Sulfa antibiotics Rash 05/02/2014    ROS:  General: Negative for anorexia, weight loss, fever, chills, fatigue, weakness. ENT: Negative for hoarseness, difficulty swallowing , nasal congestion. CV: Negative for chest pain, angina, palpitations, dyspnea on exertion, peripheral edema.  Respiratory: Negative for dyspnea at rest, dyspnea on exertion, cough, sputum, wheezing.  GI: See history of present illness. GU:  Negative for dysuria, hematuria, urinary incontinence, urinary frequency, nocturnal urination.  Endo: Negative for unusual weight change.    Physical Examination:   BP 119/82 (BP Location: Left Arm, Patient Position: Sitting, Cuff Size: Normal)    Pulse 99    Temp 98.2 F (36.8 C) (Oral)    Ht 5\' 8"  (1.727 m)    Wt 198 lb (89.8 kg)    BMI 30.11 kg/m   General: Well-nourished, well-developed in no acute distress.  Eyes: No icterus. Conjunctivae pink. Neuro: Alert and oriented x 3.  Grossly intact. Skin: Warm and dry, no jaundice.   Psych: Alert and cooperative, normal mood and affect.   Imaging Studies: No results found.  Assessment and Plan:   Teresa Franco is a 61 y.o. y/o female here to follow up for  fatty liver.  History of prediabetes, hyperlipidemia .  No biochemical  or radiological evidence of cirrhosis.  Mainstay of therapy is lifestyle changes and addressing cardiovascular risk factors for fatty liver disease.  LDL is within range on therapy.  Her blood pressure is in normal limits.  She is consuming a diet rich in fruit and vegetable but has gained a bit of weight which we talked about today.  Explained the main intervention she needs at this point of time is trying to lose some weight   Plan 1.  Check hepatitis A, B antibody level postvaccination to ensure that the therapeutic window 2.  Counseled about weight loss, Mediterranean diet, exercise and aggressive monitoring of blood  pressure, blood glucose. 3.  Check FibroSure score   Dr Jonathon Bellows  MD,MRCP [facility]) Follow up in 1 year

## 2021-01-18 LAB — HEPATITIS A ANTIBODY, TOTAL: hep A Total Ab: NEGATIVE

## 2021-01-18 LAB — HEPATITIS B SURFACE ANTIBODY,QUALITATIVE: Hep B Surface Ab, Qual: REACTIVE

## 2021-01-19 LAB — NASH FIBROSURE
ALPHA 2-MACROGLOBULINS, QN: 137 mg/dL (ref 110–276)
ALT (SGPT) P5P: 27 IU/L (ref 0–40)
AST (SGOT) P5P: 20 IU/L (ref 0–40)
Apolipoprotein A-1: 139 mg/dL (ref 116–209)
Bilirubin, Total: 0.2 mg/dL (ref 0.0–1.2)
Cholesterol, Total: 156 mg/dL (ref 100–199)
Fibrosis Score: 0.04 (ref 0.00–0.21)
GGT: 23 IU/L (ref 0–60)
Glucose: 89 mg/dL (ref 70–99)
Haptoglobin: 266 mg/dL (ref 33–346)
Height: 68 in
NASH Score: 0.5 — ABNORMAL HIGH
Steatosis Score: 0.66 — ABNORMAL HIGH (ref 0.00–0.30)
Triglycerides: 136 mg/dL (ref 0–149)
Weight: 198 [lb_av]

## 2021-01-22 ENCOUNTER — Encounter: Payer: Self-pay | Admitting: Family

## 2021-01-23 ENCOUNTER — Other Ambulatory Visit: Payer: Self-pay

## 2021-01-23 DIAGNOSIS — G510 Bell's palsy: Secondary | ICD-10-CM

## 2021-04-11 ENCOUNTER — Other Ambulatory Visit: Payer: Self-pay

## 2021-04-12 ENCOUNTER — Ambulatory Visit: Payer: 59 | Admitting: Family

## 2021-04-13 ENCOUNTER — Ambulatory Visit: Payer: 59 | Admitting: Dermatology

## 2021-04-13 ENCOUNTER — Encounter: Payer: Self-pay | Admitting: Dermatology

## 2021-04-13 DIAGNOSIS — L578 Other skin changes due to chronic exposure to nonionizing radiation: Secondary | ICD-10-CM | POA: Diagnosis not present

## 2021-04-13 DIAGNOSIS — L820 Inflamed seborrheic keratosis: Secondary | ICD-10-CM

## 2021-04-13 DIAGNOSIS — L821 Other seborrheic keratosis: Secondary | ICD-10-CM

## 2021-04-13 NOTE — Progress Notes (Signed)
? ?  Follow-Up Visit ?  ?Subjective  ?Teresa Franco is a 61 y.o. female who presents for the following: Seborrheic Keratosis (3 month recheck. Left waistline near groin, left lat neck (in a line), right neck. Tx with LN2. Area above left eyebrow is itching). ?The patient has spots, moles and lesions to be evaluated, some may be new or changing and the patient has concerns that these could be cancer. ? ?The following portions of the chart were reviewed this encounter and updated as appropriate:  Tobacco  Allergies  Meds  Problems  Med Hx  Surg Hx  Fam Hx   ?  ?Review of Systems: No other skin or systemic complaints except as noted in HPI or Assessment and Plan. ? ?Objective  ?Well appearing patient in no apparent distress; mood and affect are within normal limits. ? ?A focused examination was performed including neck, torso. Relevant physical exam findings are noted in the Assessment and Plan. ? ?Left waistline near groin x1, above left brow x1, neck x21 (23) ?Erythematous keratotic or waxy stuck-on papule or plaque. ? ? ?Assessment & Plan  ?Inflamed seborrheic keratosis (23) ?Left waistline near groin x1, above left brow x1, neck x21 ? ?Destruction of lesion - Left waistline near groin x1, above left brow x1, neck x21 ?Complexity: simple   ?Destruction method: cryotherapy   ?Informed consent: discussed and consent obtained   ?Timeout:  patient name, date of birth, surgical site, and procedure verified ?Lesion destroyed using liquid nitrogen: Yes   ?Region frozen until ice ball extended beyond lesion: Yes   ?Outcome: patient tolerated procedure well with no complications   ?Post-procedure details: wound care instructions given   ? ?Seborrheic Keratoses ?- Stuck-on, waxy, tan-brown papules and/or plaques  ?- Benign-appearing ?- Discussed benign etiology and prognosis. ?- Observe ?- Call for any changes ? ?Actinic Damage ?- chronic, secondary to cumulative UV radiation exposure/sun exposure over time ?- diffuse  scaly erythematous macules with underlying dyspigmentation ?- Recommend daily broad spectrum sunscreen SPF 30+ to sun-exposed areas, reapply every 2 hours as needed.  ?- Recommend staying in the shade or wearing long sleeves, sun glasses (UVA+UVB protection) and wide brim hats (4-inch brim around the entire circumference of the hat). ?- Call for new or changing lesions. ? ?Return for ISK Follow Up 3-6 months. ? ?I, Emelia Salisbury, CMA, am acting as scribe for Sarina Ser, MD. ?Documentation: I have reviewed the above documentation for accuracy and completeness, and I agree with the above. ? ?Sarina Ser, MD ? ?

## 2021-04-13 NOTE — Patient Instructions (Addendum)
Cryotherapy Aftercare ? ?Wash gently with soap and water everyday.   ?Apply Vaseline and Band-Aid daily until healed.  ? ?Prior to procedure, discussed risks of blister formation, small wound, skin dyspigmentation, or rare scar following cryotherapy. Recommend Vaseline ointment to treated areas while healing.  ? ? ?Seborrheic Keratosis ? ?What causes seborrheic keratoses? ?Seborrheic keratoses are harmless, common skin growths that first appear during adult life.  As time goes by, more growths appear.  Some people may develop a large number of them.  Seborrheic keratoses appear on both covered and uncovered body parts.  They are not caused by sunlight.  The tendency to develop seborrheic keratoses can be inherited.  They vary in color from skin-colored to gray, brown, or even black.  They can be either smooth or have a rough, warty surface.   ?Seborrheic keratoses are superficial and look as if they were stuck on the skin.  Under the microscope this type of keratosis looks like layers upon layers of skin.  That is why at times the top layer may seem to fall off, but the rest of the growth remains and re-grows.   ? ?Treatment ?Seborrheic keratoses do not need to be treated, but can easily be removed in the office.  Seborrheic keratoses often cause symptoms when they rub on clothing or jewelry.  Lesions can be in the way of shaving.  If they become inflamed, they can cause itching, soreness, or burning.  Removal of a seborrheic keratosis can be accomplished by freezing, burning, or surgery. ?If any spot bleeds, scabs, or grows rapidly, please return to have it checked, as these can be an indication of a skin cancer. ? ? ?If You Need Anything After Your Visit ? ?If you have any questions or concerns for your doctor, please call our main line at [phone] and press option 4 to reach your doctor's medical assistant. If no one answers, please leave a voicemail as directed and we will return your call as soon as  possible. Messages left after 4 pm will be answered the following business day.  ? ?You may also send [place] a message via MyChart. We typically respond to MyChart messages within 1-2 business days. ? ?For prescription refills, please ask your pharmacy to contact our office. Our fax number is [phone]. ? ?If you have an urgent issue when the clinic is closed that cannot wait until the next business day, you can page your doctor at the number below.   ? ?Please note that while we do our best to be available for urgent issues outside of office hours, we are not available 24/7.  ? ?If you have an urgent issue and are unable to reach [place], you may choose to seek medical care at your doctor's office, retail clinic, urgent care center, or emergency room. ? ?If you have a medical emergency, please immediately call 911 or go to the emergency department. ? ?Pager Numbers ? ?- Dr. Nehemiah Massed: [phone] ? ?- Dr. Laurence Ferrari: [phone] ? ?- Dr. Nicole Kindred: [phone] ? ?In the event of inclement weather, please call our main line at [phone] for an update on the status of any delays or closures. ? ?Dermatology Medication Tips: ?Please keep the boxes that topical medications come in in order to help keep track of the instructions about where and how to use these. Pharmacies typically print the medication instructions only on the boxes and not directly on the medication tubes.  ? ?If your medication is too expensive, please contact our office  at [phone] option 4 or send [place] a message through [place].  ? ?We are unable to tell what your co-pay for medications will be in advance as this is different depending on your insurance coverage. However, we may be able to find a substitute medication at lower cost or fill out paperwork to get insurance to cover a needed medication.  ? ?If a prior authorization is required to get your medication covered by your insurance company, please allow [place] 1-2 business days to complete this  process. ? ?Drug prices often vary depending on where the prescription is filled and some pharmacies may offer cheaper prices. ? ?The website www.goodrx.com contains coupons for medications through different pharmacies. The prices here do not account for what the cost may be with help from insurance (it may be cheaper with your insurance), but the website can give you the price if you did not use any insurance.  ?- You can print the associated coupon and take it with your prescription to the pharmacy.  ?- You may also stop by our office during regular business hours and pick up a GoodRx coupon card.  ?- If you need your prescription sent electronically to a different pharmacy, notify our office through [facility] or by phone at [phone] option 4. ? ? ? ? ?Si Usted Necesita Algo Despu?s de Su Visita ? ?Tambi?n puede enviarnos un mensaje a trav?s de MyChart. Por lo general respondemos a los mensajes de MyChart en el transcurso de 1 a 2 d?as h?biles. ? ?Para renovar recetas, por favor pida a su farmacia que se ponga en contacto con nuestra oficina. Nuestro n?mero de fax es el [phone]. ? ?Si tiene un asunto urgente cuando la cl?nica est? cerrada y que no puede esperar hasta el siguiente d?a h?bil, puede llamar/localizar a su doctor(a) al n?mero que aparece a continuaci?n.  ? ?Por favor, tenga en cuenta que aunque hacemos todo lo posible para estar disponibles para asuntos urgentes fuera del horario de oficina, no estamos disponibles las 24 horas del d?a, los 7 d?as de la semana.  ? ?Si tiene un problema urgente y no puede comunicarse con nosotros, puede optar por buscar atenci?n m?dica  en el consultorio de su doctor(a), en una cl?nica privada, en un centro de atenci?n urgente o en una sala de emergencias. ? ?Si tiene Engineer, maintenance (IT) m?dica, por favor llame inmediatamente al 911 o vaya a la sala de emergencias. ? ?N?meros de b?per ? ?- Dr. Nehemiah Massed: [phone] ? ?- Dra. Moye: [phone] ? ?- Dra.  Nicole Kindred: [phone] ? ?En caso de inclemencias del tiempo, por favor llame a nuestra l?nea principal al [phone] para una actualizaci?n sobre el estado de cualquier retraso o cierre. ? ?Consejos para la medicaci?n en dermatolog?a: ?Por favor, guarde las cajas en las que vienen los medicamentos de uso t?pico para ayudarle a seguir las instrucciones sobre d?nde y c?mo usarlos. Las farmacias generalmente imprimen las instrucciones del medicamento s?lo en las cajas y no directamente en los tubos del [place].  ? ?Si su medicamento es muy caro, por favor, p?ngase en contacto con Zigmund Daniel llamando al [phone] y presione la opci?n 4 o env?enos un mensaje a trav?s de MyChart.  ? ?No podemos decirle cu?l ser? su copago por los medicamentos por adelantado ya que esto es diferente dependiendo de la cobertura de su seguro. Sin embargo, es posible que podamos encontrar un medicamento sustituto a Electrical engineer un formulario para que el seguro cubra el medicamento que se  considera necesario.  ? ?Si se requiere Ardelia Mems autorizaci?n previa para que su compa??a de seguros [place] su medicamento, por favor perm?tanos de 1 a 2 d?as h?biles para completar este proceso. ? ?Los precios de los medicamentos var?an con frecuencia dependiendo del Environmental consultant de d?nde se surte la receta y alguna farmacias pueden ofrecer precios m?s baratos. ? ?El sitio web www.goodrx.com tiene cupones para medicamentos de Airline pilot. Los precios aqu? no tienen en cuenta lo que podr?a costar con la ayuda del seguro (puede ser m?s barato con su seguro), pero el sitio web puede darle el precio si no utiliz? ning?n seguro.  ?- Puede imprimir el cup?n correspondiente y llevarlo con su receta a la farmacia.  ?- Tambi?n puede pasar por nuestra oficina durante el horario de atenci?n regular y recoger una tarjeta de cupones de GoodRx.  ?- Si necesita que su receta se env?e electr?nicamente a Chiropodist, informe a nuestra oficina a  trav?s de MyChart de [place] o por tel?fono llamando al [phone] y presione la opci?n 4.  ?

## 2021-04-16 ENCOUNTER — Encounter: Payer: Self-pay | Admitting: Dermatology

## 2021-04-17 ENCOUNTER — Other Ambulatory Visit: Payer: Self-pay | Admitting: Cardiovascular Disease

## 2021-04-17 ENCOUNTER — Encounter: Payer: Self-pay | Admitting: Family

## 2021-04-17 ENCOUNTER — Other Ambulatory Visit: Payer: Self-pay

## 2021-04-17 MED ORDER — ROSUVASTATIN CALCIUM 10 MG PO TABS: 10.0000 mg | ORAL_TABLET | Freq: Every day | ORAL | 3 refills | Status: DC

## 2021-04-17 NOTE — Telephone Encounter (Signed)
Please contact pt for future appointment. ?Pt hasn't been seen since 2021. ?Pt has to be seen yearly for continued refills. ?

## 2021-04-17 NOTE — Telephone Encounter (Signed)
Declined will go to pcp for refill and fu prn ?

## 2021-04-20 ENCOUNTER — Other Ambulatory Visit: Payer: Self-pay | Admitting: Cardiovascular Disease

## 2021-04-20 ENCOUNTER — Other Ambulatory Visit: Payer: Self-pay | Admitting: Internal Medicine

## 2021-04-24 ENCOUNTER — Encounter: Payer: Self-pay | Admitting: Family

## 2021-04-24 ENCOUNTER — Ambulatory Visit: Payer: 59 | Admitting: Family

## 2021-04-24 DIAGNOSIS — K7581 Nonalcoholic steatohepatitis (NASH): Secondary | ICD-10-CM

## 2021-04-24 DIAGNOSIS — G510 Bell's palsy: Secondary | ICD-10-CM

## 2021-04-24 DIAGNOSIS — E782 Mixed hyperlipidemia: Secondary | ICD-10-CM

## 2021-04-24 DIAGNOSIS — R7303 Prediabetes: Secondary | ICD-10-CM | POA: Diagnosis not present

## 2021-04-24 NOTE — Assessment & Plan Note (Signed)
Chronic, stable. She is following with Dr Sabra Heck, [facility], ENT for speech therapy.  Will follow ?

## 2021-04-24 NOTE — Assessment & Plan Note (Signed)
Patient reported check A1c at follow-up.  We did discuss briefly metformin and/or Ozempic or Wegovy for weight loss, particularly in the setting of NASH.  She will think about these medications and let me know if something she would like to consider ?

## 2021-04-24 NOTE — Patient Instructions (Signed)
We can consider metformin ( oral) or ozempic or wegovy ( injection). ? ? ? ? ?

## 2021-04-24 NOTE — Assessment & Plan Note (Signed)
Chronic, stable.  Continue Zetia 10 mg, atorvastatin 10 mg ?

## 2021-04-24 NOTE — Progress Notes (Signed)
? ?Subjective:  ? ? Patient ID: Teresa Franco, female    DOB: 06-24-1960, 61 y.o.   MRN: [id number] ? ?CC: [name] is a 61 y.o. female who presents today for follow up.  ? ?HPI: Feels well today.  No new complaints ? ?Hyperlipidemia -compliant with Zetia 10 mg, atorvastatin 10 mg ? ?History of prediabetes  ? ?NASH-following annually with Dr. Vicente Males.  Last seen 01/17/2021. Fibrosure checked ? ?following with [facility] Dr. Sabra Heck ENT ?in [place] for history of left facial Bell's palsy 07/2018  ? ?Following with GYN, Dr. Georgianne Fick, plan to repeat Pap with HPV cotesting 12/2021; showed low-grade squamous intraepithelial lesion CIN-1.  ?Denies personal or family history of thyroid cancer ? ?HISTORY:  ?Past Medical History:  ?Diagnosis Date  ? Breast cancer ([place]) 2004  ? chemo/ radiation; Her2/neu +  ? Dysrhythmia   ? palpitations  ? Hyperlipidemia   ? LGSIL on Pap smear of cervix   ? Personal history of chemotherapy   ? Personal history of radiation therapy   ? Prediabetes   ? Seasonal allergies   ? UTI (urinary tract infection)   ? ?Past Surgical History:  ?Procedure Laterality Date  ? BREAST BIOPSY Right 07/2002  ? positive  ? BREAST BIOPSY Right 2010  ? negative  ? BREAST LUMPECTOMY Right 08/09/2002  ? positive for breast cancer  ? BROW LIFT Bilateral 06/25/2017  ? Procedure: BLEPHAROPLASTY UPPER EYELID WITH EXCESS SKIN;  Surgeon: Karle Starch, MD;  Location: [facility];  Service: Ophthalmology;  Laterality: Bilateral;  ? COLONOSCOPY  03/31/2010  ? COLONOSCOPY WITH PROPOFOL N/A 11/12/2019  ? Procedure: COLONOSCOPY WITH PROPOFOL;  Surgeon: Jonathon Bellows, MD;  Location: [facility];  Service: Gastroenterology;  Laterality: N/A;  ? LEEP  04/10/1993  ? OTHER SURGICAL HISTORY  06/2017  ? eyelid, took off excessive skin  ? ?Family History  ?Problem Relation Age of Onset  ? Hyperlipidemia Mother   ? Heart disease Mother   ? Thyroid disease Mother   ? Hyperlipidemia Father   ? Heart disease  Father 79  ?     MI- died at 78 ? SCD  ? Hypertension Father   ? Breast cancer Maternal Aunt 76  ? Breast cancer Maternal Aunt 60  ? Colon cancer Neg Hx   ? Ovarian cancer Neg Hx   ? Melanoma Neg Hx   ? Thyroid cancer Neg Hx   ? ? ?Allergies: Sulfa antibiotics ?Current Outpatient Medications on File Prior to Visit  ?Medication Sig Dispense Refill  ? Ascorbic Acid (VITAMIN C) 1000 MG tablet Take 1,000 mg by mouth daily.    ? BIOTIN PO Take by mouth daily.     ? Cholecalciferol (VITAMIN D3 PO) Take 1 capsule by mouth daily. Take 1000 Iu by  mouth daily.    ? ezetimibe (ZETIA) 10 MG tablet TAKE 1 TABLET BY MOUTH EVERY DAY 30 tablet 10  ? MEGARED OMEGA-3 KRILL OIL PO Take by mouth daily.     ? Milk Thistle 175 MG CAPS Take by mouth daily.    ? rosuvastatin (CRESTOR) 10 MG tablet Take 1 tablet (10 mg total) by mouth daily. 90 tablet 3  ? VIT B12-METHIONINE-INOS-CHOL IM daily.     ? ?No current facility-administered medications on file prior to visit.  ? ? ?Social History  ? ?Tobacco Use  ? Smoking status: Former  ?  Types: Cigarettes  ?  Quit date: 01/08/1997  ?  Years since quitting:  24.3  ? Smokeless tobacco: Never  ?Vaping Use  ? Vaping Use: Never used  ?Substance Use Topics  ? Alcohol use: Yes  ?  Alcohol/week: 0.0 standard drinks  ? Drug use: No  ? ? ?Review of Systems  ?Constitutional:  Negative for chills and fever.  ?HENT:  Negative for trouble swallowing.   ?Respiratory:  Negative for cough.   ?Cardiovascular:  Negative for chest pain and palpitations.  ?Gastrointestinal:  Negative for nausea and vomiting.  ?   ?Objective:  ?  ?BP 128/82 (BP Location: Left Arm, Patient Position: Sitting, Cuff Size: Normal)   Pulse (!) 106   Temp 97.9 ?F (36.6 ?C) (Oral)   Ht _0  (1.727 m)   Wt 194 lb 9.6 oz (88.3 kg)   SpO2 99%   BMI 29.59 kg/m?  ?BP Readings from Last 3 Encounters:  ?04/24/21 128/82  ?01/17/21 119/82  ?12/23/20 118/72  ? ?Wt Readings from Last 3 Encounters:  ?04/24/21 194 lb 9.6 oz (88.3 kg)  ?01/17/21  198 lb (89.8 kg)  ?12/23/20 192 lb (87.1 kg)  ? ? ?Physical Exam ?Vitals reviewed.  ?Constitutional:   ?   Appearance: She is well-developed.  ?Eyes:  ?   Conjunctiva/sclera: Conjunctivae normal.  ?Neck:  ?   Thyroid: No thyroid mass, thyromegaly or thyroid tenderness.  ?Cardiovascular:  ?   Rate and Rhythm: Normal rate and regular rhythm.  ?   Pulses: Normal pulses.  ?   Heart sounds: Normal heart sounds.  ?Pulmonary:  ?   Effort: Pulmonary effort is normal.  ?   Breath sounds: Normal breath sounds. No wheezing, rhonchi or rales.  ?Skin: ?   General: Skin is warm and dry.  ?Neurological:  ?   Mental Status: She is alert.  ?Psychiatric:     ?   Speech: Speech normal.     ?   Behavior: Behavior normal.     ?   Thought Content: Thought content normal.  ? ? ?   ?Assessment & Plan:  ? ?Problem List Items Addressed This Visit   ? ?  ? Digestive  ? NASH (nonalcoholic steatohepatitis)  ?  ? Nervous and Auditory  ? Bell's palsy  ?  Chronic, stable. She is following with Dr Sabra Heck, [facility], ENT for speech therapy.  Will follow ? ?  ?  ?  ? Other  ? HLD (hyperlipidemia)  ?  Chronic, stable.  Continue Zetia 10 mg, atorvastatin 10 mg ? ?  ?  ? Prediabetes  ?  Patient reported check A1c at follow-up.  We did discuss briefly metformin and/or Ozempic or Wegovy for weight loss, particularly in the setting of NASH.  She will think about these medications and let me know if something she would like to consider ? ?  ?  ? ? ? ?I am having Elmo T. Carrera maintain her BIOTIN PO, MEGARED OMEGA-3 KRILL OIL PO, Cholecalciferol (VITAMIN D3 PO), VIT B12-METHIONINE-INOS-CHOL IM, Milk Thistle, vitamin C, rosuvastatin, and ezetimibe. ? ? ?No orders of the defined types were placed in this encounter. ? ? ?Return precautions given.  ? ?Risks, benefits, and alternatives of the medications and treatment plan prescribed today were discussed, and patient expressed understanding.  ? ?Education regarding symptom management and diagnosis given to patient  on AVS. ? ?Continue to follow with Burnard Hawthorne, FNP for routine health maintenance.  ? ?Lennox Grumbles and I agreed with plan.  ? ?Mable Paris, FNP ? ? ?

## 2021-07-11 IMAGING — US US ABDOMEN LIMITED
1 series · 14 of 25 positions shown · non-contrast
Comparison: None.

CLINICAL DATA: 59-year-old female with elevated LFTs.  NASH.

EXAM:
ULTRASOUND ABDOMEN LIMITED RIGHT UPPER QUADRANT

[Series 1: us abdomen limited · 0.23mm/px · 14 of 67 slices shown]
[im 1/67]
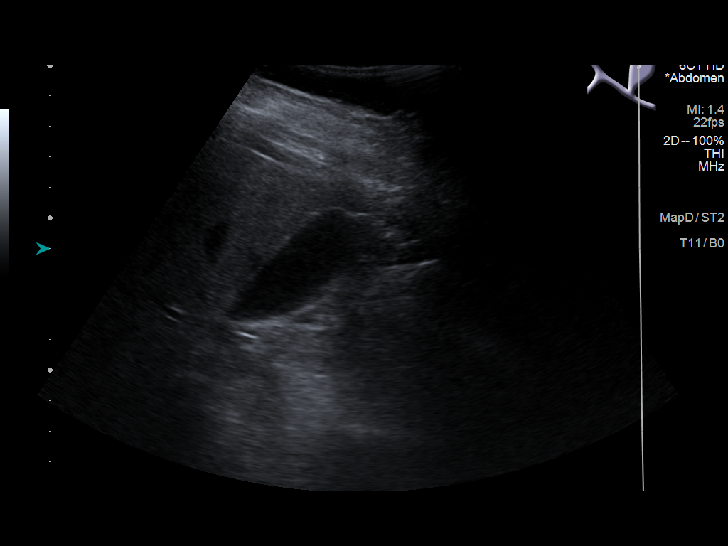
[im 6/67]
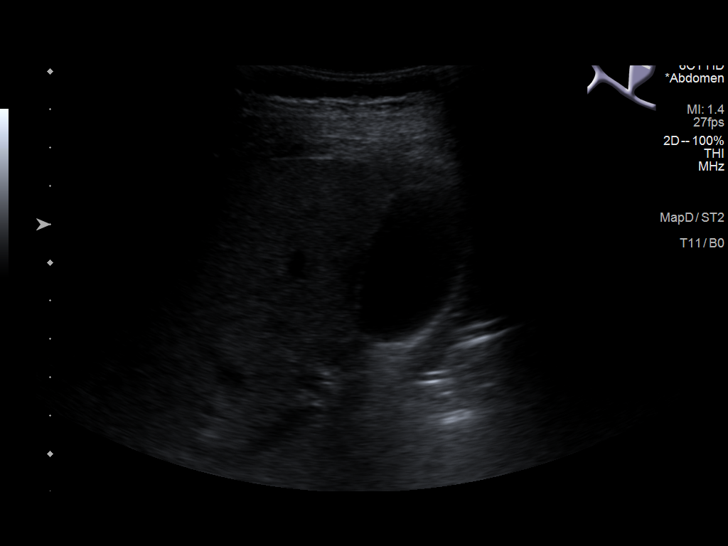
[im 12/67]
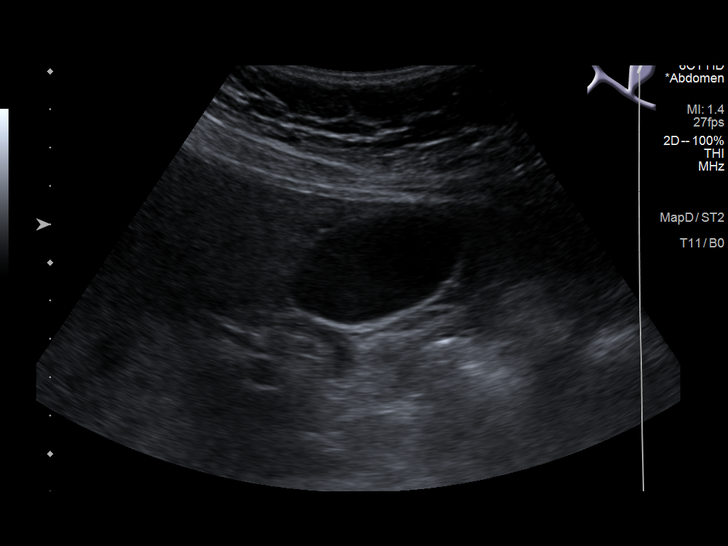
[im 17/67]
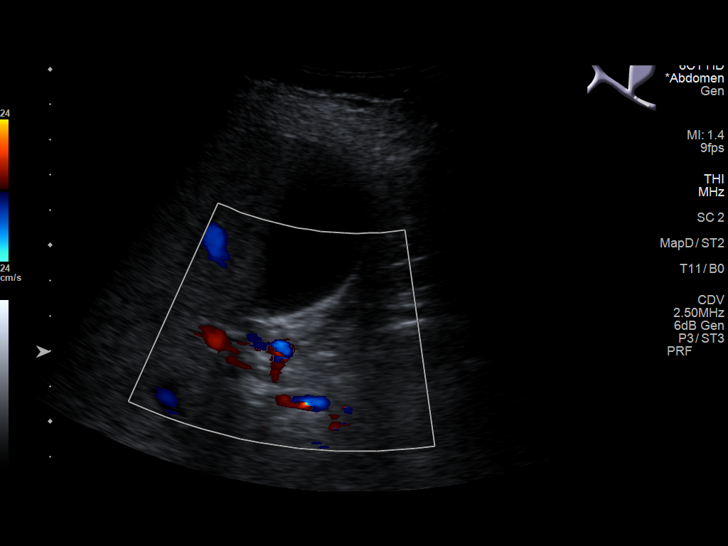
[im 23/67]
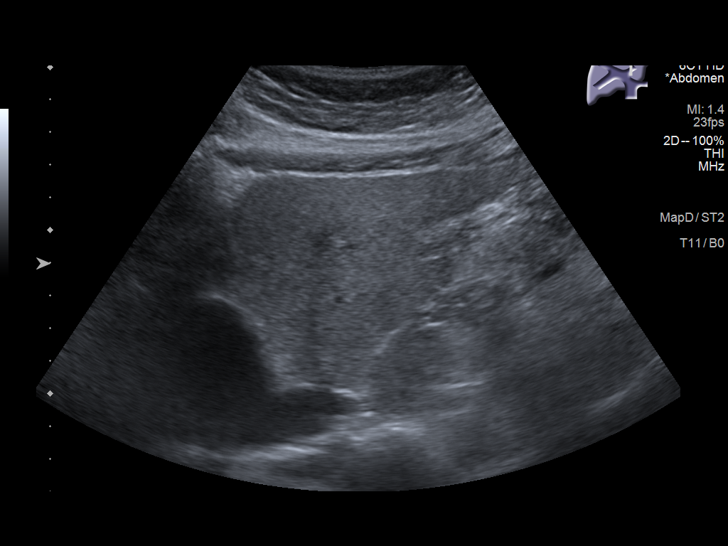
[im 25/67]
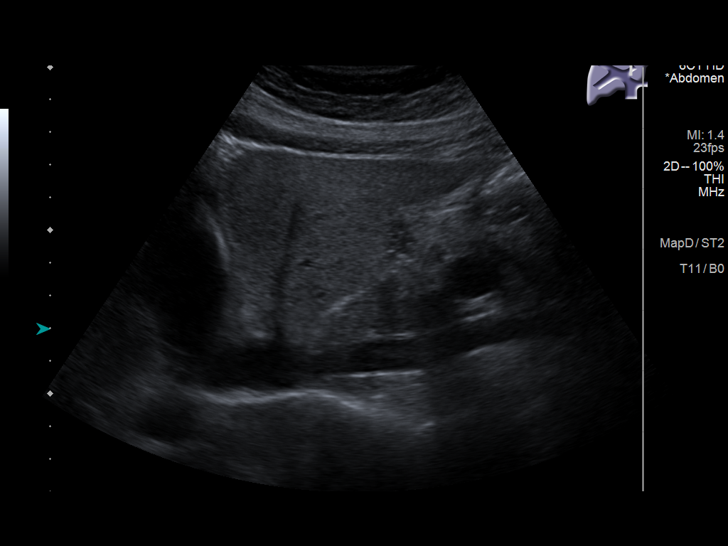
[im 31/67]
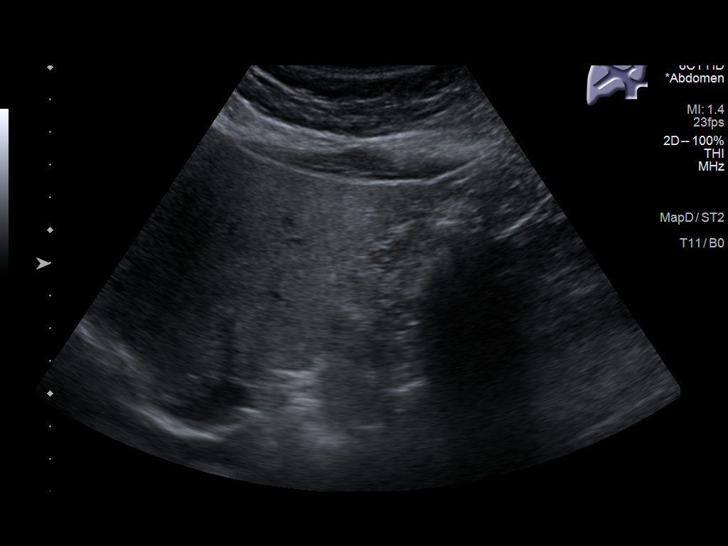
[im 36/67]
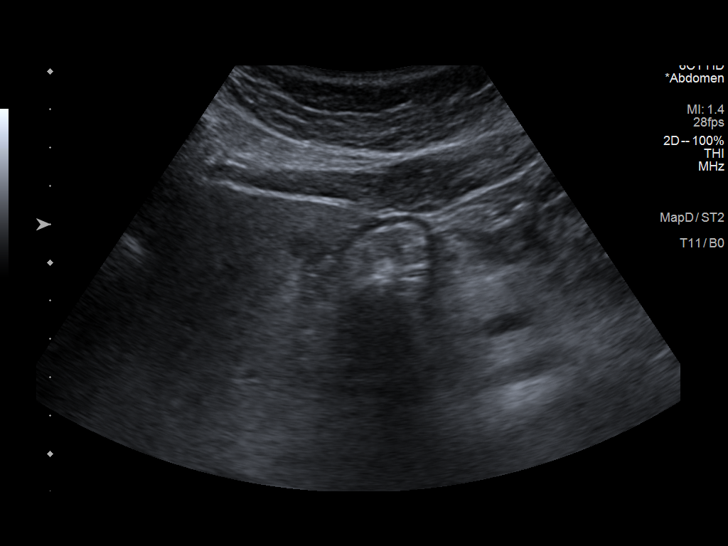
[im 42/67]
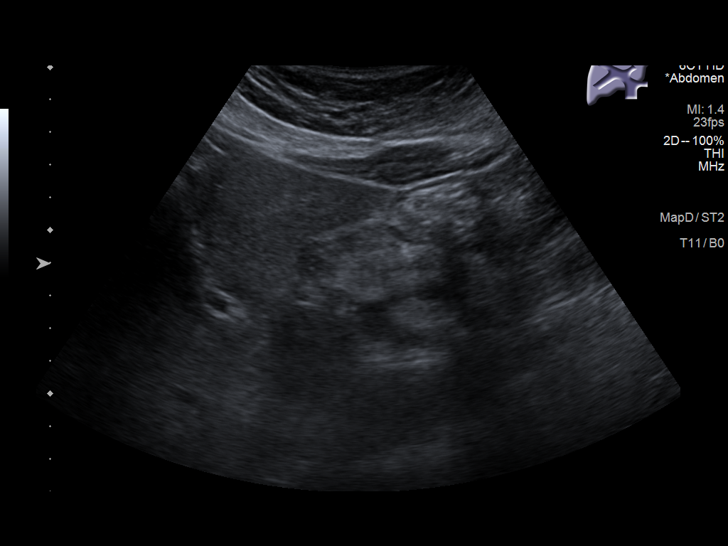
[im 45/67]
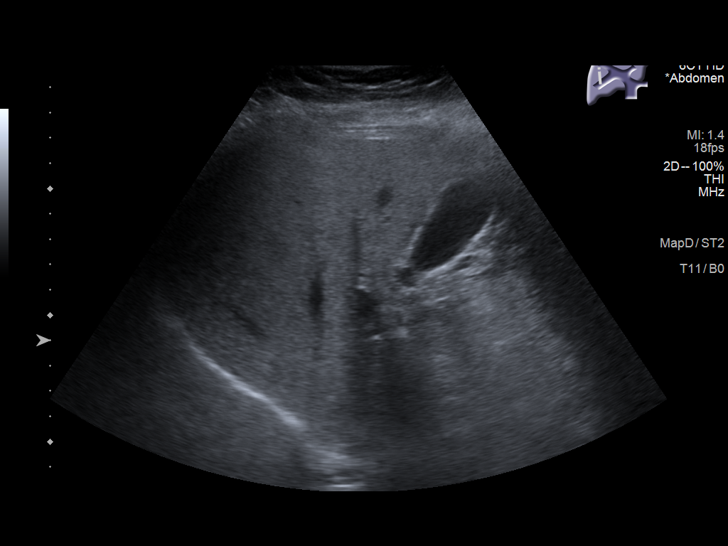
[im 50/67]
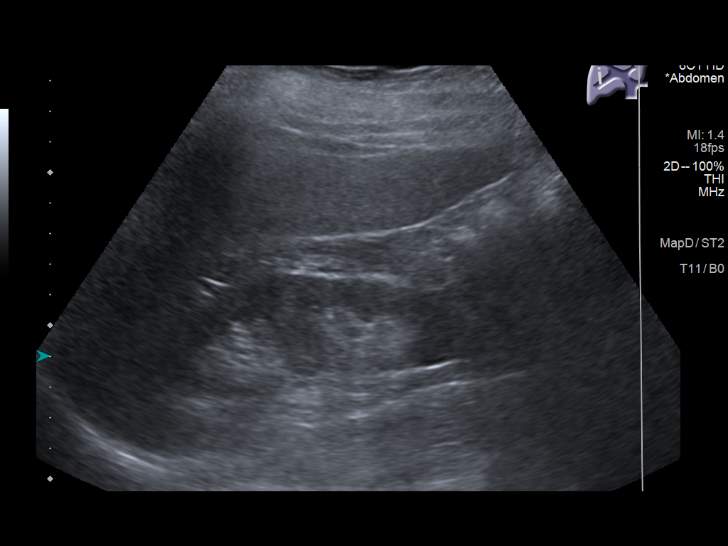
[im 56/67]
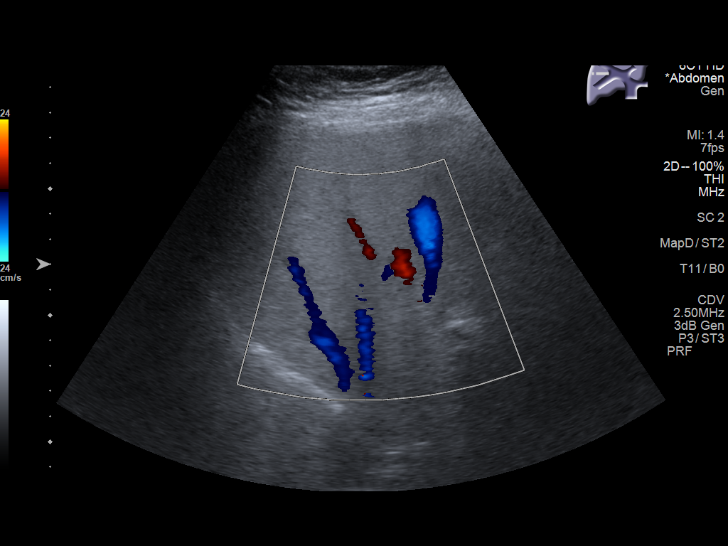
[im 61/67]
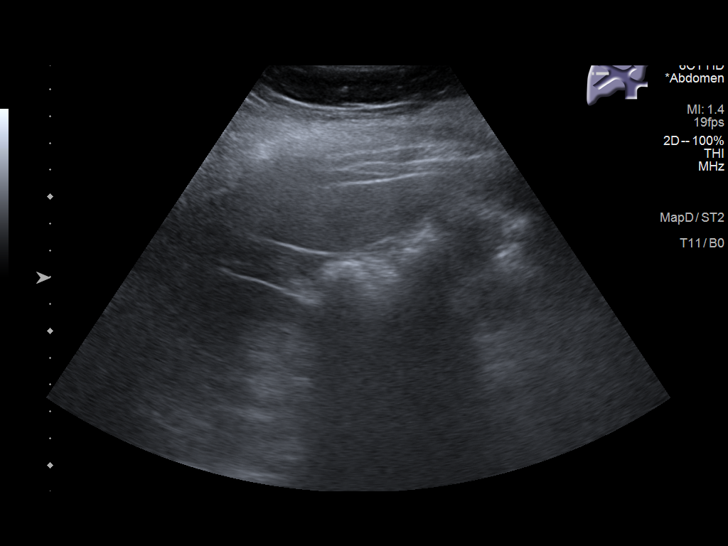
[im 67/67]
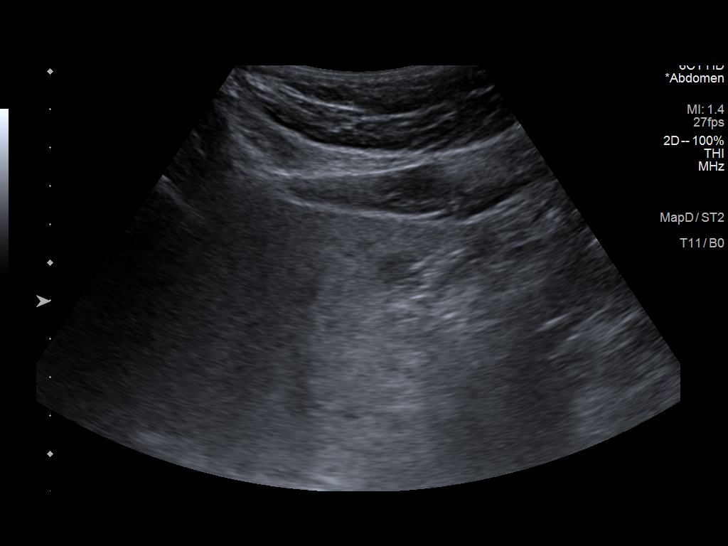

[14 of 25 positions shown; findings below may reference images not displayed]

FINDINGS: Gallbladder:

No gallstones or wall thickening visualized. No sonographic Murphy
sign noted by sonographer.

Common bile duct:

Diameter: 4 mm

Liver:

There is diffuse increased liver echogenicity most commonly seen in
the setting of fatty infiltration. Superimposed inflammation or
fibrosis is not excluded. Clinical correlation is recommended. A 12
mm hypoechoic focus in the left lobe of the liver most likely a
focal area of fat sparing. Portal vein is patent on color Doppler
imaging with normal direction of blood flow towards the liver.

Other: None.
IMPRESSION: Fatty liver, otherwise unremarkable right upper quadrant ultrasound.

## 2021-07-15 ENCOUNTER — Encounter: Payer: Self-pay | Admitting: Family

## 2021-07-18 NOTE — Telephone Encounter (Signed)
See other phone note

## 2021-07-19 ENCOUNTER — Ambulatory Visit: Payer: 59 | Admitting: Dermatology

## 2021-07-26 ENCOUNTER — Encounter: Payer: Self-pay | Admitting: Family

## 2021-07-26 ENCOUNTER — Ambulatory Visit: Payer: 59 | Admitting: Family

## 2021-07-26 VITALS — BP 116/62 | HR 116 | Temp 97.9°F | Ht 68.0 in | Wt 195.4 lb

## 2021-07-26 DIAGNOSIS — R7303 Prediabetes: Secondary | ICD-10-CM | POA: Diagnosis not present

## 2021-07-26 DIAGNOSIS — Z8639 Personal history of other endocrine, nutritional and metabolic disease: Secondary | ICD-10-CM

## 2021-07-26 LAB — POCT GLYCOSYLATED HEMOGLOBIN (HGB A1C): Hemoglobin A1C: 6.4 % — AB (ref 4.0–5.6)

## 2021-07-26 MED ORDER — OZEMPIC (0.25 OR 0.5 MG/DOSE) 2 MG/1.5ML ~~LOC~~ SOPN: 0.2500 mg | PEN_INJECTOR | SUBCUTANEOUS | 3 refills | Status: DC

## 2021-07-26 NOTE — Assessment & Plan Note (Addendum)
Lab Results  Component Value Date   HGBA1C 6.4 (A) 07/26/2021   a1c increased. We discussed trial of ozempic. Discussed black box label as it relates to medullary thyroid cancer, multiple endocrine neoplasia.  Counseled on administration, side effects.  If Ozempic is not approved, we discussed we could trial Wegovy although I am concerned we may not be able to find [facility] until this fall.  She is also agreeable to trial of metformin if GLP-1 is not approved.  Counseled on eating small frequent high-protein meals.  Counseled on low glycemic diet

## 2021-07-26 NOTE — Progress Notes (Signed)
 Subjective:    Patient ID: Teresa Franco, female    DOB: 02/09/1960, 61 y.o.   MRN: [id number]  CC: Teresa Franco is a 61 y.o. female who presents today for follow up.   HPI: Here to discuss prediabetes. She has h/o diabetes   She walks on the treadmill.  She is following a healthy diet.  Eats one larger meal a day, lunch time.  No sweet drinks.   No personal or family h/o thyroid cancer     HISTORY:  Past Medical History:  Diagnosis Date   Breast cancer ([facility]) 2004   chemo/ radiation; Her2/neu +   Dysrhythmia    palpitations   Hyperlipidemia    LGSIL on Pap smear of cervix    Personal history of chemotherapy    Personal history of radiation therapy    Prediabetes    Seasonal allergies    UTI (urinary tract infection)    Past Surgical History:  Procedure Laterality Date   BREAST BIOPSY Right 07/2002   positive   BREAST BIOPSY Right 2010   negative   BREAST LUMPECTOMY Right 08/09/2002   positive for breast cancer   BROW LIFT Bilateral 06/25/2017   Procedure: BLEPHAROPLASTY UPPER EYELID WITH EXCESS SKIN;  Surgeon: Fowler, Amy M, MD;  Location: [facility];  Service: Ophthalmology;  Laterality: Bilateral;   COLONOSCOPY  03/31/2010   COLONOSCOPY WITH PROPOFOL N/A 11/12/2019   Procedure: COLONOSCOPY WITH PROPOFOL;  Surgeon: Anna, Kiran, MD;  Location: [facility] ENDOSCOPY;  Service: Gastroenterology;  Laterality: N/A;   LEEP  04/10/1993   OTHER SURGICAL HISTORY  06/2017   eyelid, took off excessive skin   Family History  Problem Relation Age of Onset   Hyperlipidemia Mother    Heart disease Mother    Thyroid disease Mother        no thyroid cancer   Hyperlipidemia Father    Heart disease Father 25       MI- died at 25 ? SCD   Hypertension Father    Breast cancer Maternal Aunt 50   Diabetes Maternal Aunt    Breast cancer Maternal Aunt 60   Diabetes Maternal Aunt    Colon cancer Neg Hx    Ovarian cancer Neg Hx    Melanoma Neg Hx    Thyroid cancer Neg  Hx     Allergies: Sulfa antibiotics Current Outpatient Medications on File Prior to Visit  Medication Sig Dispense Refill   Ascorbic Acid (VITAMIN C) 1000 MG tablet Take 1,000 mg by mouth daily.     BIOTIN PO Take by mouth daily.      Cholecalciferol (VITAMIN D3 PO) Take 1 capsule by mouth daily. Take 1000 Iu by  mouth daily.     ezetimibe (ZETIA) 10 MG tablet TAKE 1 TABLET BY MOUTH EVERY DAY 30 tablet 10   MEGARED OMEGA-3 KRILL OIL PO Take by mouth daily.      Milk Thistle 175 MG CAPS Take by mouth daily.     rosuvastatin (CRESTOR) 10 MG tablet Take 1 tablet (10 mg total) by mouth daily. 90 tablet 3   VIT B12-METHIONINE-INOS-CHOL IM daily.      No current facility-administered medications on file prior to visit.    Social History   Tobacco Use   Smoking status: Former    Types: Cigarettes    Quit date: 01/08/1997    Years since quitting: 24.5   Smokeless tobacco: Never  Vaping Use   Vaping Use: Never used  Substance   Use Topics   Alcohol use: Yes    Alcohol/week: 0.0 standard drinks of alcohol   Drug use: No    Review of Systems  Constitutional:  Negative for chills and fever.  Respiratory:  Negative for cough.   Cardiovascular:  Negative for chest pain and palpitations.  Gastrointestinal:  Negative for nausea and vomiting.      Objective:    BP 116/62 (BP Location: Left Arm, Patient Position: Sitting, Cuff Size: Large)   Pulse (!) 116   Temp 97.9 F (36.6 C) (Oral)   Ht 5' 8" (1.727 m)   Wt 195 lb 6.4 oz (88.6 kg)   SpO2 97%   BMI 29.71 kg/m  BP Readings from Last 3 Encounters:  07/26/21 116/62  04/24/21 128/82  01/17/21 119/82   Wt Readings from Last 3 Encounters:  07/26/21 195 lb 6.4 oz (88.6 kg)  04/24/21 194 lb 9.6 oz (88.3 kg)  01/17/21 198 lb (89.8 kg)    Physical Exam Vitals reviewed.  Constitutional:      Appearance: She is well-developed.  Eyes:     Conjunctiva/sclera: Conjunctivae normal.  Cardiovascular:     Rate and Rhythm: Normal rate  and regular rhythm.     Pulses: Normal pulses.     Heart sounds: Normal heart sounds.  Pulmonary:     Effort: Pulmonary effort is normal.     Breath sounds: Normal breath sounds. No wheezing, rhonchi or rales.  Skin:    General: Skin is warm and dry.  Neurological:     Mental Status: She is alert.  Psychiatric:        Speech: Speech normal.        Behavior: Behavior normal.        Thought Content: Thought content normal.        Assessment & Plan:   Problem List Items Addressed This Visit       Other   History of diabetes mellitus, type II - Primary    Lab Results  Component Value Date   HGBA1C 6.4 (A) 07/26/2021  a1c increased. We discussed trial of ozempic. Discussed black box label as it relates to medullary thyroid cancer, multiple endocrine neoplasia.  Counseled on administration, side effects.  If Ozempic is not approved, we discussed we could trial Wegovy although I am concerned we may not be able to find Wegovy until this fall.  She is also agreeable to trial of metformin if GLP-1 is not approved.  Counseled on eating small frequent high-protein meals.  Counseled on low glycemic diet      Relevant Medications   Semaglutide,0.25 or 0.5MG/DOS, (OZEMPIC, 0.25 OR 0.5 MG/DOSE,) 2 MG/1.5ML SOPN     I am having Teresa Franco start on Ozempic (0.25 or 0.5 MG/DOSE). I am also having her maintain her BIOTIN PO, MEGARED OMEGA-3 KRILL OIL PO, Cholecalciferol (VITAMIN D3 PO), VIT B12-METHIONINE-INOS-CHOL IM, Milk Thistle, vitamin C, rosuvastatin, and ezetimibe.   Meds ordered this encounter  Medications   Semaglutide,0.25 or 0.5MG/DOS, (OZEMPIC, 0.25 OR 0.5 MG/DOSE,) 2 MG/1.5ML SOPN    Sig: Inject 0.25 mg into the skin once a week. After 4 weeks, increase to 0.5mg [place] qwk.    Dispense:  3 mL    Refill:  3    Order Specific Question:   Supervising Provider    Answer:   TULLO, TERESA L [[id number]]    Return precautions given.   Risks, benefits, and alternatives of the  medications and treatment plan prescribed today were discussed, and   patient expressed understanding.   Education regarding symptom management and diagnosis given to patient on AVS.  Continue to follow with Burnard Hawthorne, FNP for routine health maintenance.   Teresa Franco and I agreed with plan.   Mable Paris, FNP

## 2021-07-26 NOTE — Patient Instructions (Addendum)
Start ozempic 0.67m once per week once per week injected subcutaneously ( [place])  in stomach. Please clean with alcohol swab prior to injection and be sure to rotate site. You may schedule a nurse visit if you would like to first injection.   After 4 weeks, and if tolerated and weight loss has not reached 1-2 lbs per week, please increase to 0.574monce per week .    Please read information on medication below and remember black box warning that you may not take if you or a family member is diagnosed with thyroid cancer (medullary thyroid cancer), or multiple endocrine neoplasia ( MEN).       Semaglutide injection solution What is this medicine? SEMAGLUTIDE (Sem a GLOO tide) is used to improve blood sugar control in adults with type 2 diabetes. This medicine may be used with other diabetes medicines. This drug may also reduce the risk of heart attack or stroke if you have type 2 diabetes and risk factors for heart disease. This medicine may be used for other purposes; ask your health care provider or pharmacist if you have questions. COMMON BRAND NAME(S): OZEMPIC What should I tell my health care provider before I take this medicine? They need to know if you have any of these conditions: endocrine tumors (MEN 2) or if someone in your family had these tumors eye disease, vision problems history of pancreatitis kidney disease stomach problems thyroid cancer or if someone in your family had thyroid cancer an unusual or allergic reaction to semaglutide, other medicines, foods, dyes, or preservatives pregnant or trying to get pregnant breast-feeding How should I use this medicine? This medicine is for injection under the skin of your upper leg (thigh), stomach area, or upper arm. It is given once every week (every 7 days). You will be taught how to prepare and give this medicine. Use exactly as directed. Take your medicine at regular intervals. Do not take it more often than directed. If you use  this medicine with insulin, you should inject this medicine and the insulin separately. Do not mix them together. Do not give the injections right next to each other. Change (rotate) injection sites with each injection. It is important that you put your used needles and syringes in a special sharps container. Do not put them in a trash can. If you do not have a sharps container, call your pharmacist or healthcare provider to get one. A special MedGuide will be given to you by the pharmacist with each prescription and refill. Be sure to read this information carefully each time. This drug comes with INSTRUCTIONS FOR USE. Ask your pharmacist for directions on how to use this drug. Read the information carefully. Talk to your pharmacist or health care provider if you have questions. Talk to your pediatrician regarding the use of this medicine in children. Special care may be needed. Overdosage: If you think you have taken too much of this medicine contact a poison control center or emergency room at once. NOTE: This medicine is only for you. Do not share this medicine with others. What if I miss a dose? If you miss a dose, take it as soon as you can within 5 days after the missed dose. Then take your next dose at your regular weekly time. If it has been longer than 5 days after the missed dose, do not take the missed dose. Take the next dose at your regular time. Do not take double or extra doses. If you have questions  about a missed dose, contact your health care provider for advice. What may interact with this medicine? other medicines for diabetes Many medications may cause changes in blood sugar, these include: alcohol containing beverages antiviral medicines for HIV or AIDS aspirin and aspirin-like drugs certain medicines for blood pressure, heart disease, irregular heart beat chromium diuretics female hormones, such as estrogens or progestins, birth control  pills fenofibrate gemfibrozil isoniazid lanreotide female hormones or anabolic steroids MAOIs like Carbex, Eldepryl, Marplan, Nardil, and Parnate medicines for weight loss medicines for allergies, asthma, cold, or cough medicines for depression, anxiety, or psychotic disturbances niacin nicotine NSAIDs, medicines for pain and inflammation, like ibuprofen or naproxen octreotide pasireotide pentamidine phenytoin probenecid quinolone antibiotics such as ciprofloxacin, levofloxacin, ofloxacin some herbal dietary supplements steroid medicines such as prednisone or cortisone sulfamethoxazole; trimethoprim thyroid hormones Some medications can hide the warning symptoms of low blood sugar (hypoglycemia). You may need to monitor your blood sugar more closely if you are taking one of these medications. These include: beta-blockers, often used for high blood pressure or heart problems (examples include atenolol, metoprolol, propranolol) clonidine guanethidine reserpine This list may not describe all possible interactions. Give your health care provider a list of all the medicines, herbs, non-prescription drugs, or dietary supplements you use. Also tell them if you smoke, drink alcohol, or use illegal drugs. Some items may interact with your medicine. What should I watch for while using this medicine? Visit your doctor or health care professional for regular checks on your progress. Drink plenty of fluids while taking this medicine. Check with your doctor or health care professional if you get an attack of severe diarrhea, nausea, and vomiting. The loss of too much body fluid can make it dangerous for you to take this medicine. A test called the HbA1C (A1C) will be monitored. This is a simple blood test. It measures your blood sugar control over the last 2 to 3 months. You will receive this test every 3 to 6 months. Learn how to check your blood sugar. Learn the symptoms of low and high blood  sugar and how to manage them. Always carry a quick-source of sugar with you in case you have symptoms of low blood sugar. Examples include hard sugar candy or glucose tablets. Make sure others know that you can choke if you eat or drink when you develop serious symptoms of low blood sugar, such as seizures or unconsciousness. They must get medical help at once. Tell your doctor or health care professional if you have high blood sugar. You might need to change the dose of your medicine. If you are sick or exercising more than usual, you might need to change the dose of your medicine. Do not skip meals. Ask your doctor or health care professional if you should avoid alcohol. Many nonprescription cough and cold products contain sugar or alcohol. These can affect blood sugar. Pens should never be shared. Even if the needle is changed, sharing may result in passing of viruses like hepatitis or HIV. Wear a medical ID bracelet or chain, and carry a card that describes your disease and details of your medicine and dosage times. Do not become pregnant while taking this medicine. Women should inform their doctor if they wish to become pregnant or think they might be pregnant. There is a potential for serious side effects to an unborn child. Talk to your health care professional or pharmacist for more information. What side effects may I notice from receiving this medicine? Side effects  that you should report to your doctor or health care professional as soon as possible: allergic reactions like skin rash, itching or hives, swelling of the face, lips, or tongue breathing problems changes in vision diarrhea that continues or is severe lump or swelling on the neck severe nausea signs and symptoms of infection like fever or chills; cough; sore throat; pain or trouble passing urine signs and symptoms of low blood sugar such as feeling anxious, confusion, dizziness, increased hunger, unusually weak or tired,  sweating, shakiness, cold, irritable, headache, blurred vision, fast heartbeat, loss of consciousness signs and symptoms of kidney injury like trouble passing urine or change in the amount of urine trouble swallowing unusual stomach upset or pain vomiting Side effects that usually do not require medical attention (report to your doctor or health care professional if they continue or are bothersome): constipation diarrhea nausea pain, redness, or irritation at site where injected stomach upset This list may not describe all possible side effects. Call your doctor for medical advice about side effects. You may report side effects to FDA at 1-800-FDA-1088. Where should I keep my medicine? Keep out of the reach of children. Store unopened pens in a refrigerator between 2 and 8 degrees C (36 and 46 degrees F). Do not freeze. Protect from light and heat. After you first use the pen, it can be stored for 56 days at room temperature between 15 and 30 degrees C (59 and 86 degrees F) or in a refrigerator. Throw away your used pen after 56 days or after the expiration date, whichever comes first. Do not store your pen with the needle attached. If the needle is left on, medicine may leak from the pen. NOTE: This sheet is a summary. It may not cover all possible information. If you have questions about this medicine, talk to your doctor, pharmacist, or health care provider.  2021 Elsevier/Gold Standard (2018-09-09 09:41:51) This is  Dr. Lupita Dawn  example of a  "Low GI"  Diet:  It will allow you to lose 4 to 8  lbs  per month if you follow it carefully.  Your goal with exercise is a minimum of 30 minutes of aerobic exercise 5 days per week (Walking does not count once it becomes easy!)    All of the foods can be found at grocery stores and in bulk at [employer].  The Atkins protein bars and shakes are available in more varieties at Target, [employer] and [employer].     7 AM Breakfast:  Choose from the  following:  Low carbohydrate Protein  Shakes (I recommend the  Premier Protein chocolate shakes,  EAS AdvantEdge "Carb Control" shakes  Or the Atkins shakes all are under 3 net carbs)     a scrambled egg/bacon/cheese burrito made with Mission's "carb balance" whole wheat tortilla  (about 10 net carbs )  Regulatory affairs officer (basically a quiche without the pastry crust) that is eaten cold and very convenient way to get your eggs.  8 carbs)  If you make your own protein shakes, avoid bananas and pineapple,  And use low carb greek yogurt or original /unsweetened almond or soy milk    Avoid cereal and bananas, oatmeal and cream of wheat and grits. They are loaded with carbohydrates!   10 AM: high protein snack:  Protein bar by Atkins (the snack size, under 200 cal, usually < 6 net carbs).    A stick of cheese:  Around 1 carb,  100  cal     Lennie Hummer n Fit [place] Yogurt  (80 cal, 8 carbs)  Other so called "protein bars" and Greek yogurts tend to be loaded with carbohydrates.  Remember, in food advertising, the word "energy" is synonymous for " carbohydrate."  Lunch:   A Sandwich using the bread choices listed, Can use any  Eggs,  lunchmeat, grilled meat or canned tuna), avocado, regular mayo/mustard  and cheese.  A Salad using blue cheese, ranch,  Goddess or vinagrette,  Avoid taco shells, croutons or "confetti" and no "candied nuts" but regular nuts OK.   No pretzels, nabs  or chips.  Pickles and miniature sweet peppers are a good low carb alternative that provide a "crunch"  The bread is the only source of carbohydrate in a sandwich and  can be decreased by trying some of the attached alternatives to traditional loaf bread   Avoid "Low fat dressings, as well as [place] dressings They are loaded with sugar!   3 PM/ Mid day  Snack:  Consider  1 ounce of  almonds, walnuts, pistachios, pecans, peanuts,  Macadamia nuts or a nut medley.  Avoid "granola and  granola bars "  Mixed nuts are ok in moderation as long as there are no raisins,  cranberries or dried fruit.   KIND bars are OK if you get the low glycemic index variety   Try the prosciutto/mozzarella cheese sticks by Fiorruci  In deli /backery section   High protein      6 PM  Dinner:     Meat/fowl/fish with a green salad, and either broccoli, cauliflower, green beans, spinach, brussel sprouts or  Lima beans. DO NOT BREAD THE PROTEIN!!      There is a low carb pasta by Dreamfield's that is acceptable and tastes great: only 5 digestible carbs/serving.( All grocery stores but BJs carry it ) Several ready made meals are available low carb:   Try Michel Angelo's chicken piccata or chicken or eggplant parm over low carb pasta.(Lowes and BJs)   Marjory Lies Sanchez's "Carnitas" (pulled pork, no sauce,  0 carbs) or his beef pot roast to make a dinner burrito (at [employer]J's)  Pesto over low carb pasta (bj's sells a good quality pesto in the center refrigerated section of the deli   Try satueeing  Cheral Marker with mushroooms as a good side   [employer] Giant makes a mashed cauliflower that tastes like mashed potatoes  Whole wheat pasta is still full of digestible carbs and  Not as low in glycemic index as Dreamfield's.   Brown rice is still rice,  So skip the rice and noodles if you eat [place] or [place] (or at least limit to 1/2 cup)  9 PM snack :   Breyer's "low carb" fudgsicle or  ice cream bar (Carb Smart line), or  Weight Watcher's ice cream bar , or another "no sugar added" ice cream;  a serving of fresh berries/cherries with whipped cream   Cheese or DANNON'S LlGHT N FIT GREEK YOGURT  8 ounces of [place] unsweetened almond/cococunut milk    Treat yourself to a parfait made with whipped cream blueberiies, walnuts and vanilla greek yogurt  Avoid bananas, pineapple, grapes  and watermelon on a regular basis because they are high in sugar.  THINK OF THEM AS DESSERT  Remember that snack Substitutions should be  less than 10 NET carbs per serving and meals < 20 carbs. Remember to subtract fiber grams to get the "net carbs."

## 2021-07-31 ENCOUNTER — Other Ambulatory Visit: Payer: Self-pay | Admitting: Internal Medicine

## 2021-07-31 DIAGNOSIS — Z1231 Encounter for screening mammogram for malignant neoplasm of breast: Secondary | ICD-10-CM

## 2021-08-01 ENCOUNTER — Other Ambulatory Visit: Payer: Self-pay | Admitting: Family

## 2021-08-01 DIAGNOSIS — R7303 Prediabetes: Secondary | ICD-10-CM

## 2021-08-16 NOTE — Telephone Encounter (Signed)
LVM to call back to office

## 2021-08-16 NOTE — Telephone Encounter (Signed)
Pt returning call. Pt requesting callback.  °

## 2021-08-16 NOTE — Telephone Encounter (Signed)
Spoke to patient about the PA for her Ozempic and I explained to her that I would try it again and let her know as soon as I hear something

## 2021-09-06 ENCOUNTER — Ambulatory Visit: Payer: 59 | Admitting: Dermatology

## 2021-09-06 ENCOUNTER — Encounter: Payer: Self-pay | Admitting: Family

## 2021-09-06 ENCOUNTER — Telehealth (INDEPENDENT_AMBULATORY_CARE_PROVIDER_SITE_OTHER): Payer: 59 | Admitting: Family

## 2021-09-06 ENCOUNTER — Encounter: Payer: Self-pay | Admitting: Dermatology

## 2021-09-06 DIAGNOSIS — E669 Obesity, unspecified: Secondary | ICD-10-CM | POA: Diagnosis not present

## 2021-09-06 DIAGNOSIS — L820 Inflamed seborrheic keratosis: Secondary | ICD-10-CM

## 2021-09-06 DIAGNOSIS — L700 Acne vulgaris: Secondary | ICD-10-CM

## 2021-09-06 DIAGNOSIS — L821 Other seborrheic keratosis: Secondary | ICD-10-CM | POA: Diagnosis not present

## 2021-09-06 NOTE — Assessment & Plan Note (Signed)
History of prediabetes. Lab Results  Component Value Date   HGBA1C 6.4 (A) 07/26/2021   Discussed metformin v ozempic, particularly in setting of NASH. We agreed that achieving a normal BMI is of upmost importance. She would like to consider these options and let me know.

## 2021-09-06 NOTE — Patient Instructions (Signed)
Cryotherapy Aftercare  Wash gently with soap and water everyday.   Apply Vaseline and Band-Aid daily until healed.    Due to recent changes in healthcare laws, you may see results of your pathology and/or laboratory studies on MyChart before the doctors have had a chance to review them. We understand that in some cases there may be results that are confusing or concerning to you. Please understand that not all results are received at the same time and often the doctors may need to interpret multiple results in order to provide you with the best plan of care or course of treatment. Therefore, we ask that you please give [place] 2 business days to thoroughly review all your results before contacting the office for clarification. Should we see a critical lab result, you will be contacted sooner.   If You Need Anything After Your Visit  If you have any questions or concerns for your doctor, please call our main line at [phone] and press option 4 to reach your doctor's medical assistant. If no one answers, please leave a voicemail as directed and we will return your call as soon as possible. Messages left after 4 pm will be answered the following business day.   You may also send [place] a message via [place]. We typically respond to MyChart messages within 1-2 business days.  For prescription refills, please ask your pharmacy to contact our office. Our fax number is [phone].  If you have an urgent issue when the clinic is closed that cannot wait until the next business day, you can page your doctor at the number below.    Please note that while we do our best to be available for urgent issues outside of office hours, we are not available 24/7.   If you have an urgent issue and are unable to reach [place], you may choose to seek medical care at your doctor's office, retail clinic, urgent care center, or emergency room.  If you have a medical emergency, please immediately call 911 or go to the emergency  department.  Pager Numbers  - Dr. Nehemiah Massed: [phone]  - Dr. Laurence Ferrari: [phone]  - Dr. Nicole Kindred: [phone]  In the event of inclement weather, please call our main line at [phone] for an update on the status of any delays or closures.  Dermatology Medication Tips: Please keep the boxes that topical medications come in in order to help keep track of the instructions about where and how to use these. Pharmacies typically print the medication instructions only on the boxes and not directly on the medication tubes.   If your medication is too expensive, please contact our office at [phone] option 4 or send [place] a message through [place].   We are unable to tell what your co-pay for medications will be in advance as this is different depending on your insurance coverage. However, we may be able to find a substitute medication at lower cost or fill out paperwork to get insurance to cover a needed medication.   If a prior authorization is required to get your medication covered by your insurance company, please allow [place] 1-2 business days to complete this process.  Drug prices often vary depending on where the prescription is filled and some pharmacies may offer cheaper prices.  The website www.goodrx.com contains coupons for medications through different pharmacies. The prices here do not account for what the cost may be with help from insurance (it may be cheaper with your insurance), but the website can give  you the price if you did not use any insurance.  - You can print the associated coupon and take it with your prescription to the pharmacy.  - You may also stop by our office during regular business hours and pick up a GoodRx coupon card.  - If you need your prescription sent electronically to a different pharmacy, notify our office through [facility] or by phone at [phone] option 4.     Si Usted Necesita Algo Despus de Su Visita  Tambin puede enviarnos un  mensaje a travs de Pharmacist, community. Por lo general respondemos a los mensajes de MyChart en el transcurso de 1 a 2 das hbiles.  Para renovar recetas, por favor pida a su farmacia que se ponga en contacto con nuestra oficina. Harland Dingwall de fax es [place] [phone].  Si tiene un asunto urgente cuando la clnica est cerrada y que no puede esperar hasta el siguiente da hbil, puede llamar/localizar a su doctor(a) al nmero que aparece a continuacin.   Por favor, tenga en cuenta que aunque hacemos todo lo posible para estar disponibles para asuntos urgentes fuera del horario de [place], no estamos disponibles las 24 horas del da, los 7 das de la [place].   Si tiene un problema urgente y no puede comunicarse con nosotros, puede optar por buscar atencin mdica  en el consultorio de su doctor(a), en una clnica privada, en un centro de atencin urgente o en una sala de emergencias.  Si tiene Engineering geologist, por favor llame inmediatamente al 911 o vaya a la sala de emergencias.  Nmeros de bper  - Dr. Nehemiah Massed: [phone]  - Dra. Moye: [phone]  - Dra. Nicole Kindred: [phone]  En caso de inclemencias del [place], por favor llame a Johnsie Kindred principal al [phone] para una actualizacin sobre el [place] de cualquier retraso o cierre.  Consejos para la medicacin en dermatologa: Por favor, guarde las cajas en las que vienen los medicamentos de uso tpico para ayudarle a seguir las instrucciones sobre dnde y cmo usarlos. Las farmacias generalmente imprimen las instrucciones del medicamento slo en las cajas y no directamente en los tubos del [place].   Si su medicamento es muy caro, por favor, pngase en contacto con Zigmund Daniel llamando al [phone] y presione la opcin 4 o envenos un mensaje a travs de Pharmacist, community.   No podemos decirle cul ser su copago por los medicamentos por adelantado ya que esto es diferente dependiendo de la cobertura de su seguro. Sin embargo,  es posible que podamos encontrar un medicamento sustituto a Electrical engineer un formulario para que el seguro cubra el medicamento que se considera necesario.   Si se requiere una autorizacin previa para que su compaa de seguros [place] su medicamento, por favor permtanos de 1 a 2 das hbiles para completar este proceso.  Los precios de los medicamentos varan con frecuencia dependiendo del Environmental consultant de dnde se surte la receta y alguna farmacias pueden ofrecer precios ms baratos.  El sitio web www.goodrx.com tiene cupones para medicamentos de Airline pilot. Los precios aqu no tienen en cuenta lo que podra costar con la ayuda del seguro (puede ser ms barato con su seguro), pero el sitio web puede darle el precio si no utiliz Research scientist (physical sciences).  - Puede imprimir el cupn correspondiente y llevarlo con su receta a la farmacia.  - Tambin puede pasar por nuestra oficina durante el horario de atencin regular y Charity fundraiser una tarjeta de cupones de GoodRx.  - Si  necesita que su receta se enve electrnicamente a una farmacia diferente, informe a nuestra oficina a travs de MyChart de [place] o por telfono llamando al [phone] y presione la opcin 4.

## 2021-09-06 NOTE — Progress Notes (Signed)
   Follow-Up Visit   Subjective  Teresa Franco is a 61 y.o. female who presents for the following: Follow-up (ISK follow up - left waistline near groin, above left brow, neck treated with LN2 - improved but still has some that need to be treated). The patient has spots, moles and lesions to be evaluated, some may be new or changing and the patient has concerns .  The following portions of the chart were reviewed this encounter and updated as appropriate:   Tobacco  Allergies  Meds  Problems  Med Hx  Surg Hx  Fam Hx     Review of Systems:  No other skin or systemic complaints except as noted in HPI or Assessment and Plan.  Objective  Well appearing patient in no apparent distress; mood and affect are within normal limits.  A focused examination was performed including neck. Relevant physical exam findings are noted in the Assessment and Plan.  Neck (30) Erythematous stuck-on, waxy papule or plaque   Assessment & Plan   Seborrheic Keratoses - Stuck-on, waxy, tan-brown papules and/or plaques  - Benign-appearing - Discussed benign etiology and prognosis. - Observe - Call for any changes  Inflamed seborrheic keratosis (30) Neck Symptomatic, irritating, patient would like treated. Destruction of lesion - Neck Complexity: simple   Destruction method: cryotherapy   Informed consent: discussed and consent obtained   Timeout:  patient name, date of birth, surgical site, and procedure verified Lesion destroyed using liquid nitrogen: Yes   Region frozen until ice ball extended beyond lesion: Yes   Outcome: patient tolerated procedure well with no complications   Post-procedure details: wound care instructions given    Acne vulgaris Head - Anterior (Face) She is being treated by a dermatologist in [facility]  Return for 2-6 months ISK follow up.  I, Ashok Cordia, CMA, am acting as scribe for Sarina Ser, MD . Documentation: I have reviewed the above documentation for  accuracy and completeness, and I agree with the above.  Sarina Ser, MD

## 2021-09-06 NOTE — Patient Instructions (Signed)
Let me know if would rather ozempic or metformin.

## 2021-09-06 NOTE — Progress Notes (Signed)
Virtual Visit via Video Note  I connected with@  on 09/06/21 at  4:00 PM EDT by a video enabled telemedicine application and verified that I am speaking with the correct person using two identifiers.  Location patient: home Location provider:work  Persons participating in the virtual visit: patient, provider  I discussed the limitations of evaluation and management by telemedicine and the availability of in person appointments. The patient expressed understanding and agreed to proceed.   HPI: Feels well today.  Appointment to discuss weight loss medication.  She has a history of prediabetes.  Ozempic was not approved but she is considering paying out-of-pocket.  She never tried metformin History of NASH  ROS: See pertinent positives and negatives per HPI.    EXAM:  VITALS per patient if applicable: Ht 5' 8"  (1.727 m)   Wt 195 lb 6.4 oz (88.6 kg)   BMI 29.71 kg/m  BP Readings from Last 3 Encounters:  07/26/21 116/62  04/24/21 128/82  01/17/21 119/82   Wt Readings from Last 3 Encounters:  09/06/21 195 lb 6.4 oz (88.6 kg)  07/26/21 195 lb 6.4 oz (88.6 kg)  04/24/21 194 lb 9.6 oz (88.3 kg)    GENERAL: alert, oriented, appears well and in no acute distress  HEENT: atraumatic, conjunttiva clear, no obvious abnormalities on inspection of external nose and ears  NECK: normal movements of the head and neck  LUNGS: on inspection no signs of respiratory distress, breathing rate appears normal, no obvious gross SOB, gasping or wheezing  CV: no obvious cyanosis  MS: moves all visible extremities without noticeable abnormality  PSYCH/NEURO: pleasant and cooperative, no obvious depression or anxiety, speech and thought processing grossly intact  ASSESSMENT AND PLAN:  Discussed the following assessment and plan:  Problem List Items Addressed This Visit       Other   Obesity (BMI 30-39.9)    History of prediabetes. Lab Results  Component Value Date   HGBA1C 6.4 (A)  07/26/2021   Discussed metformin v ozempic, particularly in setting of NASH. We agreed that achieving a normal BMI is of upmost importance. She would like to consider these options and let me know.        -we discussed possible serious and likely etiologies, options for evaluation and workup, limitations of telemedicine visit vs in person visit, treatment, treatment risks and precautions. Pt prefers to treat via telemedicine empirically rather then risking or undertaking an in person visit at this moment.  .   I discussed the assessment and treatment plan with the patient. The patient was provided an opportunity to ask questions and all were answered. The patient agreed with the plan and demonstrated an understanding of the instructions.   The patient was advised to call back or seek an in-person evaluation if the symptoms worsen or if the condition fails to improve as anticipated.   Mable Paris, FNP

## 2021-09-07 NOTE — Telephone Encounter (Signed)
Pt seen yesterday for VV.

## 2021-09-18 ENCOUNTER — Other Ambulatory Visit: Payer: Self-pay | Admitting: Family

## 2021-09-18 DIAGNOSIS — E669 Obesity, unspecified: Secondary | ICD-10-CM

## 2021-09-18 MED ORDER — METFORMIN HCL ER 500 MG PO TB24: 500.0000 mg | ORAL_TABLET | Freq: Every evening | ORAL | 2 refills | Status: DC

## 2021-09-19 ENCOUNTER — Other Ambulatory Visit: Payer: Self-pay

## 2021-09-19 ENCOUNTER — Encounter: Payer: Self-pay | Admitting: Internal Medicine

## 2021-09-19 DIAGNOSIS — C50811 Malignant neoplasm of overlapping sites of right female breast: Secondary | ICD-10-CM

## 2021-09-27 ENCOUNTER — Other Ambulatory Visit: Payer: Self-pay | Admitting: Family

## 2021-09-27 DIAGNOSIS — E669 Obesity, unspecified: Secondary | ICD-10-CM

## 2021-09-27 MED ORDER — METFORMIN HCL ER 500 MG PO TB24: 2000.0000 mg | ORAL_TABLET | Freq: Every evening | ORAL | 3 refills | Status: DC

## 2021-10-16 ENCOUNTER — Encounter: Payer: Self-pay | Admitting: Family

## 2021-10-17 ENCOUNTER — Ambulatory Visit: Payer: 59 | Admitting: Internal Medicine

## 2021-10-17 ENCOUNTER — Other Ambulatory Visit: Payer: 59

## 2021-10-23 ENCOUNTER — Ambulatory Visit
Admission: RE | Admit: 2021-10-23 | Discharge: 2021-10-23 | Disposition: A | Discharge status: Home / Self Care | Payer: 59 | Source: Ambulatory Visit | Attending: Internal Medicine | Admitting: Internal Medicine

## 2021-10-23 DIAGNOSIS — Z1231 Encounter for screening mammogram for malignant neoplasm of breast: Secondary | ICD-10-CM | POA: Diagnosis present

## 2021-10-24 ENCOUNTER — Inpatient Hospital Stay (HOSPITAL_BASED_OUTPATIENT_CLINIC_OR_DEPARTMENT_OTHER): Payer: 59 | Admitting: Internal Medicine

## 2021-10-24 ENCOUNTER — Inpatient Hospital Stay: Payer: 59 | Attending: Internal Medicine

## 2021-10-24 ENCOUNTER — Other Ambulatory Visit: Payer: Self-pay | Admitting: *Deleted

## 2021-10-24 ENCOUNTER — Ambulatory Visit: Payer: 59 | Admitting: Family

## 2021-10-24 ENCOUNTER — Encounter: Payer: Self-pay | Admitting: Internal Medicine

## 2021-10-24 VITALS — BP 121/78 | HR 96 | Temp 98.5°F | Resp 16 | Wt 195.0 lb

## 2021-10-24 DIAGNOSIS — Z853 Personal history of malignant neoplasm of breast: Secondary | ICD-10-CM | POA: Diagnosis present

## 2021-10-24 DIAGNOSIS — Z170 Estrogen receptor positive status [ER+]: Secondary | ICD-10-CM | POA: Diagnosis not present

## 2021-10-24 DIAGNOSIS — C50811 Malignant neoplasm of overlapping sites of right female breast: Secondary | ICD-10-CM | POA: Diagnosis not present

## 2021-10-24 DIAGNOSIS — Z923 Personal history of irradiation: Secondary | ICD-10-CM | POA: Diagnosis not present

## 2021-10-24 DIAGNOSIS — Z87891 Personal history of nicotine dependence: Secondary | ICD-10-CM | POA: Insufficient documentation

## 2021-10-24 LAB — COMPREHENSIVE METABOLIC PANEL
ALT: 23 U/L (ref 0–44)
AST: 21 U/L (ref 15–41)
Albumin: 4 g/dL (ref 3.5–5.0)
Alkaline Phosphatase: 74 U/L (ref 38–126)
Anion gap: 8 (ref 5–15)
BUN: 16 mg/dL (ref 6–20)
CO2: 26 mmol/L (ref 22–32)
Calcium: 9.2 mg/dL (ref 8.9–10.3)
Chloride: 105 mmol/L (ref 98–111)
Creatinine, Ser: 0.92 mg/dL (ref 0.44–1.00)
GFR, Estimated: >60 mL/min (ref >60)
Glucose, Bld: 108 mg/dL — ABNORMAL HIGH (ref 70–99)
Potassium: 3.7 mmol/L (ref 3.5–5.1)
Sodium: 139 mmol/L (ref 135–145)
Total Bilirubin: 0.5 mg/dL (ref 0.3–1.2)
Total Protein: 7.8 g/dL (ref 6.5–8.1)

## 2021-10-24 LAB — CBC WITH DIFFERENTIAL/PLATELET
Abs Immature Granulocytes: 0.02 10*3/uL (ref 0.00–0.07)
Basophils Absolute: 0 10*3/uL (ref 0.0–0.1)
Basophils Relative: 1 %
Eosinophils Absolute: 0.2 10*3/uL (ref 0.0–0.5)
Eosinophils Relative: 3 %
HCT: 43.2 % (ref 36.0–46.0)
Hemoglobin: 13.5 g/dL (ref 12.0–15.0)
Immature Granulocytes: 0 %
Lymphocytes Relative: 24 %
Lymphs Abs: 1.8 10*3/uL (ref 0.7–4.0)
MCH: 28.5 pg (ref 26.0–34.0)
MCHC: 31.3 g/dL (ref 30.0–36.0)
MCV: 91.3 fL (ref 80.0–100.0)
Monocytes Absolute: 0.3 10*3/uL (ref 0.1–1.0)
Monocytes Relative: 5 %
Neutro Abs: 5 10*3/uL (ref 1.7–7.7)
Neutrophils Relative %: 67 %
Platelets: 245 10*3/uL (ref 150–400)
RBC: 4.73 MIL/uL (ref 3.87–5.11)
RDW: 14.1 % (ref 11.5–15.5)
WBC: 7.3 10*3/uL (ref 4.0–10.5)
nRBC: 0 % (ref 0.0–0.2)

## 2021-10-24 NOTE — Progress Notes (Signed)
Patient denies new problems/concerns today.   °

## 2021-10-24 NOTE — Assessment & Plan Note (Signed)
#  TRIPLE POSITIVE BREAST CANCER:  S/p 10 years- OCT 2023- mammo-WNL-  Continue monitoring at this time.  Stable.  Notice of recurrence.  # DM- 6.4 HbA1c [July 2023]- on metformin.   # bone density: NOV 2021-= T-score of -0.7.  Recommend ca+vit D-; BMD in dec 2023-  # Genetics testing: BRCA 1; &2-  * mychart meg re: BMD  # DISPOSITION: # follow up in 12 months;MD;  labs- cbc/cmp/ca-27-29- BIL  SCREENING mammo- prior-Dr.B

## 2021-10-24 NOTE — Progress Notes (Signed)
[facility] OFFICE PROGRESS NOTE  Patient Care Team: Burnard Hawthorne, FNP as PCP - General (Family Medicine)   Cancer Staging  No matching staging information was found for the patient.   Oncology History Overview Note  Her2/neu + right breast cancer in 07/2012.  No pathology is available. Lymph nodes were positive.  Tumor was ER/PR positive and HER-2/neu positive.    She received neoadjuvant AC followed by Taxol and Herceptin.  Lumpectomy revealed a complete pathologic response. She received radiation in 03/2003.    She received tamoxifen from 02/2003 - 02/2008. She received her Herceptin from 05/2003 - 05/2004.  She received extended adjuvant hormonal therapy with Lupron and Femara from 03/2008 - 04/2013.  She tolerated her treatment well.   Bilateral screening mammogram on 10/06/2018 revealed no evidence of malignancy.  Bilateral screening mammogram on 10/13/2019 revealed no evidence of malignancy.   CA27.29 has been followed: 15.3 on 08/14/2011, 16.2 on 02/14/2012, 16.0 on 04/14/2013, 9.2 on 11/23/2013, 14.3 on 09/22/2014, 13.3 on 10/04/2016, 9.4 on 10/03/2017, 11.5 on 10/09/2018, 8.2 on 10/30/2018, and 6.9 on 10/15/2019.   Bone density on 09/20/2011 was normal with a T-score of -0.7 of the right femoral neck.    r. 5.   Bell's palsy, resolving               Carcinoma of overlapping sites of right breast in female, estrogen receptor positive ([place])  10/17/2020 Initial Diagnosis   Carcinoma of overlapping sites of right breast in female, estrogen receptor positive ([place])    INTERVAL HISTORY: Alone.  Ambulating independently.  Teresa Franco 61 y.o.  female pleasant patient above history of triple positive breast cancer status post neoadjuvant therapy-complete pathologic response is here for follow-up.  Patient denies new problems/concerns today.    Patient denies any new lumps or bumps.  Appetite is good no weight loss.  No bone pain pain nausea vomiting.  No  chest pain or shortness of breath or cough.  No headaches.  Review of Systems  Constitutional:  Negative for chills, diaphoresis, fever, malaise/fatigue and weight loss.  HENT:  Negative for nosebleeds and sore throat.   Eyes:  Negative for double vision.  Respiratory:  Negative for cough, hemoptysis, sputum production, shortness of breath and wheezing.   Cardiovascular:  Negative for chest pain, palpitations, orthopnea and leg swelling.  Gastrointestinal:  Negative for abdominal pain, blood in stool, constipation, diarrhea, heartburn, melena, nausea and vomiting.  Genitourinary:  Negative for dysuria, frequency and urgency.  Musculoskeletal:  Negative for back pain and joint pain.  Skin: Negative.  Negative for itching and rash.  Neurological:  Negative for dizziness, tingling, focal weakness, weakness and headaches.  Endo/Heme/Allergies:  Does not bruise/bleed easily.  Psychiatric/Behavioral:  Negative for depression. The patient is not nervous/anxious and does not have insomnia.       PAST MEDICAL HISTORY :  Past Medical History:  Diagnosis Date   Breast cancer ([place]) 2004   chemo/ radiation; Her2/neu +   Dysrhythmia    palpitations   Hyperlipidemia    LGSIL on Pap smear of cervix    Personal history of chemotherapy    Personal history of radiation therapy    Prediabetes    Seasonal allergies    UTI (urinary tract infection)     PAST SURGICAL HISTORY :   Past Surgical History:  Procedure Laterality Date   BREAST BIOPSY Right 07/2002   positive   BREAST BIOPSY Right 2010   negative   BREAST  LUMPECTOMY Right 08/09/2002   positive for breast cancer   BROW LIFT Bilateral 06/25/2017   Procedure: BLEPHAROPLASTY UPPER EYELID WITH EXCESS SKIN;  Surgeon: Karle Starch, MD;  Location: [facility];  Service: Ophthalmology;  Laterality: Bilateral;   COLONOSCOPY  03/31/2010   COLONOSCOPY WITH PROPOFOL N/A 11/12/2019   Procedure: COLONOSCOPY WITH PROPOFOL;  Surgeon: Jonathon Bellows, MD;  Location[facility] END[facility]COPY;  Service: Gastroenterology;  Laterality: N/A;   LEEP  04/10/1993   OTHER SURGICAL HISTORY  06/2017   eyelid, took off excessive skin    FAMILY HISTORY :   Family History  Problem Relation Age of Onset   Hyperlipidemia Mother    Heart disease Mother    Thyroid disease Mother        no thyroid cancer   Hyperlipidemia Father    Heart disease Father 70       MI- died at 42 ? SCD   Hypertension Father    Breast cancer Maternal Aunt 48   Diabetes Maternal Aunt    Breast cancer Maternal Aunt 60   Diabetes Maternal Aunt    Colon cancer Neg Hx    Ovarian cancer Neg Hx    Melanoma Neg Hx    Thyroid cancer Neg Hx     SOCIAL HISTORY:   Social History   Tobacco Use   Smoking status: Former    Types: Cigarettes    Quit date: 01/08/1997    Years since quitting: 24.9   Smokeless tobacco: Never  Vaping Use   Vaping Use: Never used  Substance Use Topics   Alcohol use: Yes    Alcohol/week: 0.0 standard drinks of alcohol   Drug use: No    ALLERGIES:  is allergic to sulfa antibiotics.  MEDICATIONS:  Current Outpatient Medications  Medication Sig Dispense Refill   Ascorbic Acid (VITAMIN C) 1000 MG tablet Take 1,000 mg by mouth daily.     BIOTIN PO Take by mouth daily.      Cholecalciferol (VITAMIN D3 PO) Take 1 capsule by mouth daily. Take 1000 Iu by  mouth daily.     ezetimibe (ZETIA) 10 MG tablet TAKE 1 TABLET BY MOUTH EVERY DAY 30 tablet 10   MEGARED OMEGA-3 KRILL OIL PO Take by mouth daily.      metFORMIN (GLUCOPHAGE-XR) 500 MG 24 hr tablet Take 4 tablets (2,000 mg total) by mouth every evening. (Patient taking differently: Take 2,000 mg by mouth every evening. 1 tab QHS) 120 tablet 3   Milk Thistle 175 MG CAPS Take by mouth daily.     rosuvastatin (CRESTOR) 10 MG tablet Take 1 tablet (10 mg total) by mouth daily. 90 tablet 3   VIT B12-METHIONINE-INOS-CHOL IM daily.      No current facility-administered medications for this visit.     PHYSICAL EXAMINATION: ECOG PERFORMANCE STATUS: 0 - Asymptomatic  BP 121/78 (BP Location: Left Arm, Patient Position: Sitting)   Pulse 96   Temp 98.5 F (36.9 C) (Tympanic)   Resp 16   Wt 195 lb (88.5 kg)   BMI 29.65 kg/m   Filed Weights   10/24/21 1000  Weight: 195 lb (88.5 kg)    Physical Exam Vitals and nursing note reviewed.  HENT:     Head: Normocephalic and atraumatic.     Mouth/Throat:     Pharynx: Oropharynx is clear.  Eyes:     Extraocular Movements: Extraocular movements intact.     Pupils: Pupils are equal, round, and reactive to light.  Cardiovascular:  Rate and Rhythm: Normal rate and regular rhythm.  Pulmonary:     Comments: Decreased breath sounds bilaterally.  Abdominal:     Palpations: Abdomen is soft.  Musculoskeletal:        General: Normal range of motion.     Cervical back: Normal range of motion.  Skin:    General: Skin is warm.  Neurological:     General: No focal deficit present.     Mental Status: She is alert and oriented to person, place, and time.  Psychiatric:        Behavior: Behavior normal.        Judgment: Judgment normal.        LABORATORY DATA:  I have reviewed the data as listed    Component Value Date/Time   NA 139 10/24/2021 1016   NA 141 11/23/2013 1054   K 3.7 10/24/2021 1016   K 3.6 11/23/2013 1054   CL 105 10/24/2021 1016   CL 106 11/23/2013 1054   CO2 26 10/24/2021 1016   CO2 24 11/23/2013 1054   GLUCOSE 108 (H) 10/24/2021 1016   GLUCOSE 92 11/23/2013 1054   BUN 16 10/24/2021 1016   BUN 10 11/23/2013 1054   CREATININE 0.92 10/24/2021 1016   CREATININE 0.91 11/23/2013 1054   CALCIUM 9.2 10/24/2021 1016   CALCIUM 9.6 11/23/2013 1054   PROT 7.8 10/24/2021 1016   PROT 8.0 04/17/2019 1318   PROT 7.9 11/23/2013 1054   ALBUMIN 4.0 10/24/2021 1016   ALBUMIN 5.0 (H) 04/17/2019 1318   ALBUMIN 3.8 11/23/2013 1054   AST 21 10/24/2021 1016   AST 17 11/23/2013 1054   ALT 23 10/24/2021 1016   ALT 38  11/23/2013 1054   ALKPHOS 74 10/24/2021 1016   ALKPHOS 104 11/23/2013 1054   BILITOT 0.5 10/24/2021 1016   BILITOT 0.4 04/17/2019 1318   BILITOT 0.3 11/23/2013 1054   GFRNONAA >60 10/24/2021 1016   GFRNONAA >60 11/23/2013 1054   GFRNONAA 57 (L) 04/14/2013 0925   GFRAA >60 10/30/2018 1027   GFRAA >60 11/23/2013 1054   GFRAA >60 04/14/2013 0925    No results found for: "SPEP", "UPEP"  Lab Results  Component Value Date   WBC 7.3 10/24/2021   NEUTROABS 5.0 10/24/2021   HGB 13.5 10/24/2021   HCT 43.2 10/24/2021   MCV 91.3 10/24/2021   PLT 245 10/24/2021      Chemistry      Component Value Date/Time   NA 139 10/24/2021 1016   NA 141 11/23/2013 1054   K 3.7 10/24/2021 1016   K 3.6 11/23/2013 1054   CL 105 10/24/2021 1016   CL 106 11/23/2013 1054   CO2 26 10/24/2021 1016   CO2 24 11/23/2013 1054   BUN 16 10/24/2021 1016   BUN 10 11/23/2013 1054   CREATININE 0.92 10/24/2021 1016   CREATININE 0.91 11/23/2013 1054   GLU 89 01/17/2021 1552      Component Value Date/Time   CALCIUM 9.2 10/24/2021 1016   CALCIUM 9.6 11/23/2013 1054   ALKPHOS 74 10/24/2021 1016   ALKPHOS 104 11/23/2013 1054   AST 21 10/24/2021 1016   AST 17 11/23/2013 1054   ALT 23 10/24/2021 1016   ALT 38 11/23/2013 1054   BILITOT 0.5 10/24/2021 1016   BILITOT 0.4 04/17/2019 1318   BILITOT 0.3 11/23/2013 1054       RADIOGRAPHIC STUDIES: I have personally reviewed the radiological images as listed and agreed with the findings in the report. No results found.  ASSESSMENT & PLAN:  Carcinoma of overlapping sites of right breast in female, estrogen receptor positive ([place]) # TRIPLE POSITIVE BREAST CANCER:  S/p 10 years- OCT 2023- mammo-WNL-  Continue monitoring at this time.  Stable.  Notice of recurrence.  # DM- 6.4 HbA1c [July 2023]- on metformin.   # bone density: NOV 2021-= T-score of -0.7.  Recommend ca+vit D-; BMD in dec 2023-  # Genetics testing: BRCA 1; &2-   * mychart meg re: BMD  #  DISPOSITION: # follow up in 12 months;MD;  labs- cbc/cmp/ca-27-29- BIL  SCREENING mammo- prior-Dr.B   Orders Placed This Encounter  Procedures   MM 3D SCREEN BREAST BILATERAL    Standing Status:   Future    Standing Expiration Date:   10/25/2022    Order Specific Question:   Reason for Exam (SYMPTOM  OR DIAGNOSIS REQUIRED)    Answer:   screenin/hx breast cancer    Order Specific Question:   Preferred imaging location?    Answer:   [facility]    Order Specific Question:   Is the patient pregnant?    Answer:   No   CBC with Differential/Platelet    Standing Status:   Future    Standing Expiration Date:   10/25/2022   Comprehensive metabolic panel    Standing Status:   Future    Standing Expiration Date:   10/25/2022   Cancer antigen 27.29    Standing Status:   Future    Standing Expiration Date:   10/25/2022   All questions were answered. The patient knows to call the clinic with any problems, questions or concerns.      Cammie Sickle, MD 12/11/2021 6:59 PM

## 2021-10-27 LAB — CANCER ANTIGEN 27.29: CA 27.29: <9 U/mL (ref 0.0–38.6)

## 2021-12-28 ENCOUNTER — Ambulatory Visit
Admission: RE | Admit: 2021-12-28 | Discharge: 2021-12-28 | Disposition: A | Discharge status: Home / Self Care | Payer: 59 | Source: Ambulatory Visit | Attending: Internal Medicine | Admitting: Internal Medicine

## 2021-12-28 DIAGNOSIS — C50811 Malignant neoplasm of overlapping sites of right female breast: Secondary | ICD-10-CM | POA: Insufficient documentation

## 2021-12-28 DIAGNOSIS — Z170 Estrogen receptor positive status [ER+]: Secondary | ICD-10-CM | POA: Diagnosis present

## 2022-01-10 ENCOUNTER — Ambulatory Visit: Payer: 59 | Admitting: Dermatology

## 2022-02-12 ENCOUNTER — Ambulatory Visit: Payer: 59 | Admitting: Dermatology

## 2022-02-15 LAB — HM DIABETES EYE EXAM

## 2022-02-16 ENCOUNTER — Other Ambulatory Visit: Payer: Self-pay | Admitting: Family

## 2022-02-16 DIAGNOSIS — E669 Obesity, unspecified: Secondary | ICD-10-CM

## 2022-02-20 LAB — HM DIABETES EYE EXAM

## 2022-02-20 NOTE — Progress Notes (Unsigned)
abstract

## 2022-03-13 ENCOUNTER — Ambulatory Visit: Payer: 59 | Admitting: Family

## 2022-03-20 ENCOUNTER — Other Ambulatory Visit: Payer: Self-pay | Admitting: Family

## 2022-04-18 ENCOUNTER — Encounter: Payer: Self-pay | Admitting: Family

## 2022-04-18 ENCOUNTER — Ambulatory Visit: Payer: 59 | Admitting: Family

## 2022-04-18 VITALS — BP 118/76 | HR 100 | Temp 97.8°F | Ht 68.0 in | Wt 194.8 lb

## 2022-04-18 DIAGNOSIS — E663 Overweight: Secondary | ICD-10-CM | POA: Diagnosis not present

## 2022-04-18 DIAGNOSIS — R7309 Other abnormal glucose: Secondary | ICD-10-CM

## 2022-04-18 LAB — POCT GLYCOSYLATED HEMOGLOBIN (HGB A1C): Hemoglobin A1C: 6 % — AB (ref 4.0–5.6)

## 2022-04-18 NOTE — Patient Instructions (Signed)
Please download Myfitness Pal App ( free). You may log every thing you eat for even 2-3 days to get a better of idea of true daily calories. To loose weight, you have to use more calories than than consumed and essentially create caloric deficit to loose weight. The goal is 1-2 lbs per week of weight loss.   You review below from [employer] [facility].   https://www.health.[employer].edu/staying-healthy/calorie-counting-made-easy  Calorie counting made easy  Eat less, exercise more. If only it were that simple! As most dieters know, losing weight can be very challenging. As this report details, a range of influences can affect how people gain and lose weight. But a basic understanding of how to tip your energy balance in favor of weight loss is a good place to start.  Start by determining how many calories you should consume each day. To do so, you need to know how many calories you need to maintain your current weight. Doing this requires a few simple calculations.  First, multiply your current weight by 15 -- that's roughly the number of calories per pound of body weight needed to maintain your current weight if you are moderately active. Moderately active means getting at least 30 minutes of physical activity a day in the form of exercise (walking at a brisk pace, climbing stairs, or active gardening). Let's say you're a woman who is 5 feet, 4 inches tall and weighs 155 pounds, and you need to lose about 15 pounds to put you in a healthy weight range. If you multiply 155 by 15, you will get 2,325, which is the number of calories per day that you need in order to maintain your current weight (weight-maintenance calories). To lose weight, you will need to get below that total.  For example, to lose 1 to 2 pounds a week -- a rate that experts consider safe -- your food consumption should provide 500 to 1,000 calories less than your total weight-maintenance calories. If you need 2,325 calories a day to maintain  your current weight, reduce your daily calories to between 1,325 and 1,825. If you are sedentary, you will also need to build more activity into your day. In order to lose at least a pound a week, try to do at least 30 minutes of physical activity on most days, and reduce your daily calorie intake by at least 500 calories. However, calorie intake should not fall below 1,200 a day in women or 1,500 a day in men, except under the supervision of a health professional. Eating too few calories can endanger your health by depriving you of needed nutrients.  Meeting your calorie target How can you meet your daily calorie target? One approach is to add up the number of calories per serving of all the foods that you eat, and then plan your menus accordingly. You can buy books that list calories per serving for many foods. In addition, the nutrition labels on all packaged foods and beverages provide calories per serving information. Make a point of reading the labels of the foods and drinks you use, noting the number of calories and the serving sizes. Many recipes published in cookbooks, newspapers, and magazines provide similar information.  If you hate counting calories, a different approach is to restrict how much and how often you eat, and to eat meals that are low in calories. Dietary guidelines issued by the American Heart Association stress common sense in choosing your foods rather than focusing strictly on numbers, such as total calories or   calories from fat. Whichever method you choose, research shows that a regular eating schedule -- with meals and snacks planned for certain times each day -- makes for the most successful approach. The same applies after you have lost weight and want to keep it off. Sticking with an eating schedule increases your chance of maintaining your new weight.    This is  Dr. Tullo's  ( an amazing physician in my office!)  example of a  "Low GI"  Diet:  It will allow you to lose 4  to 8  lbs  per month if you follow it carefully.  Your goal with exercise is a minimum of 30 minutes of aerobic exercise 5 days per week (Walking does not count once it becomes easy!)    All of the foods can be found at grocery stores and in bulk at [employer].  The Atkins protein bars and shakes are available in more varieties at Target, [employer] and [employer].     7 AM Breakfast:  Choose from the following:  Low carbohydrate Protein  Shakes (I recommend the  Premier Protein chocolate shakes,  EAS AdvantEdge "Carb Control" shakes  Or the Atkins shakes all are under 3 net carbs)     a scrambled egg/bacon/cheese burrito made with [employer]'s "carb balance" whole wheat tortilla  (about 10 net carbs )  [employer] sells microwaveable frittata (basically a quiche without the pastry crust) that is eaten cold and very convenient way to get your eggs.  8 carbs)  If you make your own protein shakes, avoid bananas and pineapple,  And use low carb greek yogurt or original /unsweetened almond or soy milk    Avoid cereal and bananas, oatmeal and cream of wheat and grits. They are loaded with carbohydrates!   10 AM: high protein snack:  Protein bar by Atkins (the snack size, under 200 cal, usually < 6 net carbs).    A stick of cheese:  Around 1 carb,  100 cal     Dannon Light n Fit Greek Yogurt  (80 cal, 8 carbs)  Other so called "protein bars" and Greek yogurts tend to be loaded with carbohydrates.  Remember, in food advertising, the word "energy" is synonymous for " carbohydrate."  Lunch:   A Sandwich using the bread choices listed, Can use any  Eggs,  lunchmeat, grilled meat or canned tuna), avocado, regular mayo/mustard  and cheese.  A Salad using blue cheese, ranch,  Goddess or vinagrette,  Avoid taco shells, croutons or "confetti" and no "candied nuts" but regular nuts OK.   No pretzels, nabs  or chips.  Pickles and miniature sweet peppers are a good low carb alternative that provide a "crunch"  The  bread is the only source of carbohydrate in a sandwich and  can be decreased by trying some of the attached alternatives to traditional loaf bread   Avoid "Low fat dressings, as well as [place] and [place] dressings They are loaded with sugar!   3 PM/ Mid day  Snack:  Consider  1 ounce of  almonds, walnuts, pistachios, pecans, peanuts,  Macadamia nuts or a nut medley.  Avoid "granola and granola bars "  Mixed nuts are ok in moderation as long as there are no raisins,  cranberries or dried fruit.   KIND bars are OK if you get the low glycemic index variety   Try the prosciutto/mozzarella cheese sticks by [employer]  In deli /backery section   High protein        6 PM  Dinner:     Meat/fowl/fish with a green salad, and either broccoli, cauliflower, green beans, spinach, brussel sprouts or  Lima beans. DO NOT BREAD THE PROTEIN!!      There is a low carb pasta by [employer]'s that is acceptable and tastes great: only 5 digestible carbs/serving.( All grocery stores but [employer]Js carry it ) Several ready made meals are available low carb:   Try Michel Angelo's chicken piccata or chicken or eggplant parm over low carb pasta.(Lowes and BJs)   Aaron Sanchez's "Carnitas" (pulled pork, no sauce,  0 carbs) or his beef pot roast to make a dinner burrito (at [employer])  Pesto over low carb pasta (bj's sells a good quality pesto in the center refrigerated section of the deli   Try satueeing  Bok Choy with mushroooms as a good side   [employer] Giant makes a mashed cauliflower that tastes like mashed potatoes  Whole wheat pasta is still full of digestible carbs and  Not as low in glycemic index as Dreamfield's.   Brown rice is still rice,  So skip the rice and noodles if you eat Chinese or Thai (or at least limit to 1/2 cup)  9 PM snack :   Breyer's "low carb" fudgsicle or  ice cream bar (Carb Smart line), or  Weight Watcher's ice cream bar , or another "no sugar added" ice cream;  a serving of fresh  berries/cherries with whipped cream   Cheese or DANNON'S LlGHT N FIT GREEK YOGURT  8 ounces of [place] unsweetened almond/cococunut milk    Treat yourself to a parfait made with whipped cream blueberiies, walnuts and vanilla greek yogurt  Avoid bananas, pineapple, grapes  and watermelon on a regular basis because they are high in sugar.  THINK OF THEM AS DESSERT  Remember that snack Substitutions should be less than 10 NET carbs per serving and meals < 20 carbs. Remember to subtract fiber grams to get the "net carbs."   

## 2022-04-18 NOTE — Progress Notes (Signed)
Assessment & Plan:  Overweight (BMI 25.0-29.9) Assessment & Plan: Lab Results  Component Value Date   HGBA1C 6.0 (A) 04/18/2022   Pleased with decrease in A1c.  Patient had a long discussion in regards to creating caloric deficit and tracking calories to my fitness pal.  We also agreed on referral to nutrition.  We agreed to suspend metformin for the next 3 months and recheck A1c  Orders: -     Amb ref to Medical Nutrition Therapy-MNT  Elevated glucose -     POCT glycosylated hemoglobin (Hb A1C)     Return precautions given.   Risks, benefits, and alternatives of the medications and treatment plan prescribed today were discussed, and patient expressed understanding.   Education regarding symptom management and diagnosis given to patient on AVS either electronically or printed.  Return in about 3 months (around 07/18/2022).  [name], FNP  Subjective:    Patient ID: Teresa Franco, female    DOB: [birth year], 61 y.o.   MRN: [id number]  CC: Teresa Franco is a 62 y.o. female who presents today for follow up.   HPI: Feels well today.  No new complaints.  She is frustrated by difficulty losing weight.  She goes to the gym every day.  She is compliant metformin 2000 mg daily and has not needed noticed appetite suppression.  Occasional nausea    Allergies: Sulfa antibiotics Current Outpatient Medications on File Prior to Visit  Medication Sig Dispense Refill   Ascorbic Acid (VITAMIN C) 1000 MG tablet Take 1,000 mg by mouth daily.     BIOTIN PO Take by mouth daily.      Cholecalciferol (VITAMIN D3 PO) Take 1 capsule by mouth daily. Take 1000 Iu by  mouth daily.     ezetimibe (ZETIA) 10 MG tablet TAKE 1 TABLET BY MOUTH EVERY DAY 90 tablet 3   MEGARED OMEGA-3 KRILL OIL PO Take by mouth daily.      Milk Thistle 175 MG CAPS Take by mouth daily.     rosuvastatin (CRESTOR) 10 MG tablet Take 1 tablet (10 mg total) by mouth daily. 90 tablet 3   VIT B12-METHIONINE-INOS-CHOL IM  daily.      No current facility-administered medications on file prior to visit.    Review of Systems  Constitutional:  Negative for chills and fever.  Respiratory:  Negative for cough.   Cardiovascular:  Negative for chest pain and palpitations.  Gastrointestinal:  Negative for nausea and vomiting.      Objective:    BP 118/76   Pulse 100   Temp 97.8 F (36.6 C) (Oral)   Ht 5\' 8"  (1.727 m)   Wt 194 lb 12.8 oz (88.4 kg)   SpO2 97%   BMI 29.62 kg/m  BP Readings from Last 3 Encounters:  04/18/22 118/76  10/24/21 121/78  07/26/21 116/62   Wt Readings from Last 3 Encounters:  04/18/22 194 lb 12.8 oz (88.4 kg)  10/24/21 195 lb (88.5 kg)  09/06/21 195 lb 6.4 oz (88.6 kg)    Physical Exam Vitals reviewed.  Constitutional:      Appearance: She is well-developed.  Eyes:     Conjunctiva/sclera: Conjunctivae normal.  Cardiovascular:     Rate and Rhythm: Normal rate and regular rhythm.     Pulses: Normal pulses.     Heart sounds: Normal heart sounds.  Pulmonary:     Effort: Pulmonary effort is normal.     Breath sounds: Normal breath sounds. No wheezing, rhonchi or  rales.  Skin:    General: Skin is warm and dry.  Neurological:     Mental Status: She is alert.  Psychiatric:        Speech: Speech normal.        Behavior: Behavior normal.        Thought Content: Thought content normal.

## 2022-04-18 NOTE — Assessment & Plan Note (Signed)
Lab Results  Component Value Date   HGBA1C 6.0 (A) 04/18/2022   Pleased with decrease in A1c.  Patient had a long discussion in regards to creating caloric deficit and tracking calories to my fitness pal.  We also agreed on referral to nutrition.  We agreed to suspend metformin for the next 3 months and recheck A1c

## 2022-04-20 ENCOUNTER — Other Ambulatory Visit: Payer: Self-pay | Admitting: Family

## 2022-05-15 ENCOUNTER — Other Ambulatory Visit: Payer: Self-pay | Admitting: Internal Medicine

## 2022-05-15 DIAGNOSIS — Z1231 Encounter for screening mammogram for malignant neoplasm of breast: Secondary | ICD-10-CM

## 2022-07-18 ENCOUNTER — Ambulatory Visit: Payer: 59 | Admitting: Family

## 2022-07-25 ENCOUNTER — Encounter: Payer: BC Managed Care – PPO | Attending: Family | Admitting: Dietician

## 2022-07-25 ENCOUNTER — Encounter: Payer: Self-pay | Admitting: Dietician

## 2022-07-25 VITALS — Ht 68.0 in | Wt 198.9 lb

## 2022-07-25 DIAGNOSIS — E663 Overweight: Secondary | ICD-10-CM | POA: Diagnosis not present

## 2022-07-25 DIAGNOSIS — E782 Mixed hyperlipidemia: Secondary | ICD-10-CM

## 2022-07-25 DIAGNOSIS — K7581 Nonalcoholic steatohepatitis (NASH): Secondary | ICD-10-CM

## 2022-07-25 DIAGNOSIS — Z6830 Body mass index (BMI) 30.0-30.9, adult: Secondary | ICD-10-CM | POA: Diagnosis not present

## 2022-07-25 NOTE — Progress Notes (Signed)
Medical Nutrition Therapy: Visit start time: 1320  end time: 1420  Assessment:   Referral Diagnosis: overweight Other medical history/ diagnoses: NASH, HLD, history of breast cancer Diabetes/ prediabetes Psychosocial issues/ stress concerns: none  Medications, supplements: reconciled list in medical record   Preferred learning method:  Auditory Visual Hands-on    Current weight: 198.9lbs Height: 5'8" BMI: 30.24 Patient's personal weight goal:  Progress and evaluation:  Patient reports gradual weight gain over years time.  She has not tried any specific diets or programs in the past.   She reports dislike of cooking, so meals are simple with minimal cooking/ preparation. Daughter is home for the summer and they have been eating more healthy home-cooked meals together. Recent lab results indicate: HbA1C 6.4% 04/18/22 History of total cholesterol at 273, currently controlled with ezetimibe Food allergies: none Special diet practices: none Patient seeks help with weight loss with challenge of preparing meals for one.  Next PCP appt is 09/04/22   Dietary Intake:  Usual eating pattern includes 1-2 meals and 2 snacks per day. Dining out frequency: 0 meals per week. Who plans meals/ buys groceries? self Who prepares meals? self  Breakfast:  Snack: none Lunch: 11am late breakfast or early lunch -- bacon, eggs, eng muffin; salad with protein ie chicken or [place] Snack: popcorn, other similar snacks Supper: often more snacking than eating a meal ie popcorn Snack: none Beverages: mostly water no other drinks typically  Physical activity: cardio and weigh training 60 minutes, 5x a week for the past year. No weight loss but has lost inches   Intervention:   Nutrition Care Education:   Basic nutrition: basic food groups; appropriate nutrient balance; appropriate meal and snack schedule; general nutrition guidelines    Weight control: importance of low sugar and low fat choices;  appropriate food portions estimated energy needs for weight loss at 1400-1500 kcal, provided guidance for 45% CHO, 25% pro, with low fat choices; role of physical activity and importance of fueling exercise adequately Advanced nutrition:  simple cooking techniques ie batch cooking and freezing; options for balanced meals requiring little cooking; partial homemade meals; food label reading for carbohydrate Other: vegetarian protein sources, food sources of Vitamin B12  Other intervention notes: Patient has been working to make healthy food and beverage choices, and she is motivated to continue. Established goals for change with direction from patient.   Nutritional Diagnosis:  [place]-2.1 Inpaired nutrition utilization As related to NASH, hyperlipidemia.  As evidenced by NASH diagnosis based on imaging and liver enzyme tests; blood lipids controlled with medication. [place]-3.3 Overweight/obesity As related to hormonal changes, history of excess calories, history of inadequate physical activity.  As evidenced by patient with current BMI of 30.24.   Education Materials given:  Designer, industrial/product with food lists, sample meal pattern Sample menus Snacking handout Vegetarian Proteins list Visit summary with goals/ instructions   Learner/ who was taught:  Patient   Level of understanding: Verbalizes/ demonstrates competency  Demonstrated degree of understanding via:   Teach back Learning barriers: None  Willingness to learn/ readiness for change: Eager, change in progress  Monitoring and Evaluation:  Dietary intake, exercise,  and body weight      follow up:  09/26/22 at 1:15pm

## 2022-07-25 NOTE — Patient Instructions (Signed)
Plan to eat something every 3-5 hours during the day. It's OK to have light meals/ balanced healthy snacks in place of cooked meals. Some options: yogurt with small amount of whole grain cereal and fruit; English muffin or mini flatbread pizza and salad Great job including regular exercise, keep it up! Prepare extra meat or veggies when cooking and freeze portions individually for later use.

## 2022-08-08 ENCOUNTER — Encounter (INDEPENDENT_AMBULATORY_CARE_PROVIDER_SITE_OTHER): Payer: Self-pay

## 2022-08-09 DIAGNOSIS — G5133 Clonic hemifacial spasm, bilateral: Secondary | ICD-10-CM | POA: Diagnosis not present

## 2022-08-09 DIAGNOSIS — G510 Bell's palsy: Secondary | ICD-10-CM | POA: Diagnosis not present

## 2022-08-09 DIAGNOSIS — G245 Blepharospasm: Secondary | ICD-10-CM | POA: Diagnosis not present

## 2022-08-14 DIAGNOSIS — H31101 Choroidal degeneration, unspecified, right eye: Secondary | ICD-10-CM | POA: Diagnosis not present

## 2022-08-14 DIAGNOSIS — H35372 Puckering of macula, left eye: Secondary | ICD-10-CM | POA: Diagnosis not present

## 2022-08-14 DIAGNOSIS — H04122 Dry eye syndrome of left lacrimal gland: Secondary | ICD-10-CM | POA: Diagnosis not present

## 2022-08-14 DIAGNOSIS — H43813 Vitreous degeneration, bilateral: Secondary | ICD-10-CM | POA: Diagnosis not present

## 2022-08-23 ENCOUNTER — Encounter (INDEPENDENT_AMBULATORY_CARE_PROVIDER_SITE_OTHER): Payer: Self-pay

## 2022-08-28 ENCOUNTER — Ambulatory Visit: Payer: BC Managed Care – PPO | Admitting: Obstetrics and Gynecology

## 2022-09-04 ENCOUNTER — Ambulatory Visit: Payer: 59 | Admitting: Family

## 2022-09-17 ENCOUNTER — Ambulatory Visit: Payer: 59 | Admitting: Family

## 2022-09-26 ENCOUNTER — Ambulatory Visit: Payer: BC Managed Care – PPO | Admitting: Dietician

## 2022-10-15 NOTE — Progress Notes (Unsigned)
c  PCP: Allegra Grana, FNP   No chief complaint on file.   HPI:      [name] is a 62 y.o. G2P1011 who LMP was No LMP recorded. Patient is postmenopausal., presents today for her annual examination.  Her menses are absent and she is postmenopausal. She does not have PMB. She does have occas vasomotor sx.   Sex activity: single partner, contraception - post menopausal status. She does not have vaginal dryness.  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 Bonney Aid. Repeat pap smear in 1 yr.  10/13/19 Results were normal/neg HPV DNA.  03/24/18  ASCUS with neg HPV DNA, same result 2018.  November 09, 2015  Results were: low-grade squamous intraepithelial neoplasia (LGSIL - encompassing HPV,mild dysplasia,CIN I) /neg HPV DNA. Colpo 12/05/15 with Dr. Bonney Aid. 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  (Per Dr. Bonney Aid, pts with recurrent LGSIL and neg colpo/bx may be better suited for LEEP.) Hx of STDs: HPV  Last mammogram: 10/13/20 with oncology; Results were: normal--routine follow-up in 12 months.  Pt is s/p RT breast cancer, her2/neu+ in 2004 (age 89). Had lumpectomy, chemo, rad, 5 yrs of tamoxifen, and 5 yrs of letrozole. Not on any tx currently. Followed by onc yearly.   There is a FH of breast cancer in 2 mat grt aunts. Pt did BRCA genetic testing through [facility] a few yrs ago. Panel testing done. There is no FH of ovarian cancer. The patient does do self-breast exams. She takes Vit D supp.  Colonoscopy: colonoscopy 11/21 with Dr. Tobi Bastos without abnormalities. Repeat due after 10 years per pt.   DEXA: 11/21 and 12/23 at [facility]:  normal spine and hip  Tobacco use: The patient denies current or previous tobacco use. Alcohol use: none  No drug use Exercise: mod active  She does get adequate calcium and Vitamin D in her diet.  Labs with PCP.  Past Medical History:  Diagnosis Date   Breast cancer (HCC) 2004   chemo/ radiation; Her2/neu +   Dysrhythmia     palpitations   Hyperlipidemia    LGSIL on Pap smear of cervix    Personal history of chemotherapy    Personal history of radiation therapy    Prediabetes    Seasonal allergies    UTI (urinary tract infection)     Past Surgical History:  Procedure Laterality Date   BREAST BIOPSY Right 07/2002   positive   BREAST BIOPSY Right 2010   negative   BREAST LUMPECTOMY Right 08/09/2002   positive for breast cancer   BROW LIFT Bilateral 06/25/2017   Procedure: BLEPHAROPLASTY UPPER EYELID WITH EXCESS SKIN;  Surgeon: Imagene Riches, MD;  Location: [facility];  Service: Ophthalmology;  Laterality: Bilateral;   COLONOSCOPY  03/31/2010   COLONOSCOPY WITH PROPOFOL N/A 11/12/2019   Procedure: COLONOSCOPY WITH PROPOFOL;  Surgeon: Wyline Mood, MD;  Location: [facility];  Service: Gastroenterology;  Laterality: N/A;   LEEP  04/10/1993   OTHER SURGICAL HISTORY  06/2017   eyelid, took off excessive skin    Family History  Problem Relation Age of Onset   Hyperlipidemia Mother    Heart disease Mother    Thyroid disease Mother        no thyroid cancer   Hyperlipidemia Father    Heart disease Father 22       MI- died at 26 ? SCD   Hypertension Father    Breast cancer Maternal Aunt 8  Diabetes Maternal Aunt    Breast cancer Maternal Aunt 60   Diabetes Maternal Aunt    Colon cancer Neg Hx    Ovarian cancer Neg Hx    Melanoma Neg Hx    Thyroid cancer Neg Hx     Social History   Socioeconomic History   Marital status: Married    Spouse name: Not on file   Number of children: Not on file   Years of education: Not on file   Highest education level: Not on file  Occupational History   Not on file  Tobacco Use   Smoking status: Former    Current packs/day: 0.00    Types: Cigarettes    Quit date: 01/08/1997    Years since quitting: 25.7   Smokeless tobacco: Never  Vaping Use   Vaping status: Never Used  Substance and Sexual Activity   Alcohol use: Not Currently    Comment:  sometimes on holidays   Drug use: No   Sexual activity: Yes    Birth control/protection: Post-menopausal  Other Topics Concern   Not on file  Social History Narrative   From    Works for telecommunication    1 daughter            Social Determinants of Health   Financial Resource Strain: Not on file  Food Insecurity: Not on file  Transportation Needs: Not on file  Physical Activity: Not on file  Stress: Not on file  Social Connections: Not on file  Intimate Partner Violence: Not on file    No outpatient medications have been marked as taking for the 10/16/22 encounter (Appointment) with Darden Flemister B, PA-C.      ROS:  Review of Systems  Constitutional:  Negative for fatigue, fever and unexpected weight change.  Respiratory:  Negative for cough, shortness of breath and wheezing.   Cardiovascular:  Negative for chest pain, palpitations and leg swelling.  Gastrointestinal:  Negative for blood in stool, constipation, diarrhea, nausea and vomiting.  Endocrine: Negative for cold intolerance, heat intolerance and polyuria.  Genitourinary:  Negative for dyspareunia, dysuria, flank pain, frequency, genital sores, hematuria, menstrual problem, pelvic pain, urgency, vaginal bleeding, vaginal discharge and vaginal pain.  Musculoskeletal:  Negative for back pain, joint swelling and myalgias.  Skin:  Negative for rash.  Neurological:  Negative for dizziness, syncope, light-headedness, numbness and headaches.  Hematological:  Negative for adenopathy.  Psychiatric/Behavioral:  Negative for agitation, confusion, sleep disturbance and suicidal ideas. The patient is not nervous/anxious.      Objective: There were no vitals taken for this visit.   Physical Exam Constitutional:      Appearance: She is well-developed.  Genitourinary:     Vulva normal.     Right Labia: No rash, tenderness or lesions.    Left Labia: No tenderness, lesions or rash.    No vaginal  discharge, erythema or tenderness.      Right Adnexa: not tender and no mass present.    Left Adnexa: not tender and no mass present.    No cervical motion tenderness, friability or polyp.     Uterus is not enlarged or tender.  Breasts:    Right: No mass, nipple discharge, skin change or tenderness.     Left: No mass, nipple discharge, skin change or tenderness.  Neck:     Thyroid: No thyromegaly.  Cardiovascular:     Rate and Rhythm: Normal rate and regular rhythm.     Heart sounds: Normal heart sounds. No  murmur heard. Pulmonary:     Effort: Pulmonary effort is normal.     Breath sounds: Normal breath sounds.  Abdominal:     Palpations: Abdomen is soft.     Tenderness: There is no abdominal tenderness. There is no guarding or rebound.  Musculoskeletal:        General: Normal range of motion.     Cervical back: Normal range of motion.  Lymphadenopathy:     Cervical: No cervical adenopathy.  Neurological:     General: No focal deficit present.     Mental Status: She is alert and oriented to person, place, and time.     Cranial Nerves: No cranial nerve deficit.  Skin:    General: Skin is warm and dry.  Psychiatric:        Mood and Affect: Mood normal.        Behavior: Behavior normal.        Thought Content: Thought content normal.        Judgment: Judgment normal.  Vitals reviewed.     Assessment/Plan: Encounter for annual routine gynecological examination  Cervical cancer screening - Plan: Cytology - PAP  Cervical dysplasia - Plan: Cytology - PAP; repeat pap today.  Encounter for screening mammogram for malignant neoplasm of breast; pt current on mammo, followed by oncology  History of right breast cancer--doing well.         GYN counsel mammography screening, adequate intake of calcium and vitamin D, diet and exercise    F/U  No follow-ups on file.  Elowyn Raupp B. Mikiah Demond, PA-C 10/15/2022 4:44 PM

## 2022-10-16 ENCOUNTER — Other Ambulatory Visit (HOSPITAL_COMMUNITY)
Admission: RE | Admit: 2022-10-16 | Discharge: 2022-10-16 | Disposition: A | Discharge status: Home / Self Care | Payer: BC Managed Care – PPO | Source: Ambulatory Visit | Attending: Obstetrics and Gynecology | Admitting: Obstetrics and Gynecology

## 2022-10-16 ENCOUNTER — Encounter: Payer: Self-pay | Admitting: Obstetrics and Gynecology

## 2022-10-16 ENCOUNTER — Ambulatory Visit: Payer: BC Managed Care – PPO | Admitting: Obstetrics and Gynecology

## 2022-10-16 VITALS — BP 135/83 | HR 101 | Ht 68.0 in | Wt 202.0 lb

## 2022-10-16 DIAGNOSIS — Z1231 Encounter for screening mammogram for malignant neoplasm of breast: Secondary | ICD-10-CM

## 2022-10-16 DIAGNOSIS — N870 Mild cervical dysplasia: Secondary | ICD-10-CM | POA: Diagnosis not present

## 2022-10-16 DIAGNOSIS — Z853 Personal history of malignant neoplasm of breast: Secondary | ICD-10-CM

## 2022-10-16 DIAGNOSIS — Z124 Encounter for screening for malignant neoplasm of cervix: Secondary | ICD-10-CM | POA: Insufficient documentation

## 2022-10-16 DIAGNOSIS — Z1151 Encounter for screening for human papillomavirus (HPV): Secondary | ICD-10-CM

## 2022-10-16 DIAGNOSIS — Z01419 Encounter for gynecological examination (general) (routine) without abnormal findings: Secondary | ICD-10-CM | POA: Diagnosis not present

## 2022-10-16 NOTE — Patient Instructions (Signed)
I value your feedback and you entrusting us with your care. If you get a [facility] patient survey, I would appreciate you taking the time to let us know about your experience today. Thank you! ? ? ?

## 2022-10-22 LAB — CYTOLOGY - PAP
Comment: NEGATIVE
High risk HPV: NEGATIVE

## 2022-10-24 ENCOUNTER — Telehealth: Payer: Self-pay

## 2022-10-24 ENCOUNTER — Ambulatory Visit: Payer: BC Managed Care – PPO | Admitting: Family

## 2022-10-24 NOTE — Telephone Encounter (Signed)
Reminder letter mailed to the patient.

## 2022-10-25 ENCOUNTER — Encounter: Payer: Self-pay | Admitting: Internal Medicine

## 2022-10-25 ENCOUNTER — Other Ambulatory Visit: Payer: 59

## 2022-10-25 ENCOUNTER — Ambulatory Visit: Payer: 59 | Admitting: Internal Medicine

## 2022-10-25 ENCOUNTER — Ambulatory Visit
Admission: RE | Admit: 2022-10-25 | Discharge: 2022-10-25 | Disposition: A | Discharge status: Home / Self Care | Payer: BC Managed Care – PPO | Source: Ambulatory Visit | Attending: Internal Medicine | Admitting: Internal Medicine

## 2022-10-25 ENCOUNTER — Inpatient Hospital Stay: Payer: BC Managed Care – PPO | Attending: Internal Medicine | Admitting: Internal Medicine

## 2022-10-25 ENCOUNTER — Inpatient Hospital Stay: Payer: BC Managed Care – PPO

## 2022-10-25 VITALS — BP 135/84 | HR 105 | Temp 98.2°F | Resp 17 | Wt 204.2 lb

## 2022-10-25 DIAGNOSIS — Z87891 Personal history of nicotine dependence: Secondary | ICD-10-CM | POA: Diagnosis not present

## 2022-10-25 DIAGNOSIS — C50811 Malignant neoplasm of overlapping sites of right female breast: Secondary | ICD-10-CM | POA: Diagnosis not present

## 2022-10-25 DIAGNOSIS — Z9221 Personal history of antineoplastic chemotherapy: Secondary | ICD-10-CM | POA: Insufficient documentation

## 2022-10-25 DIAGNOSIS — Z170 Estrogen receptor positive status [ER+]: Secondary | ICD-10-CM | POA: Diagnosis not present

## 2022-10-25 DIAGNOSIS — Z923 Personal history of irradiation: Secondary | ICD-10-CM | POA: Insufficient documentation

## 2022-10-25 DIAGNOSIS — Z1231 Encounter for screening mammogram for malignant neoplasm of breast: Secondary | ICD-10-CM | POA: Diagnosis not present

## 2022-10-25 LAB — COMPREHENSIVE METABOLIC PANEL
ALT: 29 U/L (ref 0–44)
AST: 23 U/L (ref 15–41)
Albumin: 4.4 g/dL (ref 3.5–5.0)
Alkaline Phosphatase: 65 U/L (ref 38–126)
Anion gap: 8 (ref 5–15)
BUN: 19 mg/dL (ref 8–23)
CO2: 25 mmol/L (ref 22–32)
Calcium: 9.5 mg/dL (ref 8.9–10.3)
Chloride: 104 mmol/L (ref 98–111)
Creatinine, Ser: 0.83 mg/dL (ref 0.44–1.00)
GFR, Estimated: >60 mL/min (ref >60)
Glucose, Bld: 105 mg/dL — ABNORMAL HIGH (ref 70–99)
Potassium: 4.1 mmol/L (ref 3.5–5.1)
Sodium: 137 mmol/L (ref 135–145)
Total Bilirubin: 0.5 mg/dL (ref 0.3–1.2)
Total Protein: 8.2 g/dL — ABNORMAL HIGH (ref 6.5–8.1)

## 2022-10-25 LAB — CBC WITH DIFFERENTIAL/PLATELET
Abs Immature Granulocytes: 0.03 10*3/uL (ref 0.00–0.07)
Basophils Absolute: 0.1 10*3/uL (ref 0.0–0.1)
Basophils Relative: 1 %
Eosinophils Absolute: 0.1 10*3/uL (ref 0.0–0.5)
Eosinophils Relative: 1 %
HCT: 43.2 % (ref 36.0–46.0)
Hemoglobin: 14 g/dL (ref 12.0–15.0)
Immature Granulocytes: 0 %
Lymphocytes Relative: 21 %
Lymphs Abs: 1.7 10*3/uL (ref 0.7–4.0)
MCH: 29.2 pg (ref 26.0–34.0)
MCHC: 32.4 g/dL (ref 30.0–36.0)
MCV: 90 fL (ref 80.0–100.0)
Monocytes Absolute: 0.4 10*3/uL (ref 0.1–1.0)
Monocytes Relative: 5 %
Neutro Abs: 5.8 10*3/uL (ref 1.7–7.7)
Neutrophils Relative %: 72 %
Platelets: 262 10*3/uL (ref 150–400)
RBC: 4.8 MIL/uL (ref 3.87–5.11)
RDW: 13.5 % (ref 11.5–15.5)
WBC: 8.1 10*3/uL (ref 4.0–10.5)
nRBC: 0 % (ref 0.0–0.2)

## 2022-10-25 NOTE — Progress Notes (Signed)
Doing well. Had a mammogram this morning. No concerns.

## 2022-10-25 NOTE — Assessment & Plan Note (Addendum)
#   TRIPLE POSITIVE BREAST CANCER:  S/p 10 years- OCT 2024- mammo-pending-   Continue monitoring at this time.  Stable.  No evidence of recurrence.  # DM-  [July 2023]-pre-diabetic level- on metformin.   # bone density: NOV 2021-= T-score of -0.7.  Recommend ca+vit D-; BMD in dec 2023-  T-score: of -0.8- STABLE.   # Genetics testing: BRCA 1; &2-NEGATIVE.    # DISPOSITION: # follow up in 12 months;MD;  labs- cbc/cmp/ca-27-29- BIL  SCREENING mammo- prior-Dr.B

## 2022-10-25 NOTE — Progress Notes (Signed)
[facility] OFFICE PROGRESS NOTE  Patient Care Team: Allegra Grana, FNP as PCP - General (Family Medicine) Earna Coder, MD as Consulting Physician (Oncology)   Cancer Staging  No matching staging information was found for the patient.    Oncology History Overview Note  Her2/neu + right breast cancer in 07/2012.  No pathology is available. Lymph nodes were positive.  Tumor was ER/PR positive and HER-2/neu positive.    She received neoadjuvant AC followed by Taxol and Herceptin.  Lumpectomy revealed a complete pathologic response. She received radiation in 03/2003.    She received tamoxifen from 02/2003 - 02/2008. She received her Herceptin from 05/2003 - 05/2004.  She received extended adjuvant hormonal therapy with Lupron and Femara from 03/2008 - 04/2013.  She tolerated her treatment well.   Bilateral screening mammogram on 10/06/2018 revealed no evidence of malignancy.  Bilateral screening mammogram on 10/13/2019 revealed no evidence of malignancy.   CA27.29 has been followed: 15.3 on 08/14/2011, 16.2 on 02/14/2012, 16.0 on 04/14/2013, 9.2 on 11/23/2013, 14.3 on 09/22/2014, 13.3 on 10/04/2016, 9.4 on 10/03/2017, 11.5 on 10/09/2018, 8.2 on 10/30/2018, and 6.9 on 10/15/2019.   Bone density on 09/20/2011 was normal with a T-score of -0.7 of the right femoral neck.    r. 5.   Bell's palsy, resolving               Carcinoma of overlapping sites of right breast in female, estrogen receptor positive (HCC)  10/17/2020 Initial Diagnosis   Carcinoma of overlapping sites of right breast in female, estrogen receptor positive (HCC)    INTERVAL HISTORY: Alone.  Ambulating independently.  Ronnette Hila 63 y.o.  female pleasant patient above history of triple positive breast cancer status post neoadjuvant therapy-complete pathologic response is here for follow-up.  Patient denies new problems/concerns today.    Patient denies any new lumps or bumps.  Appetite  is good no weight loss.  No bone pain pain nausea vomiting.  No chest pain or shortness of breath or cough.  No headaches.  Review of Systems  Constitutional:  Negative for chills, diaphoresis, fever, malaise/fatigue and weight loss.  HENT:  Negative for nosebleeds and sore throat.   Eyes:  Negative for double vision.  Respiratory:  Negative for cough, hemoptysis, sputum production, shortness of breath and wheezing.   Cardiovascular:  Negative for chest pain, palpitations, orthopnea and leg swelling.  Gastrointestinal:  Negative for abdominal pain, blood in stool, constipation, diarrhea, heartburn, melena, nausea and vomiting.  Genitourinary:  Negative for dysuria, frequency and urgency.  Musculoskeletal:  Negative for back pain and joint pain.  Skin: Negative.  Negative for itching and rash.  Neurological:  Negative for dizziness, tingling, focal weakness, weakness and headaches.  Endo/Heme/Allergies:  Does not bruise/bleed easily.  Psychiatric/Behavioral:  Negative for depression. The patient is not nervous/anxious and does not have insomnia.       PAST MEDICAL HISTORY :  Past Medical History:  Diagnosis Date   Breast cancer (HCC) 2004   chemo/ radiation; Her2/neu +   Dysrhythmia    palpitations   Hyperlipidemia    LGSIL on Pap smear of cervix    Personal history of chemotherapy    Personal history of radiation therapy    Prediabetes    Seasonal allergies    UTI (urinary tract infection)     PAST SURGICAL HISTORY :   Past Surgical History:  Procedure Laterality Date   BREAST BIOPSY Right 07/2002   positive   BREAST  BIOPSY Right 2010   negative   BREAST LUMPECTOMY Right 08/09/2002   positive for breast cancer   BROW LIFT Bilateral 06/25/2017   Procedure: BLEPHAROPLASTY UPPER EYELID WITH EXCESS SKIN;  Surgeon: Imagene Riches, MD;  Location: [facility];  Service: Ophthalmology;  Laterality: Bilateral;   COLONOSCOPY  03/31/2010   COLONOSCOPY WITH PROPOFOL N/A  11/12/2019   Procedure: COLONOSCOPY WITH PROPOFOL;  Surgeon: Wyline Mood, MD;  Location: [facility] ENDOSCOPY;  Service: Gastroenterology;  Laterality: N/A;   LEEP  04/10/1993   OTHER SURGICAL HISTORY  06/2017   eyelid, took off excessive skin    FAMILY HISTORY :   Family History  Problem Relation Age of Onset   Hyperlipidemia Mother    Heart disease Mother    Thyroid disease Mother        no thyroid cancer   Hyperlipidemia Father    Heart disease Father 25       MI- died at 61 ? SCD   Hypertension Father    Breast cancer Maternal Aunt 50   Diabetes Maternal Aunt    Breast cancer Maternal Aunt 60   Diabetes Maternal Aunt    Colon cancer Neg Hx    Ovarian cancer Neg Hx    Melanoma Neg Hx    Thyroid cancer Neg Hx     SOCIAL HISTORY:   Social History   Tobacco Use   Smoking status: Former    Current packs/day: 0.00    Types: Cigarettes    Quit date: 01/08/1997    Years since quitting: 25.8   Smokeless tobacco: Never  Vaping Use   Vaping status: Never Used  Substance Use Topics   Alcohol use: Yes    Comment: sometimes on holidays   Drug use: No    ALLERGIES:  is allergic to sulfa antibiotics.  MEDICATIONS:  Current Outpatient Medications  Medication Sig Dispense Refill   Ascorbic Acid (VITAMIN C) 1000 MG tablet Take 1,000 mg by mouth daily.     BIOTIN PO Take by mouth daily.      Cholecalciferol (VITAMIN D3 PO) Take 1 capsule by mouth daily. Take 1000 Iu by  mouth daily.     ezetimibe (ZETIA) 10 MG tablet TAKE 1 TABLET BY MOUTH EVERY DAY 90 tablet 3   MAGNESIUM CITRATE PO Take by mouth.     MEGARED OMEGA-3 KRILL OIL PO Take by mouth daily.      Milk Thistle 175 MG CAPS Take by mouth daily.     rosuvastatin (CRESTOR) 10 MG tablet TAKE 1 TABLET BY MOUTH EVERY DAY 30 tablet 11   VIT B12-METHIONINE-INOS-CHOL IM daily.      No current facility-administered medications for this visit.    PHYSICAL EXAMINATION: ECOG PERFORMANCE STATUS: 0 - Asymptomatic  BP 135/84 (BP  Location: Left Arm, Patient Position: Sitting)   Pulse (!) 105   Temp 98.2 F (36.8 C) (Tympanic)   Resp 17   Wt 204 lb 3.2 oz (92.6 kg)   SpO2 100%   BMI 31.05 kg/m   Filed Weights   10/25/22 1123  Weight: 204 lb 3.2 oz (92.6 kg)     Physical Exam Vitals and nursing note reviewed.  HENT:     Head: Normocephalic and atraumatic.     Mouth/Throat:     Pharynx: Oropharynx is clear.  Eyes:     Extraocular Movements: Extraocular movements intact.     Pupils: Pupils are equal, round, and reactive to light.  Cardiovascular:     Rate  and Rhythm: Normal rate and regular rhythm.  Pulmonary:     Comments: Decreased breath sounds bilaterally.  Abdominal:     Palpations: Abdomen is soft.  Musculoskeletal:        General: Normal range of motion.     Cervical back: Normal range of motion.  Skin:    General: Skin is warm.  Neurological:     General: No focal deficit present.     Mental Status: She is alert and oriented to person, place, and time.  Psychiatric:        Behavior: Behavior normal.        Judgment: Judgment normal.        LABORATORY DATA:  I have reviewed the data as listed    Component Value Date/Time   NA 137 10/25/2022 1054   NA 141 11/23/2013 1054   K 4.1 10/25/2022 1054   K 3.6 11/23/2013 1054   CL 104 10/25/2022 1054   CL 106 11/23/2013 1054   CO2 25 10/25/2022 1054   CO2 24 11/23/2013 1054   GLUCOSE 105 (H) 10/25/2022 1054   GLUCOSE 92 11/23/2013 1054   BUN 19 10/25/2022 1054   BUN 10 11/23/2013 1054   CREATININE 0.83 10/25/2022 1054   CREATININE 0.91 11/23/2013 1054   CALCIUM 9.5 10/25/2022 1054   CALCIUM 9.6 11/23/2013 1054   PROT 8.2 (H) 10/25/2022 1054   PROT 8.0 04/17/2019 1318   PROT 7.9 11/23/2013 1054   ALBUMIN 4.4 10/25/2022 1054   ALBUMIN 5.0 (H) 04/17/2019 1318   ALBUMIN 3.8 11/23/2013 1054   AST 23 10/25/2022 1054   AST 17 11/23/2013 1054   ALT 29 10/25/2022 1054   ALT 38 11/23/2013 1054   ALKPHOS 65 10/25/2022 1054    ALKPHOS 104 11/23/2013 1054   BILITOT 0.5 10/25/2022 1054   BILITOT 0.4 04/17/2019 1318   BILITOT 0.3 11/23/2013 1054   GFRNONAA >60 10/25/2022 1054   GFRNONAA >60 11/23/2013 1054   GFRNONAA 57 (L) 04/14/2013 0925   GFRAA >60 10/30/2018 1027   GFRAA >60 11/23/2013 1054   GFRAA >60 04/14/2013 0925    No results found for: "SPEP", "UPEP"  Lab Results  Component Value Date   WBC 8.1 10/25/2022   NEUTROABS 5.8 10/25/2022   HGB 14.0 10/25/2022   HCT 43.2 10/25/2022   MCV 90.0 10/25/2022   PLT 262 10/25/2022      Chemistry      Component Value Date/Time   NA 137 10/25/2022 1054   NA 141 11/23/2013 1054   K 4.1 10/25/2022 1054   K 3.6 11/23/2013 1054   CL 104 10/25/2022 1054   CL 106 11/23/2013 1054   CO2 25 10/25/2022 1054   CO2 24 11/23/2013 1054   BUN 19 10/25/2022 1054   BUN 10 11/23/2013 1054   CREATININE 0.83 10/25/2022 1054   CREATININE 0.91 11/23/2013 1054   GLU 89 01/17/2021 1552      Component Value Date/Time   CALCIUM 9.5 10/25/2022 1054   CALCIUM 9.6 11/23/2013 1054   ALKPHOS 65 10/25/2022 1054   ALKPHOS 104 11/23/2013 1054   AST 23 10/25/2022 1054   AST 17 11/23/2013 1054   ALT 29 10/25/2022 1054   ALT 38 11/23/2013 1054   BILITOT 0.5 10/25/2022 1054   BILITOT 0.4 04/17/2019 1318   BILITOT 0.3 11/23/2013 1054       RADIOGRAPHIC STUDIES: I have personally reviewed the radiological images as listed and agreed with the findings in the report. No results found.  ASSESSMENT & PLAN:  Carcinoma of overlapping sites of right breast in female, estrogen receptor positive (HCC) # TRIPLE POSITIVE BREAST CANCER:  S/p 10 years- OCT 2024- mammo-pending-   Continue monitoring at this time.  Stable.  No evidence of recurrence.  # DM-  [July 2023]-pre-diabetic level- on metformin.   # bone density: NOV 2021-= T-score of -0.7.  Recommend ca+vit D-; BMD in dec 2023-  T-score: of -0.8- STABLE.   # Genetics testing: BRCA 1; &2-NEGATIVE.    # DISPOSITION: #  follow up in 12 months;MD;  labs- cbc/cmp/ca-27-29- BIL  SCREENING mammo- prior-Dr.B   Orders Placed This Encounter  Procedures   MM 3D SCREENING MAMMOGRAM BILATERAL BREAST    Standing Status:   Future    Standing Expiration Date:   10/25/2023    Order Specific Question:   Reason for Exam (SYMPTOM  OR DIAGNOSIS REQUIRED)    Answer:   hx of breast cancer    Order Specific Question:   Preferred imaging location?    Answer:   [facility]   CBC with Differential (Cancer Center Only)    Standing Status:   Future    Standing Expiration Date:   10/25/2023   CMP (Cancer Center only)    Standing Status:   Future    Standing Expiration Date:   10/25/2023   Cancer antigen 27.29    Standing Status:   Future    Standing Expiration Date:   10/25/2023   All questions were answered. The patient knows to call the clinic with any problems, questions or concerns.      Earna Coder, MD 10/25/2022 12:03 PM

## 2022-10-26 LAB — CANCER ANTIGEN 27.29: CA 27.29: 12.5 U/mL (ref 0.0–38.6)

## 2022-10-27 ENCOUNTER — Encounter: Payer: Self-pay | Admitting: Obstetrics and Gynecology

## 2022-10-31 ENCOUNTER — Encounter: Payer: Self-pay | Admitting: Dietician

## 2022-10-31 NOTE — Progress Notes (Signed)
Have not heard back from patient to reschedule her cancelled appointment from 09/26/22. Sent notification to referring provider.

## 2022-11-05 ENCOUNTER — Other Ambulatory Visit (HOSPITAL_COMMUNITY)
Admission: RE | Admit: 2022-11-05 | Discharge: 2022-11-05 | Disposition: A | Discharge status: Home / Self Care | Payer: BC Managed Care – PPO | Source: Ambulatory Visit | Attending: Obstetrics & Gynecology | Admitting: Obstetrics & Gynecology

## 2022-11-05 ENCOUNTER — Ambulatory Visit (INDEPENDENT_AMBULATORY_CARE_PROVIDER_SITE_OTHER): Payer: BC Managed Care – PPO | Admitting: Obstetrics & Gynecology

## 2022-11-05 VITALS — BP 133/94 | HR 101 | Wt 207.7 lb

## 2022-11-05 DIAGNOSIS — N870 Mild cervical dysplasia: Secondary | ICD-10-CM | POA: Diagnosis not present

## 2022-11-05 DIAGNOSIS — R87612 Low grade squamous intraepithelial lesion on cytologic smear of cervix (LGSIL): Secondary | ICD-10-CM | POA: Diagnosis not present

## 2022-11-05 NOTE — Progress Notes (Signed)
GYNECOLOGY PROGRESS NOTE  Subjective:    Patient ID: Teresa Franco, female    DOB: 12-06-1960, 62 y.o.   MRN: [id number]  HPI  Patient is a 62 y.o. G4P1011 (61 yo daughter) here today because of a LGSIL pap last week. HR HPV was negative. She has a h/o recurrent LGSIL. Her pap prior to the one last week was in 2022. Dr. Chauncey Cruel did a colpo with biopsy and ECC 12/23/2020. They both showed LGSIL.  The following portions of the patient's history were reviewed and updated as appropriate: allergies, current medications, past family history, past medical history, past social history, past surgical history, and problem list.  Review of Systems Pertinent items are noted in HPI.  She is sexually active.  Objective:   Blood pressure (!) 133/94, pulse (!) 101, weight 207 lb 11.2 oz (94.2 kg). Body mass index is 31.58 kg/m. Well nourished, well hydrated White female, no apparent distress She is ambulating and conversing normally. UPT negative, consent signed, time out done Speculum placed. Cervix prepped with acetic acid. Transformation zone seen in its entirety. Colpo adequate. Colposcopic findings normal. Her cervix appeared to be stenotic. I sprayed Hurricaine spray onto the cervix and then grasped the anterior lip with a single tooth tenaculum. I obtained an ECC after dilating the cervix gently. She tolerated the procedure well.    Assessment:   Recurrent LGSIL pap  Plan:   Await pathology. If the ECC is benign, then she has the option of repeating pap next year. If the ECC is abnormal, then I would recommend a LEEP. If the ECC is benign, but she wants to have a LEEP so that hopefully her future paps are normal, then I would support that decision also.

## 2022-11-07 LAB — SURGICAL PATHOLOGY

## 2022-11-12 ENCOUNTER — Encounter: Payer: Self-pay | Admitting: Obstetrics & Gynecology

## 2022-11-15 DIAGNOSIS — G510 Bell's palsy: Secondary | ICD-10-CM | POA: Diagnosis not present

## 2022-11-15 DIAGNOSIS — G5133 Clonic hemifacial spasm, bilateral: Secondary | ICD-10-CM | POA: Diagnosis not present

## 2022-11-15 DIAGNOSIS — G245 Blepharospasm: Secondary | ICD-10-CM | POA: Diagnosis not present

## 2022-11-30 ENCOUNTER — Ambulatory Visit: Payer: BC Managed Care – PPO | Admitting: Plastic Surgery

## 2022-11-30 ENCOUNTER — Encounter: Payer: Self-pay | Admitting: Plastic Surgery

## 2022-11-30 VITALS — BP 123/85 | HR 109 | Ht 68.0 in | Wt 203.4 lb

## 2022-11-30 DIAGNOSIS — Z853 Personal history of malignant neoplasm of breast: Secondary | ICD-10-CM | POA: Diagnosis not present

## 2022-11-30 DIAGNOSIS — Z923 Personal history of irradiation: Secondary | ICD-10-CM | POA: Diagnosis not present

## 2022-11-30 DIAGNOSIS — N651 Disproportion of reconstructed breast: Secondary | ICD-10-CM | POA: Diagnosis not present

## 2022-11-30 DIAGNOSIS — Z170 Estrogen receptor positive status [ER+]: Secondary | ICD-10-CM | POA: Diagnosis not present

## 2022-11-30 DIAGNOSIS — Z8639 Personal history of other endocrine, nutritional and metabolic disease: Secondary | ICD-10-CM

## 2022-11-30 NOTE — Progress Notes (Signed)
Patient ID: KIYOMI MATTY, female    DOB: Dec 21, 1960, 62 y.o.   MRN: [id number]   Chief Complaint  Patient presents with   Advice Only   Breast Problem    The patient is a 62 year old female here for a consultation for breast reconstruction.  The patient was diagnosed in 2004 with right breast cancer.  The tumor was estrogen and progesterone positive and HER2 positive.  She received neoadjuvant chemotherapy followed by Taxol and Herceptin.  She also received right sided breast radiation.  The patient is 5 feet 8 inches tall and weighs 204 pounds.  Her body mass index is 30.4 kg/m.  She has significant breast asymmetry with probably a B cup on the right and an E cup on the left.  She would like better symmetry so she can fit into a bra and close much better. Her past medical history includes breast cancer, hyperlipidemia, heart disease and prediabetes. Her past surgical history includes of right breast lumpectomy in 2004 breast biopsy in 2010 and a brow lift with blepharoplasty in 2019.  The patient quit smoking in 1999.    Review of Systems  HENT: Negative.    Eyes: Negative.   Respiratory: Negative.    Cardiovascular: Negative.   Gastrointestinal: Negative.   Endocrine: Negative.   Genitourinary: Negative.   Musculoskeletal: Negative.     Past Medical History:  Diagnosis Date   Breast cancer (HCC) 2004   chemo/ radiation; Her2/neu +   Dysrhythmia    palpitations   Hyperlipidemia    LGSIL on Pap smear of cervix    Personal history of chemotherapy    Personal history of radiation therapy    Prediabetes    Seasonal allergies    UTI (urinary tract infection)     Past Surgical History:  Procedure Laterality Date   BREAST BIOPSY Right 07/2002   positive   BREAST BIOPSY Right 2010   negative   BREAST LUMPECTOMY Right 08/09/2002   positive for breast cancer   BROW LIFT Bilateral 06/25/2017   Procedure: BLEPHAROPLASTY UPPER EYELID WITH EXCESS SKIN;  Surgeon: Imagene Riches, MD;  Location: [facility];  Service: Ophthalmology;  Laterality: Bilateral;   COLONOSCOPY  03/31/2010   COLONOSCOPY WITH PROPOFOL N/A 11/12/2019   Procedure: COLONOSCOPY WITH PROPOFOL;  Surgeon: Wyline Mood, MD;  Location: [facility] ENDOSCOPY;  Service: Gastroenterology;  Laterality: N/A;   LEEP  04/10/1993   OTHER SURGICAL HISTORY  06/2017   eyelid, took off excessive skin      Current Outpatient Medications:    Ascorbic Acid (VITAMIN C) 1000 MG tablet, Take 1,000 mg by mouth daily., Disp: , Rfl:    BIOTIN PO, Take by mouth daily. , Disp: , Rfl:    Cholecalciferol (VITAMIN D3 PO), Take 1 capsule by mouth daily. Take 1000 Iu by  mouth daily., Disp: , Rfl:    ezetimibe (ZETIA) 10 MG tablet, TAKE 1 TABLET BY MOUTH EVERY DAY, Disp: 90 tablet, Rfl: 3   MAGNESIUM CITRATE PO, Take by mouth., Disp: , Rfl:    MEGARED OMEGA-3 KRILL OIL PO, Take by mouth daily. , Disp: , Rfl:    Milk Thistle 175 MG CAPS, Take by mouth daily., Disp: , Rfl:    rosuvastatin (CRESTOR) 10 MG tablet, TAKE 1 TABLET BY MOUTH EVERY DAY, Disp: 30 tablet, Rfl: 11   VIT B12-METHIONINE-INOS-CHOL IM, daily. , Disp: , Rfl:    Objective:   Vitals:   11/30/22 0923  BP: 123/85  Pulse: (!) 109  SpO2: 96%    Physical Exam Constitutional:      Appearance: Normal appearance.  HENT:     Head: Atraumatic.  Cardiovascular:     Rate and Rhythm: Normal rate.     Pulses: Normal pulses.  Pulmonary:     Effort: Pulmonary effort is normal.  Abdominal:     Palpations: Abdomen is soft.  Musculoskeletal:        General: No swelling or deformity.  Skin:    General: Skin is warm.     Capillary Refill: Capillary refill takes less than 2 seconds.  Neurological:     Mental Status: She is alert and oriented to person, place, and time.  Psychiatric:        Mood and Affect: Mood normal.        Behavior: Behavior normal.        Thought Content: Thought content normal.        Judgment: Judgment normal.     Assessment &  Plan:  History of right breast cancer  Mixed hyperlipidemia  Carcinoma of overlapping sites of right breast in female, estrogen receptor positive (HCC)  The patient is a really good candidate for left-sided breast reduction.  Due to the radiation on the right side it would be best to leave that side alone and not create any issues with healing.  The patient is in agreement.  Plan for left breast reduction with liposuction for symmetry.  Pictures were obtained of the patient and placed in the chart with the patient's or guardian's permission.   Alena Bills Chanan Detwiler, DO

## 2022-11-30 NOTE — Progress Notes (Deleted)
Patient ID: Teresa Franco, female    DOB: September 19, 1960, 62 y.o.   MRN: [id number]   Chief Complaint  Patient presents with   Breast Cancer   Breast Problem    HPI  Review of Systems  Constitutional:  Positive for activity change. Negative for appetite change.  Eyes: Negative.   Respiratory: Negative.  Negative for chest tightness and shortness of breath.   Cardiovascular: Negative.   Gastrointestinal: Negative.   Endocrine: Negative.   Genitourinary: Negative.   Neurological: Negative.   Hematological: Negative.   Psychiatric/Behavioral: Negative.      Past Medical History:  Diagnosis Date   Breast cancer (HCC) 2004   chemo/ radiation; Her2/neu +   Dysrhythmia    palpitations   Hyperlipidemia    LGSIL on Pap smear of cervix    Personal history of chemotherapy    Personal history of radiation therapy    Prediabetes    Seasonal allergies    UTI (urinary tract infection)     Past Surgical History:  Procedure Laterality Date   BREAST BIOPSY Right 07/2002   positive   BREAST BIOPSY Right 2010   negative   BREAST LUMPECTOMY Right 08/09/2002   positive for breast cancer   BROW LIFT Bilateral 06/25/2017   Procedure: BLEPHAROPLASTY UPPER EYELID WITH EXCESS SKIN;  Surgeon: Imagene Riches, MD;  Location: [facility];  Service: Ophthalmology;  Laterality: Bilateral;   COLONOSCOPY  03/31/2010   COLONOSCOPY WITH PROPOFOL N/A 11/12/2019   Procedure: COLONOSCOPY WITH PROPOFOL;  Surgeon: Wyline Mood, MD;  Location: [facility] END[facility];  Service: Gastroenterology;  Laterality: N/A;   LEEP  04/10/1993   OTHER SURGICAL HISTORY  06/2017   eyelid, took off excessive skin      Current Outpatient Medications:    Ascorbic Acid (VITAMIN C) 1000 MG tablet, Take 1,000 mg by mouth daily., Disp: , Rfl:    BIOTIN PO, Take by mouth daily. , Disp: , Rfl:    Cholecalciferol (VITAMIN D3 PO), Take 1 capsule by mouth daily. Take 1000 Iu by  mouth daily., Disp: , Rfl:    ezetimibe (ZETIA)  10 MG tablet, TAKE 1 TABLET BY MOUTH EVERY DAY, Disp: 90 tablet, Rfl: 3   MAGNESIUM CITRATE PO, Take by mouth., Disp: , Rfl:    MEGARED OMEGA-3 KRILL OIL PO, Take by mouth daily. , Disp: , Rfl:    Milk Thistle 175 MG CAPS, Take by mouth daily., Disp: , Rfl:    rosuvastatin (CRESTOR) 10 MG tablet, TAKE 1 TABLET BY MOUTH EVERY DAY, Disp: 30 tablet, Rfl: 11   VIT B12-METHIONINE-INOS-CHOL IM, daily. , Disp: , Rfl:    Objective:   There were no vitals filed for this visit.  Physical Exam Vitals and nursing note reviewed.  Constitutional:      Appearance: Normal appearance.  HENT:     Head: Atraumatic.  Cardiovascular:     Rate and Rhythm: Normal rate.     Pulses: Normal pulses.  Pulmonary:     Effort: Pulmonary effort is normal.  Abdominal:     Palpations: Abdomen is soft.  Musculoskeletal:        General: No swelling or deformity.  Skin:    General: Skin is warm.     Capillary Refill: Capillary refill takes less than 2 seconds.  Neurological:     Mental Status: She is alert and oriented to person, place, and time.  Psychiatric:        Mood and Affect: Mood normal.  Behavior: Behavior normal.        Thought Content: Thought content normal.        Judgment: Judgment normal.     Assessment & Plan:  Carcinoma of overlapping sites of right breast in female, estrogen receptor positive (HCC)  History of diabetes mellitus, type II  ***  [name], DO

## 2022-11-30 NOTE — Addendum Note (Signed)
Addended by: Peggye Form on: 11/30/2022 10:25 AM   Modules accepted: Orders, Level of Service

## 2022-11-30 NOTE — Progress Notes (Addendum)
Patient ID: Teresa Franco, female    DOB: [birth year], 61 y.o.   MRN: [id number]   Chief Complaint  Patient presents with   Breast Cancer   Breast Problem    The patient is a 62 year old female here for a consultation for breast reconstruction.  The patient was diagnosed in 2004 with right breast cancer.  The tumor was estrogen and progesterone positive and HER2 positive.  She received neoadjuvant chemotherapy followed by Taxol and Herceptin.  She also received right sided breast radiation.  The patient is 5 feet 8 inches tall and weighs 204 pounds.  Her body mass index is 30.4 kg/m.  She has significant breast asymmetry with probably a B cup on the right and an E cup on the left.  She would like better symmetry so she can fit into a bra and close much better. Her past medical history includes breast cancer, hyperlipidemia, heart disease and prediabetes. Her past surgical history includes of right breast lumpectomy in 2004 breast biopsy in 2010 and a brow lift with blepharoplasty in 2019.  The patient quit smoking in 1999.     Review of Systems  Constitutional:  Positive for activity change. Negative for appetite change.  HENT: Negative.    Eyes: Negative.   Respiratory: Negative.    Cardiovascular: Negative.   Gastrointestinal: Negative.   Endocrine: Negative.   Genitourinary: Negative.   Musculoskeletal: Negative.   Neurological: Negative.   Hematological: Negative.     Past Medical History:  Diagnosis Date   Breast cancer (HCC) 2004   chemo/ radiation; Her2/neu +   Dysrhythmia    palpitations   Hyperlipidemia    LGSIL on Pap smear of cervix    Personal history of chemotherapy    Personal history of radiation therapy    Prediabetes    Seasonal allergies    UTI (urinary tract infection)     Past Surgical History:  Procedure Laterality Date   BREAST BIOPSY Right 07/2002   positive   BREAST BIOPSY Right 2010   negative   BREAST LUMPECTOMY Right 08/09/2002    positive for breast cancer   BROW LIFT Bilateral 06/25/2017   Procedure: BLEPHAROPLASTY UPPER EYELID WITH EXCESS SKIN;  Surgeon: Imagene Riches, MD;  Location: [facility];  Service: Ophthalmology;  Laterality: Bilateral;   COLONOSCOPY  03/31/2010   COLONOSCOPY WITH PROPOFOL N/A 11/12/2019   Procedure: COLONOSCOPY WITH PROPOFOL;  Surgeon: Wyline Mood, MD;  Location: [facility];  Service: Gastroenterology;  Laterality: N/A;   LEEP  04/10/1993   OTHER SURGICAL HISTORY  06/2017   eyelid, took off excessive skin      Current Outpatient Medications:    Ascorbic Acid (VITAMIN C) 1000 MG tablet, Take 1,000 mg by mouth daily., Disp: , Rfl:    BIOTIN PO, Take by mouth daily. , Disp: , Rfl:    Cholecalciferol (VITAMIN D3 PO), Take 1 capsule by mouth daily. Take 1000 Iu by  mouth daily., Disp: , Rfl:    ezetimibe (ZETIA) 10 MG tablet, TAKE 1 TABLET BY MOUTH EVERY DAY, Disp: 90 tablet, Rfl: 3   MAGNESIUM CITRATE PO, Take by mouth., Disp: , Rfl:    MEGARED OMEGA-3 KRILL OIL PO, Take by mouth daily. , Disp: , Rfl:    Milk Thistle 175 MG CAPS, Take by mouth daily., Disp: , Rfl:    rosuvastatin (CRESTOR) 10 MG tablet, TAKE 1 TABLET BY MOUTH EVERY DAY, Disp: 30 tablet, Rfl: 11   VIT B12-METHIONINE-INOS-CHOL  IM, daily. , Disp: , Rfl:    Objective:   There were no vitals filed for this visit.  Physical Exam Vitals and nursing note reviewed.  Constitutional:      Appearance: Normal appearance.  HENT:     Head: Atraumatic.  Cardiovascular:     Rate and Rhythm: Normal rate.     Pulses: Normal pulses.  Pulmonary:     Effort: Pulmonary effort is normal.  Abdominal:     Palpations: Abdomen is soft.  Musculoskeletal:        General: No swelling or deformity.  Skin:    Capillary Refill: Capillary refill takes less than 2 seconds.  Neurological:     Mental Status: She is alert and oriented to person, place, and time.  Psychiatric:        Mood and Affect: Mood normal.        Behavior:  Behavior normal.        Thought Content: Thought content normal.        Judgment: Judgment normal.     Assessment & Plan:  Carcinoma of overlapping sites of right breast in female, estrogen receptor positive (HCC)  History of diabetes mellitus, type II  Mixed hyperlipidemia   Carcinoma of overlapping sites of right breast in female, estrogen receptor positive (HCC)   The patient is a really good candidate for left-sided breast reduction.  Due to the radiation on the right side it would be best to leave that side alone and not create any issues with healing.  The patient is in agreement.  Plan for left breast reduction with liposuction for symmetry.   Pictures were obtained of the patient and placed in the chart with the patient's or guardian's permission.    Alena Bills Djeneba Barsch, DO

## 2022-12-03 ENCOUNTER — Other Ambulatory Visit (HOSPITAL_COMMUNITY)
Admission: RE | Admit: 2022-12-03 | Discharge: 2022-12-03 | Disposition: A | Discharge status: Home / Self Care | Payer: BC Managed Care – PPO | Source: Ambulatory Visit | Attending: Obstetrics & Gynecology | Admitting: Obstetrics & Gynecology

## 2022-12-03 ENCOUNTER — Ambulatory Visit: Payer: BC Managed Care – PPO | Admitting: Obstetrics & Gynecology

## 2022-12-03 VITALS — BP 129/90 | HR 103 | Wt 203.3 lb

## 2022-12-03 DIAGNOSIS — N870 Mild cervical dysplasia: Secondary | ICD-10-CM

## 2022-12-03 NOTE — Progress Notes (Signed)
    GYNECOLOGY PROGRESS NOTE  Subjective:    Patient ID: Teresa Franco, female    DOB: 1960-11-27, 62 y.o.   MRN: [id number]  HPI  Patient is a 62 y.o. G2P1011 here for LEEP to treat recurrent CIN 1 (on ECC recently and on ECC and ectocervix in 2022. She reports that she had a LEEP in the distant past.  The following portions of the patient's history were reviewed and updated as appropriate: allergies, current medications, past family history, past medical history, past social history, past surgical history, and problem list.  Review of Systems Pertinent items are noted in HPI.   Objective:   Blood pressure (!) 129/90, pulse (!) 103, weight 203 lb 4.8 oz (92.2 kg). Body mass index is 30.91 kg/m. Well nourished, well hydrated female, no apparent distress She is ambulating and conversing normally.   Colpo Biopsy:   Risks, benefits, alternatives, and limitations of procedure explained to patient, including pain, bleeding, infection, failure to remove abnormal tissue and failure to cure dysplasia, need for repeat procedures, damage to pelvic organs, cervical incompetence.  Role of HPV,cervical dysplasia and need for close followup was empasized. Informed written consent was obtained. All questions were answered. Time out performed. Urine pregnancy test was negative.  ??Procedure: The patient was placed in lithotomy position and the bivalved coated speculum was placed in the patient's vagina. A grounding pad placed on the patient. Acetic acid was applied to the cervix and all appeared normal. I sprayed Hurricaine spray.  Local anesthesia was administered via an intracervical block using 15cc of 2% Lidocaine with epinephrine. The suction was turned on and the medium semicircular LEEP tip on 50 Watts of cutting current and cautery was used to excise the entire transformation zone and any areas of visible dysplasia. I obtained an ECC.  Excellent hemostasis was achieved using roller ball  coagulation set at 50 Watts coagulation current. As per my usual, I coated the cone bed with Monsel's. The speculum was removed from the vagina. Specimens were sent to pathology.  ?The patient tolerated the procedure well. Post-operative instructions given to patient, including instruction to seek medical attention for persistent bright red bleeding, fever, abdominal/pelvic pain, dysuria, nausea or vomiting. She was also told about the possibility of having copious yellow to black tinged discharge for weeks. She was counseled to avoid anything in the vagina (sex/douching/tampons) for 3 weeks. She has a 3 week post-operative check to assess wound healing, review results and discuss further management.    Assessment:   1. Dysplasia of cervix, low grade (CIN 1)      Plan:   1. Dysplasia of cervix, low grade (CIN 1)  - Surgical pathology  She will have a follow up visit in about 3 weeks

## 2022-12-05 LAB — SURGICAL PATHOLOGY

## 2022-12-18 ENCOUNTER — Ambulatory Visit: Payer: BC Managed Care – PPO | Admitting: Obstetrics

## 2022-12-19 ENCOUNTER — Encounter: Payer: BC Managed Care – PPO | Admitting: Family

## 2022-12-24 ENCOUNTER — Ambulatory Visit: Payer: BC Managed Care – PPO | Admitting: Obstetrics

## 2022-12-24 ENCOUNTER — Encounter: Payer: BC Managed Care – PPO | Admitting: Family

## 2022-12-25 NOTE — Progress Notes (Unsigned)
    OBSTETRICS/GYNECOLOGY POST-OPERATIVE CLINIC VISIT  Subjective:     Teresa Franco is a 62 y.o. female who presents to the clinic 2 weeks status post  LEEP performed by Dr. Marice Potter  for  recurrent CIN1 . Eating a regular diet without difficulty. Bowel movements are normal. The patient is not having any pain.  The following portions of the patient's history were reviewed and updated as appropriate: allergies, current medications, past family history, past medical history, past social history, past surgical history, and problem list.  Review of Systems Pertinent items are noted in HPI.   Objective:   BP 128/80   Pulse 88   Ht 5\' 8"  (1.727 m)   Wt 203 lb (92.1 kg)   BMI 30.87 kg/m  Body mass index is 30.87 kg/m.  General:  alert and no distress  Abdomen: soft, bowel sounds active, non-tender  Cervix:   healing well, no drainage, no erythema, some scabbing still present    Pathology:  FINAL MICROSCOPIC DIAGNOSIS:   A. 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 ENDOCERVIX, CURETTAGE:  Detached fragments of mild squamous dysplasia with HPV effect (LSIL,  CIN-1)  Mucohemorrhagic debris with abundant admixed benign endocervical  epithelium and metaplastic squamous epithelium   Assessment:   Patient s/p LEEP, Doing well postoperatively.   Plan:   1. Continue any current medications as instructed by provider. 2. Wound care discussed. 3. Operative findings again reviewed. Pathology report discussed. 4. Activity restrictions:  pelvic rest for 2 more weeks 5. Anticipated return to work: not applicable. 6. Follow up:  repeat pap 12 mos     Julieanne Manson, DO [facility]

## 2022-12-26 ENCOUNTER — Ambulatory Visit: Payer: BC Managed Care – PPO | Admitting: Obstetrics

## 2022-12-26 ENCOUNTER — Encounter: Payer: Self-pay | Admitting: Obstetrics

## 2022-12-26 VITALS — BP 128/80 | HR 88 | Ht 68.0 in | Wt 203.0 lb

## 2022-12-26 DIAGNOSIS — N870 Mild cervical dysplasia: Secondary | ICD-10-CM

## 2022-12-28 ENCOUNTER — Other Ambulatory Visit: Payer: Self-pay | Admitting: Family

## 2022-12-28 ENCOUNTER — Encounter: Payer: Self-pay | Admitting: Family

## 2022-12-28 ENCOUNTER — Ambulatory Visit (INDEPENDENT_AMBULATORY_CARE_PROVIDER_SITE_OTHER): Payer: BC Managed Care – PPO | Admitting: Family

## 2022-12-28 VITALS — BP 120/76 | HR 98 | Temp 98.4°F | Ht 68.0 in | Wt 201.8 lb

## 2022-12-28 DIAGNOSIS — E663 Overweight: Secondary | ICD-10-CM

## 2022-12-28 DIAGNOSIS — E119 Type 2 diabetes mellitus without complications: Secondary | ICD-10-CM

## 2022-12-28 DIAGNOSIS — Z0000 Encounter for general adult medical examination without abnormal findings: Secondary | ICD-10-CM | POA: Diagnosis not present

## 2022-12-28 DIAGNOSIS — K7581 Nonalcoholic steatohepatitis (NASH): Secondary | ICD-10-CM | POA: Diagnosis not present

## 2022-12-28 DIAGNOSIS — Z8639 Personal history of other endocrine, nutritional and metabolic disease: Secondary | ICD-10-CM

## 2022-12-28 LAB — LIPID PANEL
Cholesterol: 151 mg/dL (ref 0–200)
HDL: 51.9 mg/dL (ref >39.00)
LDL Cholesterol: 64 mg/dL (ref 0–99)
NonHDL: 99.41
Total CHOL/HDL Ratio: 3
Triglycerides: 175 mg/dL — ABNORMAL HIGH (ref 0.0–149.0)
VLDL: 35 mg/dL (ref 0.0–40.0)

## 2022-12-28 LAB — VITAMIN D 25 HYDROXY (VIT D DEFICIENCY, FRACTURES): VITD: 66.43 ng/mL (ref 30.00–100.00)

## 2022-12-28 LAB — HEMOGLOBIN A1C: Hgb A1c MFr Bld: 6.8 % — ABNORMAL HIGH (ref 4.6–6.5)

## 2022-12-28 LAB — TSH: TSH: 1.35 u[IU]/mL (ref 0.35–5.50)

## 2022-12-28 MED ORDER — TIRZEPATIDE 2.5 MG/0.5ML ~~LOC~~ SOAJ: 2.5000 mg | SUBCUTANEOUS | 1 refills | Status: DC

## 2022-12-28 MED ORDER — TIRZEPATIDE-WEIGHT MANAGEMENT 2.5 MG/0.5ML ~~LOC~~ SOLN: 2.5000 mg | SUBCUTANEOUS | 2 refills | Status: DC

## 2022-12-28 NOTE — Patient Instructions (Signed)
start Zepbound 2.5mg  once per week injected subcutaneously ( [place])  in stomach. Please clean with alcohol swab prior to injection and be sure to rotate site. You may schedule a nurse visit if you would like to first injection.   After 4 weeks, and if tolerated and weight loss has not reached 1-2 lbs per week, please increase to 5mg  once per week [place].  Once you are actively losing weight, you do not need to further increase medication.  Dose increments are below.   7.5 mg/0.5 mL (0.5 mL) 10 mg/0.5 mL (0.5 mL) 12.5 mg/0.5 mL (0.5 mL) 15 mg/0.5 mL (0.5 mL)   Please read information on medication below and remember black box warning that you may not take if you or a family member is diagnosed with thyroid cancer (medullary thyroid cancer), or multiple endocrine neoplasia.       Brand Names: [place] Mounjaro; Zepbound Brand Names: [place] Mounjaro  Warning  This drug has been shown to cause thyroid cancer in some animals. It is not known if this happens in humans. If thyroid cancer happens, it may be deadly if not found and treated early. Call your doctor right away if you have a neck mass, trouble breathing, trouble swallowing, or have hoarseness that will not go away.  Do not use this drug if you have a health problem called Multiple Endocrine Neoplasia syndrome type 2 (MEN 2), or if you or a family member have had thyroid cancer.  Have your blood work checked and thyroid ultrasounds as you have been told by your doctor. What is this drug used for?  It is used to lower blood sugar in people with type 2 diabetes.  It is used to help with weight loss in certain people. What do I need to tell my doctor BEFORE I take this drug? All products:  If you are allergic to this drug; any part of this drug; or any other drugs, foods, or substances. Tell your doctor about the allergy and what signs you had.  If you have ever had pancreatitis.  If you have stomach or bowel problems.  If you are using another  drug that has the same drug in it.  If you are using another drug like this one. If you are not sure, ask your doctor or pharmacist. If you are using this drug for diabetes:  If you have type 1 diabetes. Do not use this drug to treat type 1 diabetes. Zepbound:  If you have or have ever had depression or thoughts of suicide. This is not a list of all drugs or health problems that interact with this drug. Tell your doctor and pharmacist about all of your drugs (prescription or OTC, natural products, vitamins) and health problems. You must check to make sure that it is safe for you to take this drug with all of your drugs and health problems. Do not start, stop, or change the dose of any drug without checking with your doctor. What are some things I need to know or do while I take this drug? All products:  Tell all of your health care providers that you take this drug. This includes your doctors, nurses, pharmacists, and dentists.  Follow the diet and workout plan that your doctor told you about.  Talk with your doctor before you drink alcohol.  Birth control pills may not work as well to prevent pregnancy. If you take birth control pills, you may need to switch to another type of hormone-based birth  control like a vaginal ring if your doctor tells you to. If another type of hormone-based birth control is not an option, use some other kind of birth control also, like a condom. Do this for 4 weeks after starting this drug and for 4 weeks each time the dose is raised.  This drug may prevent other drugs taken by mouth from getting into the body. If you take other drugs by mouth, you may need to take them at some other time than this drug. Talk with your doctor.  Do not share with another person even if the needle has been changed. Sharing your tray or pen may pass infections from one person to another. This includes infections you may not know you have.  If you cannot drink liquids by mouth or if you have  upset stomach, throwing up, or diarrhea that does not go away; you need to avoid getting dehydrated. Contact your doctor to find out what to do. Dehydration may lead to low blood pressure or to new or worse kidney problems.  A severe and sometimes deadly pancreas problem (pancreatitis) has happened with other drugs like this one. If you are using this drug for diabetes:  It may be harder to control blood sugar during times of stress such as fever, infection, injury, or surgery. A change in physical activity, exercise, or diet may also affect blood sugar.  Check your blood sugar as you have been told by your doctor.  Do not drive if your blood sugar has been low. There is a greater chance of you having a crash.  Wear disease medical alert ID (identification).  Tell your doctor if you are pregnant, plan on getting pregnant, or are breast-feeding. You will need to talk about the benefits and risks to you and the baby. Zepbound:  If you have high blood sugar (diabetes), you will need to watch your blood sugar closely.  Weight loss during pregnancy may cause harm to the unborn baby. If you get pregnant while taking this drug or if you want to get pregnant, call your doctor right away.  Tell your doctor if you are breast-feeding. You will need to talk about any risks to your baby. What are some side effects that I need to call my doctor about right away? WARNING/CAUTION: Even though it may be rare, some people may have very bad and sometimes deadly side effects when taking a drug. Tell your doctor or get medical help right away if you have any of the following signs or symptoms that may be related to a very bad side effect: All products:  Signs of an allergic reaction, like rash; hives; itching; red, swollen, blistered, or peeling skin with or without fever; wheezing; tightness in the chest or throat; trouble breathing, swallowing, or talking; unusual hoarseness; or swelling of the mouth, face, lips,  tongue, or throat.  Signs of kidney problems like unable to pass urine, change in how much urine is passed, blood in the urine, or a big weight gain.  Signs of gallbladder problems like pain in the upper right belly area, right shoulder area, or between the shoulder blades; yellow skin or eyes; fever with chills; bloating; or very upset stomach or throwing up.  Signs of a pancreas problem (pancreatitis) like very bad stomach pain, very bad back pain, or very bad upset stomach or throwing up.  Dizziness or passing out.  A fast heartbeat.  Change in eyesight.  Low blood sugar can happen. The chance may be  raised when this drug is used with other drugs for diabetes. Signs may be dizziness, headache, feeling sleepy or weak, shaking, fast heartbeat, confusion, hunger, or sweating. Call your doctor right away if you have any of these signs. Follow what you have been told to do for low blood sugar. This may include taking glucose tablets, liquid glucose, or some fruit juices. Zepbound:  New or worse behavior or mood changes like depression or thoughts of suicide. What are some other side effects of this drug? All drugs may cause side effects. However, many people have no side effects or only have minor side effects. Call your doctor or get medical help if any of these side effects or any other side effects bother you or do not go away: All products:  Constipation, diarrhea, stomach pain, upset stomach, throwing up, or decreased appetite.  Heartburn.  Pain, itching, or other irritation where the injection was given. Zepbound:  Feeling tired or weak. These are not all of the side effects that may occur. If you have questions about side effects, call your doctor. Call your doctor for medical advice about side effects. You may report side effects to your national health agency. How is this drug best taken? Use this drug as ordered by your doctor. Read all information given to you. Follow all instructions  closely. All products:  It is given as a shot into the fatty part of the skin on the top of the thigh, belly area, or upper arm.  If you will be giving yourself the shot, your doctor or nurse will teach you how to give the shot.  Keep taking this drug as you have been told by your doctor or other health care provider, even if you feel well.  Take the same day each week.  Move site where you give the shot each time.  Take with or without food.  Wash your hands before and after use.  Do not use if the solution is leaking or has particles.  This drug is colorless to a faint yellow. Do not use if the solution changes color.  Do not move this drug from the pen to a syringe.  Each pen or vial is for 1 use only. Throw away any part of the used pen after the dose is given.  Throw away needles in a needle/sharp disposal box. Do not reuse needles or other items. When the box is full, follow all local rules for getting rid of it. Talk with a doctor or pharmacist if you have any questions. If you are using this drug for diabetes:  If you are also using insulin, you may inject this drug and the insulin in the same area of the body but not right next to each other.  Do not mix this drug in the same syringe with insulin. What do I do if I miss a dose?  If it is within 4 days after the missed dose, take the missed dose and go back to your normal day.  If it has been more than 4 days since the missed dose, skip the missed dose and go back to your normal day.  Do not take 2 doses at the same time or extra doses. How do I store and/or throw out this drug?  Store in a refrigerator. Do not freeze.  Do not use if it has been frozen.  If needed, each pen or vial may be stored at room temperature for up to 21 days. If you store  at room temperature, throw away any part not used after 21 days.  Protect from heat.  Store in the original container to protect from light.  Keep all drugs in a safe place. Keep all drugs  out of the reach of children and pets.  Throw away unused or expired drugs. Do not flush down a toilet or pour down a drain unless you are told to do so. Check with your pharmacist if you have questions about the best way to throw out drugs. There may be drug take-back programs in your area. General drug facts  If your symptoms or health problems do not get better or if they become worse, call your doctor.  Do not share your drugs with others and do not take anyone else's drugs.  Some drugs may have another patient information leaflet. If you have any questions about this drug, please talk with your doctor, nurse, pharmacist, or other health care provider.  If you think there has been an overdose, call your poison control center or get medical care right away. Be ready to tell or show what was taken, how much, and when it happened. Last Reviewed Date 2021-11-24 Consumer Information Use and Disclaimer This generalized information is a limited summary of diagnosis, treatment, and/or medication information. It is not meant to be comprehensive and should be used as a tool to help the user understand and/or assess potential diagnostic and treatment options. It does NOT include all information about conditions, treatments, medications, side effects, or risks that may apply to a specific patient. It is not intended to be medical advice or a substitute for the medical advice, diagnosis, or treatment of a health care provider based on the health care provider's examination and assessment of a patient's specific and unique circumstances. Patients must speak with a health care provider for complete information about their health, medical questions, and treatment options, including any risks or benefits regarding use of medications. This information does not endorse any treatments or medications as safe, effective, or approved for treating a specific patient. UpToDate, Inc. and its affiliates disclaim any warranty or  liability relating to this information or the use thereof. The use of this information is governed by the Terms of Use, available at https://www.wolterskluwer.com/en/know/clinical-effectiveness-terms.  2023 UpToDate, Inc. and its affiliates and/or licensors. All rights reserved. Use of UpToDate is subject to the Terms of Use. Topic 161096 Version 16.0 Health Maintenance for Postmenopausal Women Menopause is a normal process in which your ability to get pregnant comes to an end. This process happens slowly over many months or years, usually between the ages of 4 and 66. Menopause is complete when you have missed your menstrual period for 12 months. It is important to talk with your health care provider about some of the most common conditions that affect women after menopause (postmenopausal women). These include heart disease, cancer, and bone loss (osteoporosis). Adopting a healthy lifestyle and getting preventive care can help to promote your health and wellness. The actions you take can also lower your chances of developing some of these common conditions. What are the signs and symptoms of menopause? During menopause, you may have the following symptoms: Hot flashes. These can be moderate or severe. Night sweats. Decrease in sex drive. Mood swings. Headaches. Tiredness (fatigue). Irritability. Memory problems. Problems falling asleep or staying asleep. Talk with your health care provider about treatment options for your symptoms. Do I need hormone replacement therapy? Hormone replacement therapy is effective in treating symptoms that are  caused by menopause, such as hot flashes and night sweats. Hormone replacement carries certain risks, especially as you become older. If you are thinking about using estrogen or estrogen with progestin, discuss the benefits and risks with your health care provider. How can I reduce my risk for heart disease and stroke? The risk of heart disease, heart  attack, and stroke increases as you age. One of the causes may be a change in the body's hormones during menopause. This can affect how your body uses dietary fats, triglycerides, and cholesterol. Heart attack and stroke are medical emergencies. There are many things that you can do to help prevent heart disease and stroke. Watch your blood pressure High blood pressure causes heart disease and increases the risk of stroke. This is more likely to develop in people who have high blood pressure readings or are overweight. Have your blood pressure checked: Every 3-5 years if you are 24-68 years of age. Every year if you are 32 years old or older. Eat a healthy diet  Eat a diet that includes plenty of vegetables, fruits, low-fat dairy products, and lean protein. Do not eat a lot of foods that are high in solid fats, added sugars, or sodium. Get regular exercise Get regular exercise. This is one of the most important things you can do for your health. Most adults should: Try to exercise for at least 150 minutes each week. The exercise should increase your heart rate and make you sweat (moderate-intensity exercise). Try to do strengthening exercises at least twice each week. Do these in addition to the moderate-intensity exercise. Spend less time sitting. Even light physical activity can be beneficial. Other tips Work with your health care provider to achieve or maintain a healthy weight. Do not use any products that contain nicotine or tobacco. These products include cigarettes, chewing tobacco, and vaping devices, such as e-cigarettes. If you need help quitting, ask your health care provider. Know your numbers. Ask your health care provider to check your cholesterol and your blood sugar (glucose). Continue to have your blood tested as directed by your health care provider. Do I need screening for cancer? Depending on your health history and family history, you may need to have cancer screenings at  different stages of your life. This may include screening for: Breast cancer. Cervical cancer. Lung cancer. Colorectal cancer. What is my risk for osteoporosis? After menopause, you may be at increased risk for osteoporosis. Osteoporosis is a condition in which bone destruction happens more quickly than new bone creation. To help prevent osteoporosis or the bone fractures that can happen because of osteoporosis, you may take the following actions: If you are 42-32 years old, get at least 1,000 mg of calcium and at least 600 international units (IU) of vitamin D per day. If you are older than age 48 but younger than age 36, get at least 1,200 mg of calcium and at least 600 international units (IU) of vitamin D per day. If you are older than age 22, get at least 1,200 mg of calcium and at least 800 international units (IU) of vitamin D per day. Smoking and drinking excessive alcohol increase the risk of osteoporosis. Eat foods that are rich in calcium and vitamin D, and do weight-bearing exercises several times each week as directed by your health care provider. How does menopause affect my mental health? Depression may occur at any age, but it is more common as you become older. Common symptoms of depression include: Feeling depressed. Changes  in sleep patterns. Changes in appetite or eating patterns. Feeling an overall lack of motivation or enjoyment of activities that you previously enjoyed. Frequent crying spells. Talk with your health care provider if you think that you are experiencing any of these symptoms. General instructions See your health care provider for regular wellness exams and vaccines. This may include: Scheduling regular health, dental, and eye exams. Getting and maintaining your vaccines. These include: Influenza vaccine. Get this vaccine each year before the flu season begins. Pneumonia vaccine. Shingles vaccine. Tetanus, diphtheria, and pertussis (Tdap) booster  vaccine. Your health care provider may also recommend other immunizations. Tell your health care provider if you have ever been abused or do not feel safe at home. Summary Menopause is a normal process in which your ability to get pregnant comes to an end. This condition causes hot flashes, night sweats, decreased interest in sex, mood swings, headaches, or lack of sleep. Treatment for this condition may include hormone replacement therapy. Take actions to keep yourself healthy, including exercising regularly, eating a healthy diet, watching your weight, and checking your blood pressure and blood sugar levels. Get screened for cancer and depression. Make sure that you are up to date with all your vaccines. This information is not intended to replace advice given to you by your health care provider. Make sure you discuss any questions you have with your health care provider. Document Revised: 05/16/2020 Document Reviewed: 05/16/2020 Elsevier Patient Education  2024 [employer].

## 2022-12-28 NOTE — Progress Notes (Signed)
Assessment & Plan:  Routine physical examination Assessment & Plan: Deferred clinical breast exam and pelvic exam due to patient preference and patient is following with oncology, gynecology.  Colonoscopy up-to-date.  Congratulated patient on diligence to exercise.  Orders: -     Lipid panel -     Hemoglobin A1c -     VITAMIN D 25 Hydroxy (Vit-D Deficiency, Fractures) -     TSH  NASH (nonalcoholic steatohepatitis) Assessment & Plan: Discussed weight loss in the setting of NASH.  Start Zepbound.  Orders: -     Tirzepatide-Weight Management; Inject 2.5 mg into the skin once a week.  Dispense: 2 mL; Refill: 2  Overweight (BMI 25.0-29.9) -     Tirzepatide-Weight Management; Inject 2.5 mg into the skin once a week.  Dispense: 2 mL; Refill: 2  History of diabetes mellitus, type II Assessment & Plan: History of diabetes.  More recently she has been prediabetic.  We jointly agreed weight loss would be advantageous to thwart progression of A1c, NASH.  Discussed method of action, blackbox warning and titration of Zepbound.  Orders: -     Tirzepatide-Weight Management; Inject 2.5 mg into the skin once a week.  Dispense: 2 mL; Refill: 2     Return precautions given.   Risks, benefits, and alternatives of the medications and treatment plan prescribed today were discussed, and patient expressed understanding.   Education regarding symptom management and diagnosis given to patient on AVS either electronically or printed.  Return in about 6 weeks (around 02/08/2023).  [name], FNP  Subjective:    Patient ID: Teresa Franco, female    DOB: [birth year], 61 y.o.   MRN: [id number]  CC: Teresa Franco is a 62 y.o. female who presents today for physical exam.    HPI: She is interested in [facility] for weight loss.  Weight has plataued.  She is concerned about progression of A1c  She is going to have breast reduction in January 2025.   History of nonalcoholic hepatic steatosis,  DM, and more recently prediabetes  No personal or family history of thyroid cancer, multiple endocrine neoplasia.  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 Repeat pap smear in 1 year.   Bone Health screening/DEXA for 65+: normal 12/2021  Lung Cancer Screening: Doesn't have 20 year pack year history and age > 9 years yo 62 years       Tetanus - UTD        Exercise: Gets regular exercise with [employer] 4 days per week.   Alcohol use: Very occasional Smoking/tobacco use: former smoker.    Health Maintenance  Topic Date Due   COVID-19 Vaccine (5 - 2024-25 season) 09/09/2022   Flu Shot  04/08/2023*   Mammogram  10/24/2024   DTaP/Tdap/Td vaccine (3 - Td or Tdap) 04/20/2026   Pap with HPV screening  10/16/2027   Colon Cancer Screening  11/11/2029   Hepatitis C Screening  Completed   HIV Screening  Completed   Zoster (Shingles) Vaccine  Completed   HPV Vaccine  Aged Out  *Topic was postponed. The date shown is not the original due date.    ALLERGIES: Sulfa  antibiotics  Current Outpatient Medications on File Prior to Visit  Medication Sig Dispense Refill   Ascorbic Acid (VITAMIN C) 1000 MG tablet Take 1,000 mg by mouth daily.     BIOTIN PO Take by mouth daily.      Cholecalciferol (VITAMIN D3 PO) Take 1 capsule by mouth daily. Take 1000 Iu by  mouth daily.     ezetimibe (ZETIA) 10 MG tablet TAKE 1 TABLET BY MOUTH EVERY DAY 90 tablet 3   MAGNESIUM CITRATE PO Take by mouth.     MEGARED OMEGA-3 KRILL OIL PO Take by mouth daily.      Milk Thistle 175 MG CAPS Take by mouth daily.     rosuvastatin (CRESTOR) 10 MG tablet TAKE 1 TABLET BY  MOUTH EVERY DAY 30 tablet 11   VIT B12-METHIONINE-INOS-CHOL IM daily.      No current facility-administered medications on file prior to visit.    Review of Systems  Constitutional:  Negative for chills and fever.  Respiratory:  Negative for cough.   Cardiovascular:  Negative for chest pain and palpitations.  Gastrointestinal:  Negative for nausea and vomiting.      Objective:    BP 120/76   Pulse 98   Temp 98.4 F (36.9 C) (Oral)   Ht 5\' 8"  (1.727 m)   Wt 201 lb 12.8 oz (91.5 kg)   SpO2 99%   BMI 30.68 kg/m   BP Readings from Last 3 Encounters:  12/28/22 120/76  12/26/22 128/80  12/03/22 (!) 129/90   Wt Readings from Last 3 Encounters:  12/28/22 201 lb 12.8 oz (91.5 kg)  12/26/22 203 lb (92.1 kg)  12/03/22 203 lb 4.8 oz (92.2 kg)    Physical Exam Vitals reviewed.  Constitutional:      Appearance: She is well-developed.  Eyes:     Conjunctiva/sclera: Conjunctivae normal.  Cardiovascular:     Rate and Rhythm: Normal rate and regular rhythm.     Pulses: Normal pulses.     Heart sounds: Normal heart sounds.  Pulmonary:     Effort: Pulmonary effort is normal.     Breath sounds: Normal breath sounds. No wheezing, rhonchi or rales.  Skin:    General: Skin is warm and dry.  Neurological:     Mental Status: She is alert.  Psychiatric:        Speech: Speech normal.        Behavior: Behavior normal.        Thought Content: Thought content normal.

## 2022-12-28 NOTE — Assessment & Plan Note (Signed)
Deferred clinical breast exam and pelvic exam due to patient preference and patient is following with oncology, gynecology.  Colonoscopy up-to-date.  Congratulated patient on diligence to exercise.

## 2022-12-28 NOTE — Assessment & Plan Note (Signed)
Discussed weight loss in the setting of NASH.  Start Zepbound.

## 2022-12-28 NOTE — Assessment & Plan Note (Signed)
History of diabetes.  More recently she has been prediabetic.  We jointly agreed weight loss would be advantageous to thwart progression of A1c, NASH.  Discussed method of action, blackbox warning and titration of Zepbound.

## 2023-01-01 ENCOUNTER — Other Ambulatory Visit (HOSPITAL_COMMUNITY): Payer: Self-pay

## 2023-01-01 ENCOUNTER — Telehealth: Payer: Self-pay

## 2023-01-01 NOTE — Telephone Encounter (Signed)
Pharmacy Patient Advocate Encounter   Received notification from RX Request Messages that prior authorization for [facility] is required/requested.   Insurance verification completed.   The patient is insured through [facility] .   Per test claim: PA is required, PA has been submitted to [facility]  KEY: [id number]

## 2023-01-01 NOTE — Telephone Encounter (Signed)
Pharmacy Patient Advocate Encounter  Received notification from [facility] that Prior Authorization for [facility] 2.5MG /0.5ML auto-injectors has been APPROVED from 01/01/2023 to 01/01/2024. Ran test claim, Copay is $25.00. This test claim was processed through [facility]- copay amounts may vary at other pharmacies due to pharmacy/plan contracts, or as the patient moves through the different stages of their insurance plan.   PA #/Case ID/Reference #:  [id number]

## 2023-01-01 NOTE — Telephone Encounter (Signed)
PA for Greggory Keen has been submitted and documented in separate encounter, please sign off on rx in this encounter as PA team is unable to resolve RX requests. Thank you

## 2023-01-03 NOTE — Telephone Encounter (Signed)
LMTCB. When she returns call, please let her know that her Teresa Franco was approved by her insurance

## 2023-01-04 NOTE — Telephone Encounter (Signed)
LMTCB

## 2023-01-07 NOTE — Telephone Encounter (Signed)
Spoke to pt and she is aware.

## 2023-01-23 ENCOUNTER — Encounter: Payer: Self-pay | Admitting: Plastic Surgery

## 2023-02-05 ENCOUNTER — Encounter: Payer: Self-pay | Admitting: Nurse Practitioner

## 2023-02-05 ENCOUNTER — Telehealth: Payer: Self-pay | Admitting: Internal Medicine

## 2023-02-05 ENCOUNTER — Inpatient Hospital Stay: Payer: BC Managed Care – PPO | Attending: Nurse Practitioner | Admitting: Nurse Practitioner

## 2023-02-05 VITALS — BP 153/81 | HR 116 | Temp 97.7°F | Wt 198.0 lb

## 2023-02-05 DIAGNOSIS — Z170 Estrogen receptor positive status [ER+]: Secondary | ICD-10-CM | POA: Insufficient documentation

## 2023-02-05 DIAGNOSIS — N6311 Unspecified lump in the right breast, upper outer quadrant: Secondary | ICD-10-CM | POA: Diagnosis not present

## 2023-02-05 DIAGNOSIS — Z1731 Human epidermal growth factor receptor 2 positive status: Secondary | ICD-10-CM | POA: Insufficient documentation

## 2023-02-05 DIAGNOSIS — Z87891 Personal history of nicotine dependence: Secondary | ICD-10-CM | POA: Insufficient documentation

## 2023-02-05 DIAGNOSIS — Z1721 Progesterone receptor positive status: Secondary | ICD-10-CM | POA: Diagnosis not present

## 2023-02-05 DIAGNOSIS — Z853 Personal history of malignant neoplasm of breast: Secondary | ICD-10-CM

## 2023-02-05 DIAGNOSIS — C50811 Malignant neoplasm of overlapping sites of right female breast: Secondary | ICD-10-CM | POA: Diagnosis not present

## 2023-02-05 NOTE — Progress Notes (Signed)
[facility]  [facility] A Department of the [place]. [facility] [address] [place], [place] [address] [phone] (phone) [phone] (fax)  Patient Care Team: Allegra Grana, FNP as PCP - General (Family Medicine) Earna Coder, MD as Consulting Physician (Oncology)   Name of the patient: Teresa Franco  [id number]  14-Jan-1960   Date of visit: 02/05/23  Diagnosis- hx of breast cancer  Chief complaint/ Reason for visit- right breast lump  Heme/Onc history:  Oncology History Overview Note  Her2/neu + right breast cancer in 07/2012.  No pathology is available. Lymph nodes were positive.  Tumor was ER/PR positive and HER-2/neu positive.    She received neoadjuvant AC followed by Taxol and Herceptin.  Lumpectomy revealed a complete pathologic response. She received radiation in 03/2003.    She received tamoxifen from 02/2003 - 02/2008. She received her Herceptin from 05/2003 - 05/2004.  She received extended adjuvant hormonal therapy with Lupron and Femara from 03/2008 - 04/2013.  She tolerated her treatment well.   Bilateral screening mammogram on 10/06/2018 revealed no evidence of malignancy.  Bilateral screening mammogram on 10/13/2019 revealed no evidence of malignancy.   CA27.29 has been followed: 15.3 on 08/14/2011, 16.2 on 02/14/2012, 16.0 on 04/14/2013, 9.2 on 11/23/2013, 14.3 on 09/22/2014, 13.3 on 10/04/2016, 9.4 on 10/03/2017, 11.5 on 10/09/2018, 8.2 on 10/30/2018, and 6.9 on 10/15/2019.   Bone density on 09/20/2011 was normal with a T-score of -0.7 of the right femoral neck.    r. 5.   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 During routine self breast exam she noticed lump in her surgical scar. Not painful. No skin changes. Has chronic pain since radiation described as jolting or pulling at times but hasn't worsened. Otherwise feels at baseline. She's very nervous about this finding and says she hasn't been eating or sleeping d/t anxiety.   Review of systems- Review of Systems  Constitutional:  Negative for chills, fever, malaise/fatigue and weight loss.  Skin:  Negative for itching and rash.  Neurological:  Negative for weakness.  Psychiatric/Behavioral:  Negative for depression. The patient is nervous/anxious.      Allergies  Allergen Reactions   Sulfa Antibiotics Rash   Past Medical History:  Diagnosis Date   Breast cancer (HCC) 2004   chemo/ radiation; Her2/neu +   Dysrhythmia    palpitations   Hyperlipidemia    LGSIL on Pap smear of cervix    Personal history of chemotherapy    Personal history of radiation therapy    Prediabetes    Seasonal allergies    UTI (urinary tract infection)    Past Surgical History:  Procedure Laterality Date   BREAST BIOPSY Right 07/2002   positive   BREAST BIOPSY Right 2010   negative   BREAST LUMPECTOMY Right 08/09/2002   positive for breast cancer   BROW LIFT Bilateral 06/25/2017   Procedure: BLEPHAROPLASTY UPPER EYELID WITH EXCESS SKIN;  Surgeon: Ether Griffins, Amy  M, MD;  Location[facility];  Service: Ophthalmology;  Laterality: Bilateral;   COLONOSCOPY  03/31/2010   COLONOSCOPY WITH PROPOFOL N/A 11/12/2019   Procedure: COLONOSCOPY WITH PROPOFOL;  Surgeon: Wyline Mood, MD;  Location: [facility];  Service: Gastroenterology;  Laterality: N/A;   LEEP  04/10/1993   OTHER SURGICAL HISTORY  06/2017   eyelid, took off excessive skin   Social History    Socioeconomic History   Marital status: Married    Spouse name: Not on file   Number of children: Not on file   Years of education: Not on file   Highest education level: Bachelor's degree (e.g., BA, AB, BS)  Occupational History   Not on file  Tobacco Use   Smoking status: Former    Current packs/day: 0.00    Types: Cigarettes    Quit date: 01/08/1997    Years since quitting: 26.0   Smokeless tobacco: Never  Vaping Use   Vaping status: Never Used  Substance and Sexual Activity   Alcohol use: Yes    Comment: sometimes on holidays   Drug use: No   Sexual activity: Yes    Birth control/protection: Post-menopausal  Other Topics Concern   Not on file  Social History Narrative   From [place]   Works for telecommunication    1 daughter, Freshmen at [employer] in [employer]            Social Drivers of Health   Financial Resource Strain: Low Risk  (12/23/2022)   Overall Financial Resource Strain (CARDIA)    Difficulty of Paying Living Expenses: Not hard at all  Food Insecurity: No Food Insecurity (12/23/2022)   Hunger Vital Sign    Worried About Running Out of Food in the Last Year: Never true    Ran Out of Food in the Last Year: Never true  Transportation Needs: No Transportation Needs (12/23/2022)   PRAPARE - Administrator, Civil Service (Medical): No    Lack of Transportation (Non-Medical): No  Physical Activity: Sufficiently Active (12/23/2022)   Exercise Vital Sign    Days of Exercise per Week: 6 days    Minutes of Exercise per Session: 60 min  Stress: No Stress Concern Present (12/23/2022)   [employer] of Occupational Health - Occupational Stress Questionnaire    Feeling of Stress : Only a little  Social Connections: Moderately Isolated (12/23/2022)   Social Connection and Isolation Panel [NHANES]    Frequency of Communication with Friends and Family: More than three times a week    Frequency of Social Gatherings with Friends and Family: Twice  a week    Attends Religious Services: More than 4 times per year    Active Member of [employer] or Organizations: No    Attends Engineer, structural: Not on file    Marital Status: Separated  Intimate Partner Violence: Not on file   Family History  Problem Relation Age of Onset   Hyperlipidemia Mother    Heart disease Mother    Thyroid disease Mother        no thyroid cancer   Hyperlipidemia Father    Heart disease Father 21       MI- died at 38 ? SCD   Hypertension Father    Breast cancer Maternal Aunt 50   Diabetes Maternal Aunt    Breast cancer Maternal Aunt 60   Diabetes Maternal Aunt    Colon cancer Neg Hx    Ovarian cancer Neg Hx  Melanoma Neg Hx    Thyroid cancer Neg Hx     Current Outpatient Medications:    Ascorbic Acid (VITAMIN C) 1000 MG tablet, Take 1,000 mg by mouth daily., Disp: , Rfl:    BIOTIN PO, Take by mouth daily. , Disp: , Rfl:    Cholecalciferol (VITAMIN D3 PO), Take 1 capsule by mouth daily. Take 1000 Iu by  mouth daily., Disp: , Rfl:    ezetimibe (ZETIA) 10 MG tablet, TAKE 1 TABLET BY MOUTH EVERY DAY, Disp: 90 tablet, Rfl: 3   MAGNESIUM CITRATE PO, Take by mouth., Disp: , Rfl:    MEGARED OMEGA-3 KRILL OIL PO, Take by mouth daily. , Disp: , Rfl:    Milk Thistle 175 MG CAPS, Take by mouth daily., Disp: , Rfl:    rosuvastatin (CRESTOR) 10 MG tablet, TAKE 1 TABLET BY MOUTH EVERY DAY, Disp: 30 tablet, Rfl: 11   tirzepatide (MOUNJARO) 2.5 MG/0.5ML Pen, Inject 2.5 mg into the skin once a week., Disp: 2 mL, Rfl: 1   VIT B12-METHIONINE-INOS-CHOL IM, daily. , Disp: , Rfl:   Physical exam:  Vitals:   02/05/23 1358  BP: (!) 153/81  Pulse: (!) 116  Temp: 97.7 F (36.5 C)  TempSrc: Tympanic  SpO2: 100%  Weight: 198 lb (89.8 kg)   Physical Exam Constitutional:      Appearance: She is not ill-appearing.  Chest:  Breasts:    Right: Mass present. No swelling, inverted nipple, nipple discharge, skin change or tenderness.     Comments: Exam performed  in seating and lying position. Right breast- lumpectomy scars well healed. 1mm nodule at 10:00, 6 cmfn in superior scar. Otherwise no adenopathy. No skin changes. Not painful.  Lymphadenopathy:     Upper Body:     Right upper body: No supraclavicular, axillary or pectoral adenopathy.  Neurological:     Mental Status: She is alert and oriented to person, place, and time.     Assessment and plan- Patient is a 63 y.o. female who presents to clinic for new   Right breast mass- hx of right breast cancer in 2004, extended endocrine therapy, now on surveillance who presents with new 1mm nodule at 10:00, 6 cmfn in superior scar. Recommend diagnostic mammogram to evaluate further. Orders placed. Declined medication for anxiety and sleep. Follow up based on results.    Visit Diagnosis 1. Breast lump on right side at 10 o'clock position   2. Hx of breast cancer     Patient expressed understanding and was in agreement with this plan. She also understands that She can call clinic at any time with any questions, concerns, or complaints.   Thank you for allowing me to participate in the care of this very pleasant patient.   Consuello Masse, DNP, AGNP-C, AOCNP [facility] [phone]

## 2023-02-05 NOTE — Telephone Encounter (Signed)
This patient called and left a voicemail that she was doing a self breast exam and found something concerning. She is requesting an appointment with Dr. B. Please advise how to schedule her. Thank you

## 2023-02-05 NOTE — Telephone Encounter (Signed)
Pt called and she was doing her breast check and she felt a spot that should be looked about. Pt can come today at 2 pm.she is ok with that time slot

## 2023-02-07 ENCOUNTER — Ambulatory Visit
Admission: RE | Admit: 2023-02-07 | Discharge: 2023-02-07 | Disposition: A | Discharge status: Home / Self Care | Payer: BC Managed Care – PPO | Source: Ambulatory Visit | Attending: Nurse Practitioner | Admitting: Nurse Practitioner

## 2023-02-07 DIAGNOSIS — Z853 Personal history of malignant neoplasm of breast: Secondary | ICD-10-CM

## 2023-02-07 DIAGNOSIS — R921 Mammographic calcification found on diagnostic imaging of breast: Secondary | ICD-10-CM | POA: Diagnosis not present

## 2023-02-07 DIAGNOSIS — N6311 Unspecified lump in the right breast, upper outer quadrant: Secondary | ICD-10-CM

## 2023-02-07 DIAGNOSIS — R92321 Mammographic fibroglandular density, right breast: Secondary | ICD-10-CM | POA: Diagnosis not present

## 2023-02-08 ENCOUNTER — Ambulatory Visit (INDEPENDENT_AMBULATORY_CARE_PROVIDER_SITE_OTHER): Payer: BC Managed Care – PPO | Admitting: Family

## 2023-02-08 ENCOUNTER — Encounter: Payer: Self-pay | Admitting: Family

## 2023-02-08 DIAGNOSIS — Z7985 Long-term (current) use of injectable non-insulin antidiabetic drugs: Secondary | ICD-10-CM

## 2023-02-08 DIAGNOSIS — E119 Type 2 diabetes mellitus without complications: Secondary | ICD-10-CM | POA: Insufficient documentation

## 2023-02-08 MED ORDER — TIRZEPATIDE 2.5 MG/0.5ML ~~LOC~~ SOAJ: 2.5000 mg | SUBCUTANEOUS | 1 refills | Status: DC

## 2023-02-08 NOTE — Progress Notes (Signed)
Assessment & Plan:  Diabetes mellitus without complication (HCC) Assessment & Plan: Very pleased with weight loss.  Anticipate A1c to be improved.  patient and I discussed weight loss not to exceed 2 pounds per week due to safety.  She is very much in agreement with this.  Continue Mounjaro and patient will let me know if weight loss were to exceed 2 pounds per week  Orders: -     Tirzepatide; Inject 2.5 mg into the skin once a week.  Dispense: 2 mL; Refill: 1     Return precautions given.   Risks, benefits, and alternatives of the medications and treatment plan prescribed today were discussed, and patient expressed understanding.   Education regarding symptom management and diagnosis given to patient on AVS either electronically or printed.  Return in about 3 months (around 05/08/2023).  [name], FNP  Subjective:    Patient ID: Teresa Franco, female    DOB: 03-19-1960, 63 y.o.   MRN: [id number]  CC: Teresa Franco is a 63 y.o. female who presents today for follow up.   HPI: Follow-up Mounjaro Patient is felt very well on medication; denies nausea, constipation..  She is pleased with medication would like to continue    Allergies: Sulfa antibiotics Current Outpatient Medications on File Prior to Visit  Medication Sig Dispense Refill   Ascorbic Acid (VITAMIN C) 1000 MG tablet Take 1,000 mg by mouth daily.     BIOTIN PO Take by mouth daily.      Cholecalciferol (VITAMIN D3 PO) Take 1 capsule by mouth daily. Take 1000 Iu by  mouth daily.     ezetimibe (ZETIA) 10 MG tablet TAKE 1 TABLET BY MOUTH EVERY DAY 90 tablet 3   MAGNESIUM CITRATE PO Take by mouth.     MEGARED OMEGA-3 KRILL OIL PO Take by mouth daily.      Milk Thistle 175 MG CAPS Take by mouth daily.     rosuvastatin (CRESTOR) 10 MG tablet TAKE 1 TABLET BY MOUTH EVERY DAY 30 tablet 11   VIT B12-METHIONINE-INOS-CHOL IM daily.      No current facility-administered medications on file prior to visit.     Review of Systems  Constitutional:  Negative for chills and fever.  Respiratory:  Negative for cough.   Cardiovascular:  Negative for chest pain and palpitations.  Gastrointestinal:  Negative for nausea and vomiting.      Objective:    BP 138/86   Pulse 99   Temp 97.6 F (36.4 C) (Oral)   Ht 5\' 8"  (1.727 m)   Wt 191 lb 6.4 oz (86.8 kg)   SpO2 95%   BMI 29.10 kg/m  BP Readings from Last 3 Encounters:  02/08/23 138/86  02/05/23 (!) 153/81  12/28/22 120/76   Wt Readings from Last 3 Encounters:  02/08/23 191 lb 6.4 oz (86.8 kg)  02/05/23 198 lb (89.8 kg)  12/28/22 201 lb 12.8 oz (91.5 kg)    Physical Exam Vitals reviewed.  Constitutional:      Appearance: She is well-developed.  Eyes:     Conjunctiva/sclera: Conjunctivae normal.  Cardiovascular:     Rate and Rhythm: Normal rate and regular rhythm.     Pulses: Normal pulses.     Heart sounds: Normal heart sounds.  Pulmonary:     Effort: Pulmonary effort is normal.     Breath sounds: Normal breath sounds. No wheezing, rhonchi or rales.  Skin:    General: Skin is warm and dry.  Neurological:  Mental Status: She is alert.  Psychiatric:        Speech: Speech normal.        Behavior: Behavior normal.        Thought Content: Thought content normal.

## 2023-02-08 NOTE — Assessment & Plan Note (Addendum)
Very pleased with weight loss.  Anticipate A1c to be improved.  patient and I discussed weight loss not to exceed 2 pounds per week due to safety.  She is very much in agreement with this.  Continue Mounjaro and patient will let me know if weight loss were to exceed 2 pounds per week

## 2023-02-12 ENCOUNTER — Other Ambulatory Visit: Payer: Self-pay | Admitting: Nurse Practitioner

## 2023-02-12 ENCOUNTER — Telehealth: Payer: Self-pay | Admitting: Nurse Practitioner

## 2023-02-12 DIAGNOSIS — C50811 Malignant neoplasm of overlapping sites of right female breast: Secondary | ICD-10-CM

## 2023-02-12 NOTE — Telephone Encounter (Signed)
 Spoke to patient re: results. Her preference is to monitor area and if it is enlarging, changing shape, or becoming painful, she will seek reevaluation. For now, plan for mammogram in October with surveillance visit with Dr Rennie week later. I've sent schedule message.

## 2023-02-15 ENCOUNTER — Other Ambulatory Visit: Payer: BC Managed Care – PPO

## 2023-03-01 ENCOUNTER — Other Ambulatory Visit: Payer: Self-pay | Admitting: Family

## 2023-03-06 DIAGNOSIS — H04122 Dry eye syndrome of left lacrimal gland: Secondary | ICD-10-CM | POA: Diagnosis not present

## 2023-03-06 DIAGNOSIS — H43813 Vitreous degeneration, bilateral: Secondary | ICD-10-CM | POA: Diagnosis not present

## 2023-03-06 DIAGNOSIS — R7309 Other abnormal glucose: Secondary | ICD-10-CM | POA: Diagnosis not present

## 2023-03-06 DIAGNOSIS — H35372 Puckering of macula, left eye: Secondary | ICD-10-CM | POA: Diagnosis not present

## 2023-03-06 LAB — HM DIABETES EYE EXAM

## 2023-03-13 DIAGNOSIS — G245 Blepharospasm: Secondary | ICD-10-CM | POA: Diagnosis not present

## 2023-03-13 DIAGNOSIS — G5133 Clonic hemifacial spasm, bilateral: Secondary | ICD-10-CM | POA: Diagnosis not present

## 2023-03-13 DIAGNOSIS — G510 Bell's palsy: Secondary | ICD-10-CM | POA: Diagnosis not present

## 2023-03-17 ENCOUNTER — Encounter: Payer: Self-pay | Admitting: Family

## 2023-03-18 ENCOUNTER — Other Ambulatory Visit: Payer: Self-pay

## 2023-03-18 MED ORDER — TIRZEPATIDE 5 MG/0.5ML ~~LOC~~ SOAJ: 5.0000 mg | SUBCUTANEOUS | 3 refills | Status: DC

## 2023-04-04 ENCOUNTER — Other Ambulatory Visit: Payer: Self-pay | Admitting: Family

## 2023-04-08 ENCOUNTER — Ambulatory Visit (INDEPENDENT_AMBULATORY_CARE_PROVIDER_SITE_OTHER): Payer: BC Managed Care – PPO | Admitting: Student

## 2023-04-08 VITALS — BP 111/78 | HR 106 | Ht 68.0 in | Wt 192.4 lb

## 2023-04-08 DIAGNOSIS — C50811 Malignant neoplasm of overlapping sites of right female breast: Secondary | ICD-10-CM

## 2023-04-08 DIAGNOSIS — Z170 Estrogen receptor positive status [ER+]: Secondary | ICD-10-CM

## 2023-04-08 MED ORDER — ONDANSETRON HCL 4 MG PO TABS: 4.0000 mg | ORAL_TABLET | Freq: Three times a day (TID) | ORAL | 0 refills | Status: DC | PRN

## 2023-04-08 MED ORDER — OXYCODONE HCL 5 MG PO TABS: 5.0000 mg | ORAL_TABLET | Freq: Four times a day (QID) | ORAL | 0 refills | Status: DC | PRN

## 2023-04-08 MED ORDER — CEPHALEXIN 500 MG PO CAPS: 500.0000 mg | ORAL_CAPSULE | Freq: Four times a day (QID) | ORAL | 0 refills | Status: AC

## 2023-04-08 NOTE — H&P (View-Only) (Signed)
 Patient ID: Teresa Franco, female    DOB: Jul 26, 1960, 63 y.o.   MRN: [id number]  Chief Complaint  Patient presents with   Pre-op Exam      ICD-10-CM   1. Carcinoma of overlapping sites of right breast in female, estrogen receptor positive (HCC)  C50.811    Z17.0        History of Present Illness: Teresa Franco is a 63 y.o.  female  with a history of macromastia.  She presents for preoperative evaluation for upcoming procedure, Left breast reduction with liposuction, scheduled for 04/24/23 with Dr.  Ulice Bold  The patient has not had problems with anesthesia.  Patient had a mammogram back in October 2024 which was negative.  She states that since then, she palpated firmness to her breast and had another mammogram and ultrasound done back in January 2025.  Results were BI-RADS Category 2 benign.  She denies any issues since the imaging has been done.  Patient does report personal history of right breast cancer about 20 years ago.  She states that she had chemotherapy and radiation on the right side and has not had any issues since then.  Patient denies any history of cardiac disease.  She denies taking any blood thinners.  Patient denies taking any birth control or hormone replacement.  She does report history of 1 miscarriage.  She denies any personal family history of blood clots or clotting diseases.  She denies any recent surgeries, traumas or infections.  She denies any history of stroke or heart attack.  She denies any history of Crohn's disease or ulcerative colitis, COPD or asthma.  She denies any varicosities to her lower extremities.  She denies any recent fevers, chills or changes in her health.  Patient reports she is currently a 38D cup, and her goal is to be symmetric.  Summary of Previous Visit: Patient was seen for initial consult by Dr. Ulice Bold on 11/30/2022.  At this visit, it was noted that patient was diagnosed in 2004 with right breast cancer.  Patient received  chemotherapy and right breast radiation.  Patient reported she has currently significant breast asymmetry with probably a B cup on the right and an E cup on the left.  Patient was requesting better symmetry.  Patient was found to be a good candidate for left-sided breast reduction.  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  PMH Significant for: History of breast cancer, diabetes, Bell's palsy  Per chart review, patient's most hemoglobin A1c was 6.8 in December 2024.  Patient states that she is currently on Mounjaro for her diabetes.  She reports that her next A1c will be in April.  Past Medical History: Allergies: Allergies  Allergen Reactions   Sulfa Antibiotics Rash    Current Medications:  Current Outpatient Medications:    Ascorbic Acid (VITAMIN C) 1000 MG tablet, Take 1,000 mg by mouth daily., Disp: , Rfl:    BIOTIN PO, Take by mouth daily. , Disp: , Rfl:    Cholecalciferol (VITAMIN D3 PO), Take 1 capsule by mouth daily. Take 1000 Iu by  mouth daily., Disp: , Rfl:    ezetimibe (ZETIA) 10 MG tablet, TAKE 1 TABLET BY MOUTH EVERY DAY, Disp: 30 tablet, Rfl: 11   MAGNESIUM CITRATE PO, Take by mouth., Disp: , Rfl:    MEGARED OMEGA-3 KRILL OIL PO, Take by mouth daily. , Disp: , Rfl:    rosuvastatin (CRESTOR) 10 MG tablet, TAKE 1  TABLET BY MOUTH EVERY DAY, Disp: 90 tablet, Rfl: 3   tirzepatide (MOUNJARO) 5 MG/0.5ML Pen, Inject 5 mg into the skin once a week., Disp: 6 mL, Rfl: 3   VIT B12-METHIONINE-INOS-CHOL IM, daily. , Disp: , Rfl:    Milk Thistle 175 MG CAPS, Take by mouth daily. (Patient not taking: Reported on 04/08/2023), Disp: , Rfl:   Past Medical Problems: Past Medical History:  Diagnosis Date   Breast cancer (HCC) 2004   chemo/ radiation; Her2/neu +   Dysrhythmia    palpitations   Hyperlipidemia    LGSIL on Pap smear of cervix    Personal history of chemotherapy    Personal history of radiation therapy    Prediabetes    Seasonal allergies     UTI (urinary tract infection)     Past Surgical History: Past Surgical History:  Procedure Laterality Date   BREAST BIOPSY Right 07/2002   positive   BREAST BIOPSY Right 2010   negative   BREAST LUMPECTOMY Right 08/09/2002   positive for breast cancer   BROW LIFT Bilateral 06/25/2017   Procedure: BLEPHAROPLASTY UPPER EYELID WITH EXCESS SKIN;  Surgeon: Imagene Riches, MD;  Location: [facility];  Service: Ophthalmology;  Laterality: Bilateral;   COLONOSCOPY  03/31/2010   COLONOSCOPY WITH PROPOFOL N/A 11/12/2019   Procedure: COLONOSCOPY WITH PROPOFOL;  Surgeon: Wyline Mood, MD;  Location: [facility];  Service: Gastroenterology;  Laterality: N/A;   LEEP  04/10/1993   OTHER SURGICAL HISTORY  06/2017   eyelid, took off excessive skin    Social History: Social History   Socioeconomic History   Marital status: Married    Spouse name: Not on file   Number of children: Not on file   Years of education: Not on file   Highest education level: Bachelor's degree (e.g., BA, AB, BS)  Occupational History   Not on file  Tobacco Use   Smoking status: Former    Current packs/day: 0.00    Types: Cigarettes    Quit date: 01/08/1997    Years since quitting: 26.2   Smokeless tobacco: Never  Vaping Use   Vaping status: Never Used  Substance and Sexual Activity   Alcohol use: Yes    Comment: sometimes on holidays   Drug use: No   Sexual activity: Yes    Birth control/protection: Post-menopausal  Other Topics Concern   Not on file  Social History Narrative   From [place]   Works for telecommunication    1 daughter, Freshmen at [employer] in [employer]            Social Drivers of Health   Financial Resource Strain: Low Risk  (12/23/2022)   Overall Financial Resource Strain (CARDIA)    Difficulty of Paying Living Expenses: Not hard at all  Food Insecurity: No Food Insecurity (12/23/2022)   Hunger Vital Sign    Worried About Running Out of Food in the Last Year: Never true     Ran Out of Food in the Last Year: Never true  Transportation Needs: No Transportation Needs (12/23/2022)   PRAPARE - Administrator, Civil Service (Medical): No    Lack of Transportation (Non-Medical): No  Physical Activity: Sufficiently Active (12/23/2022)   Exercise Vital Sign    Days of Exercise per Week: 6 days    Minutes of Exercise per Session: 60 min  Stress: No Stress Concern Present (12/23/2022)   [employer] of Occupational Health - Occupational Stress Questionnaire    Feeling  of Stress : Only a little  Social Connections: Moderately Isolated (12/23/2022)   Social Connection and Isolation Panel [NHANES]    Frequency of Communication with Friends and Family: More than three times a week    Frequency of Social Gatherings with Friends and Family: Twice a week    Attends Religious Services: More than 4 times per year    Active Member of [employer] or Organizations: No    Attends Engineer, structural: Not on file    Marital Status: Separated  Intimate Partner Violence: Not on file    Family History: Family History  Problem Relation Age of Onset   Hyperlipidemia Mother    Heart disease Mother    Thyroid disease Mother        no thyroid cancer   Hyperlipidemia Father    Heart disease Father 45       MI- died at 55 ? SCD   Hypertension Father    Breast cancer Maternal Aunt 50   Diabetes Maternal Aunt    Breast cancer Maternal Aunt 60   Diabetes Maternal Aunt    Colon cancer Neg Hx    Ovarian cancer Neg Hx    Melanoma Neg Hx    Thyroid cancer Neg Hx     Review of Systems: Denies any fevers, chills or recent changes in her health  Physical Exam: Vital Signs BP 111/78 (BP Location: Left Arm, Patient Position: Sitting, Cuff Size: Large)   Pulse (!) 106   Ht 5\' 8"  (1.727 m)   Wt 192 lb 6.4 oz (87.3 kg)   SpO2 98%   BMI 29.25 kg/m   Physical Exam  Constitutional:      General: Not in acute distress.    Appearance: Normal appearance. Not  ill-appearing.  HENT:     Head: Normocephalic and atraumatic.  Neck:     Musculoskeletal: Normal range of motion.  Cardiovascular:     Rate and Rhythm: Normal rate Pulmonary:     Effort: Pulmonary effort is normal. No respiratory distress.  Musculoskeletal: Normal range of motion.  Skin:    General: Skin is warm and dry.     Findings: No erythema or rash.  Neurological:     Mental Status: Alert and oriented to person, place, and time. Mental status is at baseline.  Psychiatric:        Mood and Affect: Mood normal.        Behavior: Behavior normal.    Assessment/Plan: The patient is scheduled for bilateral breast reduction with Dr. Ulice Bold.  Risks, benefits, and alternatives of procedure discussed, questions answered and consent obtained.    Smoking Status: Non-smoker; Counseling Given?  N/A Last Mammogram: 02/07/2023; Results: BI-RADS Category 2 benign  Caprini Score: 7; Risk Factors include: Age, BMI > 25, history of malignancy and length of planned surgery. Recommendation for mechanical pharmacological prophylaxis. Encourage early ambulation.   Pictures obtained: @consult   Post-op Rx sent to pharmacy: Oxycodone, Zofran, Keflex  Instructed patient to hold any multivitamins and supplements at least 1 week prior to surgery.  Discussed with patient to hold [facility] the dose before surgery.  Instructed patient to hold her ezetimibe the day of surgery.  Patient expressed understanding.  Patient was provided with the breast reduction and General Surgical Risk consent document and Pain Medication Agreement prior to their appointment.  They had adequate time to read through the risk consent documents and Pain Medication Agreement. We also discussed them in person together during this preop appointment. All of  their questions were answered to their satisfaction.  Recommended calling if they have any further questions.  Risk consent form and Pain Medication Agreement to be scanned into  patient's chart.  The risk that can be encountered with breast reduction were discussed and include the following but not limited to these:  Breast asymmetry, fluid accumulation, firmness of the breast, inability to breast feed, loss of nipple or areola, skin loss, decrease or no nipple sensation, fat necrosis of the breast tissue, bleeding, infection, healing delay.  There are risks of anesthesia, changes to skin sensation and injury to nerves or blood vessels.  The muscle can be temporarily or permanently injured.  You may have an allergic reaction to tape, suture, glue, blood products which can result in skin discoloration, swelling, pain, skin lesions, poor healing.  Any of these can lead to the need for revisonal surgery or stage procedures.  A reduction has potential to interfere with diagnostic procedures.  Nipple or breast piercing can increase risks of infection.  This procedure is best done when the breast is fully developed.  Changes in the breast will continue to occur over time.  Pregnancy can alter the outcomes of previous breast reduction surgery, weight gain and weigh loss can also effect the long term appearance.   Discussed with the patient that given her history of diabetes, this could put her at increased risk of complications with wound healing.  Discussed with patient the importance of monitoring her glucose levels throughout the perioperative period.  Patient expressed understanding.  Electronically signed by: Laurena Spies, PA-C 04/08/2023 1:40 PM

## 2023-04-08 NOTE — Progress Notes (Signed)
 Patient ID: Teresa Franco, female    DOB: Jul 26, 1960, 63 y.o.   MRN: [id number]  Chief Complaint  Patient presents with   Pre-op Exam      ICD-10-CM   1. Carcinoma of overlapping sites of right breast in female, estrogen receptor positive (HCC)  C50.811    Z17.0        History of Present Illness: Teresa Franco is a 63 y.o.  female  with a history of macromastia.  She presents for preoperative evaluation for upcoming procedure, Left breast reduction with liposuction, scheduled for 04/24/23 with Dr.  Ulice Bold  The patient has not had problems with anesthesia.  Patient had a mammogram back in October 2024 which was negative.  She states that since then, she palpated firmness to her breast and had another mammogram and ultrasound done back in January 2025.  Results were BI-RADS Category 2 benign.  She denies any issues since the imaging has been done.  Patient does report personal history of right breast cancer about 20 years ago.  She states that she had chemotherapy and radiation on the right side and has not had any issues since then.  Patient denies any history of cardiac disease.  She denies taking any blood thinners.  Patient denies taking any birth control or hormone replacement.  She does report history of 1 miscarriage.  She denies any personal family history of blood clots or clotting diseases.  She denies any recent surgeries, traumas or infections.  She denies any history of stroke or heart attack.  She denies any history of Crohn's disease or ulcerative colitis, COPD or asthma.  She denies any varicosities to her lower extremities.  She denies any recent fevers, chills or changes in her health.  Patient reports she is currently a 38D cup, and her goal is to be symmetric.  Summary of Previous Visit: Patient was seen for initial consult by Dr. Ulice Bold on 11/30/2022.  At this visit, it was noted that patient was diagnosed in 2004 with right breast cancer.  Patient received  chemotherapy and right breast radiation.  Patient reported she has currently significant breast asymmetry with probably a B cup on the right and an E cup on the left.  Patient was requesting better symmetry.  Patient was found to be a good candidate for left-sided breast reduction.  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  PMH Significant for: History of breast cancer, diabetes, Bell's palsy  Per chart review, patient's most hemoglobin A1c was 6.8 in December 2024.  Patient states that she is currently on Mounjaro for her diabetes.  She reports that her next A1c will be in April.  Past Medical History: Allergies: Allergies  Allergen Reactions   Sulfa Antibiotics Rash    Current Medications:  Current Outpatient Medications:    Ascorbic Acid (VITAMIN C) 1000 MG tablet, Take 1,000 mg by mouth daily., Disp: , Rfl:    BIOTIN PO, Take by mouth daily. , Disp: , Rfl:    Cholecalciferol (VITAMIN D3 PO), Take 1 capsule by mouth daily. Take 1000 Iu by  mouth daily., Disp: , Rfl:    ezetimibe (ZETIA) 10 MG tablet, TAKE 1 TABLET BY MOUTH EVERY DAY, Disp: 30 tablet, Rfl: 11   MAGNESIUM CITRATE PO, Take by mouth., Disp: , Rfl:    MEGARED OMEGA-3 KRILL OIL PO, Take by mouth daily. , Disp: , Rfl:    rosuvastatin (CRESTOR) 10 MG tablet, TAKE 1  TABLET BY MOUTH EVERY DAY, Disp: 90 tablet, Rfl: 3   tirzepatide (MOUNJARO) 5 MG/0.5ML Pen, Inject 5 mg into the skin once a week., Disp: 6 mL, Rfl: 3   VIT B12-METHIONINE-INOS-CHOL IM, daily. , Disp: , Rfl:    Milk Thistle 175 MG CAPS, Take by mouth daily. (Patient not taking: Reported on 04/08/2023), Disp: , Rfl:   Past Medical Problems: Past Medical History:  Diagnosis Date   Breast cancer (HCC) 2004   chemo/ radiation; Her2/neu +   Dysrhythmia    palpitations   Hyperlipidemia    LGSIL on Pap smear of cervix    Personal history of chemotherapy    Personal history of radiation therapy    Prediabetes    Seasonal allergies     UTI (urinary tract infection)     Past Surgical History: Past Surgical History:  Procedure Laterality Date   BREAST BIOPSY Right 07/2002   positive   BREAST BIOPSY Right 2010   negative   BREAST LUMPECTOMY Right 08/09/2002   positive for breast cancer   BROW LIFT Bilateral 06/25/2017   Procedure: BLEPHAROPLASTY UPPER EYELID WITH EXCESS SKIN;  Surgeon: Imagene Riches, MD;  Location: [facility];  Service: Ophthalmology;  Laterality: Bilateral;   COLONOSCOPY  03/31/2010   COLONOSCOPY WITH PROPOFOL N/A 11/12/2019   Procedure: COLONOSCOPY WITH PROPOFOL;  Surgeon: Wyline Mood, MD;  Location: [facility];  Service: Gastroenterology;  Laterality: N/A;   LEEP  04/10/1993   OTHER SURGICAL HISTORY  06/2017   eyelid, took off excessive skin    Social History: Social History   Socioeconomic History   Marital status: Married    Spouse name: Not on file   Number of children: Not on file   Years of education: Not on file   Highest education level: Bachelor's degree (e.g., BA, AB, BS)  Occupational History   Not on file  Tobacco Use   Smoking status: Former    Current packs/day: 0.00    Types: Cigarettes    Quit date: 01/08/1997    Years since quitting: 26.2   Smokeless tobacco: Never  Vaping Use   Vaping status: Never Used  Substance and Sexual Activity   Alcohol use: Yes    Comment: sometimes on holidays   Drug use: No   Sexual activity: Yes    Birth control/protection: Post-menopausal  Other Topics Concern   Not on file  Social History Narrative   From [place]   Works for telecommunication    1 daughter, Freshmen at [employer] in [employer]            Social Drivers of Health   Financial Resource Strain: Low Risk  (12/23/2022)   Overall Financial Resource Strain (CARDIA)    Difficulty of Paying Living Expenses: Not hard at all  Food Insecurity: No Food Insecurity (12/23/2022)   Hunger Vital Sign    Worried About Running Out of Food in the Last Year: Never true     Ran Out of Food in the Last Year: Never true  Transportation Needs: No Transportation Needs (12/23/2022)   PRAPARE - Administrator, Civil Service (Medical): No    Lack of Transportation (Non-Medical): No  Physical Activity: Sufficiently Active (12/23/2022)   Exercise Vital Sign    Days of Exercise per Week: 6 days    Minutes of Exercise per Session: 60 min  Stress: No Stress Concern Present (12/23/2022)   [employer] of Occupational Health - Occupational Stress Questionnaire    Feeling  of Stress : Only a little  Social Connections: Moderately Isolated (12/23/2022)   Social Connection and Isolation Panel [NHANES]    Frequency of Communication with Friends and Family: More than three times a week    Frequency of Social Gatherings with Friends and Family: Twice a week    Attends Religious Services: More than 4 times per year    Active Member of [employer] or Organizations: No    Attends Engineer, structural: Not on file    Marital Status: Separated  Intimate Partner Violence: Not on file    Family History: Family History  Problem Relation Age of Onset   Hyperlipidemia Mother    Heart disease Mother    Thyroid disease Mother        no thyroid cancer   Hyperlipidemia Father    Heart disease Father 45       MI- died at 55 ? SCD   Hypertension Father    Breast cancer Maternal Aunt 50   Diabetes Maternal Aunt    Breast cancer Maternal Aunt 60   Diabetes Maternal Aunt    Colon cancer Neg Hx    Ovarian cancer Neg Hx    Melanoma Neg Hx    Thyroid cancer Neg Hx     Review of Systems: Denies any fevers, chills or recent changes in her health  Physical Exam: Vital Signs BP 111/78 (BP Location: Left Arm, Patient Position: Sitting, Cuff Size: Large)   Pulse (!) 106   Ht 5\' 8"  (1.727 m)   Wt 192 lb 6.4 oz (87.3 kg)   SpO2 98%   BMI 29.25 kg/m   Physical Exam  Constitutional:      General: Not in acute distress.    Appearance: Normal appearance. Not  ill-appearing.  HENT:     Head: Normocephalic and atraumatic.  Neck:     Musculoskeletal: Normal range of motion.  Cardiovascular:     Rate and Rhythm: Normal rate Pulmonary:     Effort: Pulmonary effort is normal. No respiratory distress.  Musculoskeletal: Normal range of motion.  Skin:    General: Skin is warm and dry.     Findings: No erythema or rash.  Neurological:     Mental Status: Alert and oriented to person, place, and time. Mental status is at baseline.  Psychiatric:        Mood and Affect: Mood normal.        Behavior: Behavior normal.    Assessment/Plan: The patient is scheduled for bilateral breast reduction with Dr. Ulice Bold.  Risks, benefits, and alternatives of procedure discussed, questions answered and consent obtained.    Smoking Status: Non-smoker; Counseling Given?  N/A Last Mammogram: 02/07/2023; Results: BI-RADS Category 2 benign  Caprini Score: 7; Risk Factors include: Age, BMI > 25, history of malignancy and length of planned surgery. Recommendation for mechanical pharmacological prophylaxis. Encourage early ambulation.   Pictures obtained: @consult   Post-op Rx sent to pharmacy: Oxycodone, Zofran, Keflex  Instructed patient to hold any multivitamins and supplements at least 1 week prior to surgery.  Discussed with patient to hold [facility] the dose before surgery.  Instructed patient to hold her ezetimibe the day of surgery.  Patient expressed understanding.  Patient was provided with the breast reduction and General Surgical Risk consent document and Pain Medication Agreement prior to their appointment.  They had adequate time to read through the risk consent documents and Pain Medication Agreement. We also discussed them in person together during this preop appointment. All of  their questions were answered to their satisfaction.  Recommended calling if they have any further questions.  Risk consent form and Pain Medication Agreement to be scanned into  patient's chart.  The risk that can be encountered with breast reduction were discussed and include the following but not limited to these:  Breast asymmetry, fluid accumulation, firmness of the breast, inability to breast feed, loss of nipple or areola, skin loss, decrease or no nipple sensation, fat necrosis of the breast tissue, bleeding, infection, healing delay.  There are risks of anesthesia, changes to skin sensation and injury to nerves or blood vessels.  The muscle can be temporarily or permanently injured.  You may have an allergic reaction to tape, suture, glue, blood products which can result in skin discoloration, swelling, pain, skin lesions, poor healing.  Any of these can lead to the need for revisonal surgery or stage procedures.  A reduction has potential to interfere with diagnostic procedures.  Nipple or breast piercing can increase risks of infection.  This procedure is best done when the breast is fully developed.  Changes in the breast will continue to occur over time.  Pregnancy can alter the outcomes of previous breast reduction surgery, weight gain and weigh loss can also effect the long term appearance.   Discussed with the patient that given her history of diabetes, this could put her at increased risk of complications with wound healing.  Discussed with patient the importance of monitoring her glucose levels throughout the perioperative period.  Patient expressed understanding.  Electronically signed by: Laurena Spies, PA-C 04/08/2023 1:40 PM

## 2023-04-11 ENCOUNTER — Telehealth: Payer: Self-pay | Admitting: Student

## 2023-04-11 NOTE — Telephone Encounter (Signed)
 Spoke with Dr. Ulice Bold in regards to patient's A1c.  She would like to see if the patient's A1c can be checked prior to surgery.  I called the patient and discussed this with her.  She states that she will call her PCP to see if the test can be done prior to surgery.  Patient will keep [place] updated.

## 2023-04-12 ENCOUNTER — Encounter: Payer: Self-pay | Admitting: Family

## 2023-04-12 ENCOUNTER — Ambulatory Visit (INDEPENDENT_AMBULATORY_CARE_PROVIDER_SITE_OTHER): Admitting: Family

## 2023-04-12 VITALS — BP 128/78 | HR 87 | Temp 97.7°F | Ht 68.0 in | Wt 191.2 lb

## 2023-04-12 DIAGNOSIS — E119 Type 2 diabetes mellitus without complications: Secondary | ICD-10-CM | POA: Diagnosis not present

## 2023-04-12 DIAGNOSIS — Z7985 Long-term (current) use of injectable non-insulin antidiabetic drugs: Secondary | ICD-10-CM

## 2023-04-12 DIAGNOSIS — R7309 Other abnormal glucose: Secondary | ICD-10-CM | POA: Diagnosis not present

## 2023-04-12 DIAGNOSIS — H1045 Other chronic allergic conjunctivitis: Secondary | ICD-10-CM | POA: Diagnosis not present

## 2023-04-12 DIAGNOSIS — H43813 Vitreous degeneration, bilateral: Secondary | ICD-10-CM | POA: Diagnosis not present

## 2023-04-12 DIAGNOSIS — H04122 Dry eye syndrome of left lacrimal gland: Secondary | ICD-10-CM | POA: Diagnosis not present

## 2023-04-12 LAB — POCT GLYCOSYLATED HEMOGLOBIN (HGB A1C): Hemoglobin A1C: 5.8 % — AB (ref 4.0–5.6)

## 2023-04-12 NOTE — Progress Notes (Signed)
 Assessment & Plan:  Diabetes mellitus without complication (HCC) Assessment & Plan: Chronic, excellent control.  Continue Mounjaro 5 mg.  Pending A1c in 3 months time  Orders: -     Hemoglobin A1c; Future  Elevated glucose -     POCT glycosylated hemoglobin (Hb A1C) -     Hemoglobin A1c; Future     Return precautions given.   Risks, benefits, and alternatives of the medications and treatment plan prescribed today were discussed, and patient expressed understanding.   Education regarding symptom management and diagnosis given to patient on AVS either electronically or printed.  No follow-ups on file.  [name], FNP  Subjective:    Patient ID: Teresa Franco, female    DOB: Apr 15, 1960, 63 y.o.   MRN: [id number]  CC: Teresa Franco is a 63 y.o. female who presents today for DM follow up.   HPI: Feels well today.  She has done quite well on Mounjaro 2.5 mg.  This week she increased to 5 mg.  Denies nausea, vomiting, constipation or abdominal pain.   Following with [facility]NT for facial parralysis, Bells palsy, last seen 03/13/23 for botox injections  Allergies: Sulfa antibiotics Current Outpatient Medications on File Prior to Visit  Medication Sig Dispense Refill   Ascorbic Acid (VITAMIN C) 1000 MG tablet Take 1,000 mg by mouth daily.     BIOTIN PO Take by mouth daily.      Cholecalciferol (VITAMIN D3 PO) Take 1 capsule by mouth daily. Take 1000 Iu by  mouth daily.     ezetimibe (ZETIA) 10 MG tablet TAKE 1 TABLET BY MOUTH EVERY DAY 30 tablet 11   MAGNESIUM CITRATE PO Take by mouth.     MEGARED OMEGA-3 KRILL OIL PO Take by mouth daily.      ondansetron (ZOFRAN) 4 MG tablet Take 1 tablet (4 mg total) by mouth every 8 (eight) hours as needed for up to 20 doses for nausea or vomiting. 20 tablet 0   oxyCODONE (ROXICODONE) 5 MG immediate release tablet Take 1 tablet (5 mg total) by mouth every 6 (six) hours as needed for up to 20 doses for severe pain (pain score 7-10). 20  tablet 0   rosuvastatin (CRESTOR) 10 MG tablet TAKE 1 TABLET BY MOUTH EVERY DAY 90 tablet 3   tirzepatide (MOUNJARO) 5 MG/0.5ML Pen Inject 5 mg into the skin once a week. 6 mL 3   VIT B12-METHIONINE-INOS-CHOL IM daily.      Milk Thistle 175 MG CAPS Take by mouth daily. (Patient not taking: Reported on 04/12/2023)     No current facility-administered medications on file prior to visit.    Review of Systems  Constitutional:  Negative for chills and fever.  Respiratory:  Negative for cough.   Cardiovascular:  Negative for chest pain and palpitations.  Gastrointestinal:  Negative for nausea and vomiting.      Objective:    BP 128/78   Pulse 87   Temp 97.7 F (36.5 C) (Oral)   Ht 5\' 8"  (1.727 m)   Wt 191 lb 3.2 oz (86.7 kg)   SpO2 97%   BMI 29.07 kg/m  BP Readings from Last 3 Encounters:  04/12/23 128/78  04/08/23 111/78  02/08/23 138/86   Wt Readings from Last 3 Encounters:  04/12/23 191 lb 3.2 oz (86.7 kg)  04/08/23 192 lb 6.4 oz (87.3 kg)  02/08/23 191 lb 6.4 oz (86.8 kg)    Physical Exam Vitals reviewed.  Constitutional:  Appearance: She is well-developed.  Eyes:     Conjunctiva/sclera: Conjunctivae normal.  Cardiovascular:     Rate and Rhythm: Normal rate and regular rhythm.     Pulses: Normal pulses.     Heart sounds: Normal heart sounds.  Pulmonary:     Effort: Pulmonary effort is normal.     Breath sounds: Normal breath sounds. No wheezing, rhonchi or rales.  Skin:    General: Skin is warm and dry.  Neurological:     Mental Status: She is alert.  Psychiatric:        Speech: Speech normal.        Behavior: Behavior normal.        Thought Content: Thought content normal.

## 2023-04-12 NOTE — Assessment & Plan Note (Signed)
 Chronic, excellent control.  Continue Mounjaro 5 mg.  Pending A1c in 3 months time

## 2023-04-17 ENCOUNTER — Other Ambulatory Visit: Payer: Self-pay

## 2023-04-17 ENCOUNTER — Encounter (HOSPITAL_BASED_OUTPATIENT_CLINIC_OR_DEPARTMENT_OTHER): Payer: Self-pay | Admitting: Plastic Surgery

## 2023-04-18 ENCOUNTER — Encounter (HOSPITAL_BASED_OUTPATIENT_CLINIC_OR_DEPARTMENT_OTHER)
Admission: RE | Admit: 2023-04-18 | Discharge: 2023-04-18 | Disposition: A | Discharge status: Home / Self Care | Source: Ambulatory Visit | Attending: Plastic Surgery | Admitting: Plastic Surgery

## 2023-04-18 DIAGNOSIS — Z01818 Encounter for other preprocedural examination: Secondary | ICD-10-CM | POA: Insufficient documentation

## 2023-04-18 DIAGNOSIS — Z01810 Encounter for preprocedural cardiovascular examination: Secondary | ICD-10-CM | POA: Diagnosis not present

## 2023-04-18 DIAGNOSIS — E119 Type 2 diabetes mellitus without complications: Secondary | ICD-10-CM | POA: Insufficient documentation

## 2023-04-18 DIAGNOSIS — Z01812 Encounter for preprocedural laboratory examination: Secondary | ICD-10-CM | POA: Diagnosis not present

## 2023-04-18 LAB — BASIC METABOLIC PANEL WITH GFR
Anion gap: 8 (ref 5–15)
BUN: 15 mg/dL (ref 8–23)
CO2: 25 mmol/L (ref 22–32)
Calcium: 9.6 mg/dL (ref 8.9–10.3)
Chloride: 105 mmol/L (ref 98–111)
Creatinine, Ser: 0.9 mg/dL (ref 0.44–1.00)
GFR, Estimated: >60 mL/min (ref >60)
Glucose, Bld: 103 mg/dL — ABNORMAL HIGH (ref 70–99)
Potassium: 4.5 mmol/L (ref 3.5–5.1)
Sodium: 138 mmol/L (ref 135–145)

## 2023-04-23 ENCOUNTER — Encounter (HOSPITAL_BASED_OUTPATIENT_CLINIC_OR_DEPARTMENT_OTHER): Payer: Self-pay | Admitting: Plastic Surgery

## 2023-04-23 NOTE — Anesthesia Preprocedure Evaluation (Signed)
 Anesthesia Evaluation  Patient identified by MRN, date of birth, ID band Patient awake    Reviewed: Allergy & Precautions, NPO status , Patient's Chart, lab work & pertinent test results  Airway Mallampati: II  TM Distance: >3 FB     Dental  (+) Teeth Intact, Dental Advisory Given, Caps   Pulmonary former smoker   Pulmonary exam normal breath sounds clear to auscultation       Cardiovascular + CAD  Normal cardiovascular exam+ dysrhythmias  Rhythm:Regular Rate:Normal  Hx/o tachycardia  EKG 04/18/23 NSR, otherwise normal   Neuro/Psych Hx/o Bell's palsy left  Neuromuscular disease  negative psych ROS   GI/Hepatic negative GI ROS,,,(+) Hepatitis -, UnspecifiedHx/o NASH   Endo/Other  diabetes, Well Controlled, Type 2  GLP-1 RA therapy- last dose 4/6 Hyperlipidemia Hx/o right breast Ca S/P lumpectomy, chemoRx and RT  Renal/GU negative Renal ROS  negative genitourinary   Musculoskeletal negative musculoskeletal ROS (+)    Abdominal   Peds  Hematology negative hematology ROS (+)   Anesthesia Other Findings   Reproductive/Obstetrics                              Anesthesia Physical Anesthesia Plan  ASA: 2  Anesthesia Plan: General   Post-op Pain Management: Dilaudid IV, Precedex and Tylenol PO (pre-op)*   Induction: Intravenous  PONV Risk Score and Plan: 4 or greater and Treatment may vary due to age or medical condition, Midazolam, Ondansetron and Dexamethasone  Airway Management Planned: Oral ETT  Additional Equipment: None  Intra-op Plan:   Post-operative Plan: Extubation in OR  Informed Consent: I have reviewed the patients History and Physical, chart, labs and discussed the procedure including the risks, benefits and alternatives for the proposed anesthesia with the patient or authorized representative who has indicated his/her understanding and acceptance.     Dental  advisory given  Plan Discussed with: Anesthesiologist and CRNA  Anesthesia Plan Comments:          Anesthesia Quick Evaluation

## 2023-04-24 ENCOUNTER — Other Ambulatory Visit: Payer: Self-pay

## 2023-04-24 ENCOUNTER — Encounter (HOSPITAL_BASED_OUTPATIENT_CLINIC_OR_DEPARTMENT_OTHER)
Admission: RE | Disposition: A | Discharge status: Home / Self Care | Payer: Self-pay | Source: Home / Self Care | Attending: Plastic Surgery

## 2023-04-24 ENCOUNTER — Ambulatory Visit (HOSPITAL_COMMUNITY)
Admission: RE | Admit: 2023-04-24 | Discharge: 2023-04-24 | Disposition: A | Discharge status: Home / Self Care | Payer: BC Managed Care – PPO | Attending: Plastic Surgery | Admitting: Plastic Surgery

## 2023-04-24 ENCOUNTER — Ambulatory Visit (HOSPITAL_BASED_OUTPATIENT_CLINIC_OR_DEPARTMENT_OTHER): Admitting: Anesthesiology

## 2023-04-24 ENCOUNTER — Encounter (HOSPITAL_BASED_OUTPATIENT_CLINIC_OR_DEPARTMENT_OTHER): Payer: Self-pay | Admitting: Plastic Surgery

## 2023-04-24 DIAGNOSIS — C50912 Malignant neoplasm of unspecified site of left female breast: Secondary | ICD-10-CM | POA: Diagnosis not present

## 2023-04-24 DIAGNOSIS — Z87891 Personal history of nicotine dependence: Secondary | ICD-10-CM | POA: Insufficient documentation

## 2023-04-24 DIAGNOSIS — E119 Type 2 diabetes mellitus without complications: Secondary | ICD-10-CM | POA: Insufficient documentation

## 2023-04-24 DIAGNOSIS — C50811 Malignant neoplasm of overlapping sites of right female breast: Secondary | ICD-10-CM | POA: Diagnosis not present

## 2023-04-24 DIAGNOSIS — I2510 Atherosclerotic heart disease of native coronary artery without angina pectoris: Secondary | ICD-10-CM | POA: Insufficient documentation

## 2023-04-24 DIAGNOSIS — Z923 Personal history of irradiation: Secondary | ICD-10-CM | POA: Diagnosis not present

## 2023-04-24 DIAGNOSIS — Z7985 Long-term (current) use of injectable non-insulin antidiabetic drugs: Secondary | ICD-10-CM | POA: Diagnosis not present

## 2023-04-24 DIAGNOSIS — Z853 Personal history of malignant neoplasm of breast: Secondary | ICD-10-CM | POA: Diagnosis not present

## 2023-04-24 DIAGNOSIS — Z170 Estrogen receptor positive status [ER+]: Secondary | ICD-10-CM | POA: Diagnosis not present

## 2023-04-24 DIAGNOSIS — N651 Disproportion of reconstructed breast: Secondary | ICD-10-CM

## 2023-04-24 DIAGNOSIS — N62 Hypertrophy of breast: Secondary | ICD-10-CM | POA: Diagnosis not present

## 2023-04-24 DIAGNOSIS — Z9221 Personal history of antineoplastic chemotherapy: Secondary | ICD-10-CM | POA: Insufficient documentation

## 2023-04-24 HISTORY — PX: BREAST REDUCTION SURGERY: SHX8

## 2023-04-24 HISTORY — DX: Type 2 diabetes mellitus without complications: E11.9

## 2023-04-24 LAB — GLUCOSE, CAPILLARY
Glucose-Capillary: 113 mg/dL — ABNORMAL HIGH (ref 70–99)
Glucose-Capillary: 110 mg/dL — ABNORMAL HIGH (ref 70–99)

## 2023-04-24 SURGERY — BREAST REDUCTION WITH LIPOSUCTION
Anesthesia: General | Site: Breast | Laterality: Left

## 2023-04-24 MED ORDER — FENTANYL CITRATE (PF) 100 MCG/2ML IJ SOLN
INTRAMUSCULAR | Status: AC
Start: 2023-04-24 — End: ?
  Filled 2023-04-24: qty 2

## 2023-04-24 MED ORDER — BUPIVACAINE LIPOSOME 1.3 % IJ SUSP
INTRAMUSCULAR | Status: DC | PRN
  Administered 2023-04-24: 20 mL

## 2023-04-24 MED ORDER — PHENYLEPHRINE HCL (PRESSORS) 10 MG/ML IV SOLN
INTRAVENOUS | Status: DC | PRN
  Administered 2023-04-24: 80 ug via INTRAVENOUS

## 2023-04-24 MED ORDER — ONDANSETRON HCL 4 MG/2ML IJ SOLN
INTRAMUSCULAR | Status: DC | PRN
  Administered 2023-04-24: 4 mg via INTRAVENOUS

## 2023-04-24 MED ORDER — BUPIVACAINE-EPINEPHRINE (PF) 0.25% -1:200000 IJ SOLN
INTRAMUSCULAR | Status: AC
Start: 2023-04-24 — End: ?
  Filled 2023-04-24: qty 30

## 2023-04-24 MED ORDER — ACETAMINOPHEN 325 MG RE SUPP: 650.0000 mg | RECTAL | Status: DC | PRN

## 2023-04-24 MED ORDER — SUCCINYLCHOLINE CHLORIDE 200 MG/10ML IV SOSY
PREFILLED_SYRINGE | INTRAVENOUS | Status: AC
  Filled 2023-04-24: qty 10

## 2023-04-24 MED ORDER — SODIUM CHLORIDE 0.9 % IV SOLN: 250.0000 mL | INTRAVENOUS | Status: DC | PRN

## 2023-04-24 MED ORDER — ROCURONIUM BROMIDE 10 MG/ML (PF) SYRINGE
PREFILLED_SYRINGE | INTRAVENOUS | Status: AC
  Filled 2023-04-24: qty 10

## 2023-04-24 MED ORDER — LIDOCAINE HCL (CARDIAC) PF 100 MG/5ML IV SOSY
PREFILLED_SYRINGE | INTRAVENOUS | Status: DC | PRN
  Administered 2023-04-24: 60 mg via INTRAVENOUS

## 2023-04-24 MED ORDER — ACETAMINOPHEN 500 MG PO TABS
1000.0000 mg | ORAL_TABLET | Freq: Once | ORAL | Status: AC
  Administered 2023-04-24: 1000 mg via ORAL

## 2023-04-24 MED ORDER — PHENYLEPHRINE 80 MCG/ML (10ML) SYRINGE FOR IV PUSH (FOR BLOOD PRESSURE SUPPORT)
PREFILLED_SYRINGE | INTRAVENOUS | Status: AC
  Filled 2023-04-24: qty 10

## 2023-04-24 MED ORDER — VASHE WOUND IRRIGATION OPTIME
TOPICAL | Status: DC | PRN
  Administered 2023-04-24: 34 [oz_av]

## 2023-04-24 MED ORDER — HYDROMORPHONE HCL 1 MG/ML IJ SOLN: 0.2500 mg | INTRAMUSCULAR | Status: DC | PRN

## 2023-04-24 MED ORDER — ACETAMINOPHEN 500 MG PO TABS
ORAL_TABLET | ORAL | Status: AC
  Filled 2023-04-24: qty 2

## 2023-04-24 MED ORDER — OXYCODONE HCL 5 MG PO TABS
5.0000 mg | ORAL_TABLET | Freq: Once | ORAL | Status: AC | PRN
  Administered 2023-04-24: 5 mg via ORAL

## 2023-04-24 MED ORDER — LIDOCAINE HCL 1 % IJ SOLN
INTRAVENOUS | Status: DC | PRN
  Administered 2023-04-24: 150 mL

## 2023-04-24 MED ORDER — CEFAZOLIN SODIUM-DEXTROSE 2-4 GM/100ML-% IV SOLN
INTRAVENOUS | Status: AC
  Filled 2023-04-24: qty 100

## 2023-04-24 MED ORDER — BUPIVACAINE LIPOSOME 1.3 % IJ SUSP
INTRAMUSCULAR | Status: AC
  Filled 2023-04-24: qty 10

## 2023-04-24 MED ORDER — SODIUM CHLORIDE 0.9% FLUSH: 3.0000 mL | INTRAVENOUS | Status: DC | PRN

## 2023-04-24 MED ORDER — EPHEDRINE 5 MG/ML INJ
INTRAVENOUS | Status: AC
  Filled 2023-04-24: qty 5

## 2023-04-24 MED ORDER — CEFAZOLIN SODIUM-DEXTROSE 2-4 GM/100ML-% IV SOLN
2.0000 g | INTRAVENOUS | Status: AC
  Administered 2023-04-24: 2 g via INTRAVENOUS

## 2023-04-24 MED ORDER — LACTATED RINGERS IV SOLN: INTRAVENOUS | Status: DC

## 2023-04-24 MED ORDER — CHLORHEXIDINE GLUCONATE CLOTH 2 % EX PADS
6.0000 | MEDICATED_PAD | Freq: Once | CUTANEOUS | Status: DC
Start: 2023-04-24 — End: 2023-04-24

## 2023-04-24 MED ORDER — LIDOCAINE-EPINEPHRINE 1 %-1:100000 IJ SOLN
INTRAMUSCULAR | Status: AC
  Filled 2023-04-24: qty 2

## 2023-04-24 MED ORDER — PROPOFOL 10 MG/ML IV BOLUS
INTRAVENOUS | Status: DC | PRN
  Administered 2023-04-24: 150 mg via INTRAVENOUS

## 2023-04-24 MED ORDER — BUPIVACAINE HCL (PF) 0.25 % IJ SOLN
INTRAMUSCULAR | Status: AC
  Filled 2023-04-24: qty 60

## 2023-04-24 MED ORDER — ONDANSETRON HCL 4 MG/2ML IJ SOLN: 4.0000 mg | Freq: Once | INTRAMUSCULAR | Status: DC | PRN

## 2023-04-24 MED ORDER — OXYCODONE HCL 5 MG/5ML PO SOLN: 5.0000 mg | Freq: Once | ORAL | Status: AC | PRN

## 2023-04-24 MED ORDER — MIDAZOLAM HCL 5 MG/5ML IJ SOLN
INTRAMUSCULAR | Status: DC | PRN
  Administered 2023-04-24: 2 mg via INTRAVENOUS

## 2023-04-24 MED ORDER — FENTANYL CITRATE (PF) 100 MCG/2ML IJ SOLN: 25.0000 ug | INTRAMUSCULAR | Status: DC | PRN

## 2023-04-24 MED ORDER — LIDOCAINE HCL (PF) 1 % IJ SOLN
INTRAMUSCULAR | Status: AC
  Filled 2023-04-24: qty 60

## 2023-04-24 MED ORDER — ATROPINE SULFATE 0.4 MG/ML IV SOLN
INTRAVENOUS | Status: AC
  Filled 2023-04-24: qty 1

## 2023-04-24 MED ORDER — LIDOCAINE 2% (20 MG/ML) 5 ML SYRINGE
INTRAMUSCULAR | Status: AC
  Filled 2023-04-24: qty 5

## 2023-04-24 MED ORDER — ONDANSETRON HCL 4 MG/2ML IJ SOLN
INTRAMUSCULAR | Status: AC
  Filled 2023-04-24: qty 2

## 2023-04-24 MED ORDER — DEXAMETHASONE SODIUM PHOSPHATE 10 MG/ML IJ SOLN
INTRAMUSCULAR | Status: AC
  Filled 2023-04-24: qty 1

## 2023-04-24 MED ORDER — FENTANYL CITRATE (PF) 100 MCG/2ML IJ SOLN
INTRAMUSCULAR | Status: DC | PRN
Start: 2023-04-24 — End: 2023-04-24
  Administered 2023-04-24 (×2): 50 ug via INTRAVENOUS

## 2023-04-24 MED ORDER — LIDOCAINE-EPINEPHRINE 1 %-1:100000 IJ SOLN
INTRAMUSCULAR | Status: DC | PRN
  Administered 2023-04-24: 15 mL

## 2023-04-24 MED ORDER — EPINEPHRINE PF 1 MG/ML IJ SOLN
INTRAMUSCULAR | Status: AC
  Filled 2023-04-24: qty 1

## 2023-04-24 MED ORDER — SODIUM CHLORIDE (PF) 0.9 % IJ SOLN
INTRAMUSCULAR | Status: AC
  Filled 2023-04-24: qty 20

## 2023-04-24 MED ORDER — SODIUM CHLORIDE 0.9% FLUSH: 3.0000 mL | Freq: Two times a day (BID) | INTRAVENOUS | Status: DC

## 2023-04-24 MED ORDER — DEXAMETHASONE SODIUM PHOSPHATE 4 MG/ML IJ SOLN
INTRAMUSCULAR | Status: DC | PRN
  Administered 2023-04-24: 5 mg via INTRAVENOUS

## 2023-04-24 MED ORDER — DEXMEDETOMIDINE HCL IN NACL 80 MCG/20ML IV SOLN
INTRAVENOUS | Status: DC | PRN
Start: 2023-04-24 — End: 2023-04-24
  Administered 2023-04-24: 12 ug via INTRAVENOUS

## 2023-04-24 MED ORDER — ACETAMINOPHEN 325 MG PO TABS: 650.0000 mg | ORAL_TABLET | ORAL | Status: DC | PRN

## 2023-04-24 MED ORDER — DEXMEDETOMIDINE HCL IN NACL 80 MCG/20ML IV SOLN
INTRAVENOUS | Status: AC
  Filled 2023-04-24: qty 20

## 2023-04-24 MED ORDER — OXYCODONE HCL 5 MG PO TABS: 5.0000 mg | ORAL_TABLET | ORAL | Status: DC | PRN

## 2023-04-24 MED ORDER — BUPIVACAINE LIPOSOME 1.3 % IJ SUSP
INTRAMUSCULAR | Status: AC
  Filled 2023-04-24: qty 20

## 2023-04-24 MED ORDER — OXYCODONE HCL 5 MG PO TABS
ORAL_TABLET | ORAL | Status: AC
Start: 2023-04-24 — End: ?
  Filled 2023-04-24: qty 1

## 2023-04-24 MED ORDER — MIDAZOLAM HCL 2 MG/2ML IJ SOLN
INTRAMUSCULAR | Status: AC
Start: 2023-04-24 — End: ?
  Filled 2023-04-24: qty 2

## 2023-04-24 SURGICAL SUPPLY — 64 items
BINDER BREAST LRG (GAUZE/BANDAGES/DRESSINGS) IMPLANT
BINDER BREAST MEDIUM (GAUZE/BANDAGES/DRESSINGS) IMPLANT
BINDER BREAST XLRG (GAUZE/BANDAGES/DRESSINGS) IMPLANT
BINDER BREAST XXLRG (GAUZE/BANDAGES/DRESSINGS) IMPLANT
BIOPATCH RED 1 DISK 7.0 (GAUZE/BANDAGES/DRESSINGS) IMPLANT
BLADE HEX COATED 2.75 (ELECTRODE) IMPLANT
BLADE KNIFE PERSONA 10 (BLADE) ×2 IMPLANT
BLADE SURG 15 STRL LF DISP TIS (BLADE) ×1 IMPLANT
CANISTER SUCT 1200ML W/VALVE (MISCELLANEOUS) ×1 IMPLANT
CLEANSER WND VASHE 34 (WOUND CARE) ×1 IMPLANT
COVER BACK TABLE 60X90IN (DRAPES) ×1 IMPLANT
COVER MAYO STAND STRL (DRAPES) ×1 IMPLANT
DERMABOND ADVANCED .7 DNX12 (GAUZE/BANDAGES/DRESSINGS) ×2 IMPLANT
DRAIN CHANNEL 19F RND (DRAIN) IMPLANT
DRAPE LAPAROSCOPIC ABDOMINAL (DRAPES) ×1 IMPLANT
DRAPE UTILITY XL STRL (DRAPES) ×1 IMPLANT
DRSG MEPILEX POST OP 4X8 (GAUZE/BANDAGES/DRESSINGS) ×2 IMPLANT
DRSG TEGADERM 4X4.75 (GAUZE/BANDAGES/DRESSINGS) IMPLANT
ELECT BLADE 4.0 EZ CLEAN MEGAD (MISCELLANEOUS) ×1 IMPLANT
ELECT REM PT RETURN 9FT ADLT (ELECTROSURGICAL) ×1 IMPLANT
ELECTRODE BLDE 4.0 EZ CLN MEGD (MISCELLANEOUS) ×1 IMPLANT
ELECTRODE REM PT RTRN 9FT ADLT (ELECTROSURGICAL) ×1 IMPLANT
EVACUATOR SILICONE 100CC (DRAIN) IMPLANT
GAUZE PAD ABD 8X10 STRL (GAUZE/BANDAGES/DRESSINGS) ×2 IMPLANT
GLOVE BIO SURGEON STRL SZ 6.5 (GLOVE) ×2 IMPLANT
GLOVE BIO SURGEON STRL SZ7.5 (GLOVE) ×1 IMPLANT
GLOVE BIOGEL PI IND STRL 7.0 (GLOVE) IMPLANT
GLOVE BIOGEL PI IND STRL 8 (GLOVE) IMPLANT
GOWN STRL REUS W/ TWL LRG LVL3 (GOWN DISPOSABLE) ×2 IMPLANT
GOWN STRL REUS W/ TWL XL LVL3 (GOWN DISPOSABLE) IMPLANT
LINER CANISTER 1000CC FLEX (MISCELLANEOUS) ×1 IMPLANT
NDL FILTER BLUNT 18X1 1/2 (NEEDLE) IMPLANT
NDL HYPO 25X1 1.5 SAFETY (NEEDLE) ×2 IMPLANT
NEEDLE FILTER BLUNT 18X1 1/2 (NEEDLE) ×1 IMPLANT
NEEDLE HYPO 25X1 1.5 SAFETY (NEEDLE) ×2 IMPLANT
NS IRRIG 1000ML POUR BTL (IV SOLUTION) IMPLANT
PACK BASIN DAY SURGERY FS (CUSTOM PROCEDURE TRAY) ×1 IMPLANT
PAD ALCOHOL SWAB (MISCELLANEOUS) IMPLANT
PAD FOAM SILICONE BACKED (GAUZE/BANDAGES/DRESSINGS) IMPLANT
PENCIL SMOKE EVACUATOR (MISCELLANEOUS) ×1 IMPLANT
PIN SAFETY STERILE (MISCELLANEOUS) IMPLANT
POWDER MYRIAD MORCLLS FINE 500 (Miscellaneous) IMPLANT
PWDR MYRIAD MORCELLS FINE 500 (Miscellaneous) ×1 IMPLANT
SLEEVE SCD COMPRESS KNEE MED (STOCKING) ×1 IMPLANT
SPIKE FLUID TRANSFER (MISCELLANEOUS) IMPLANT
SPONGE T-LAP 18X18 ~~LOC~~+RFID (SPONGE) ×2 IMPLANT
STRIP SUTURE WOUND CLOSURE 1/2 (MISCELLANEOUS) ×4 IMPLANT
SUT MNCRL AB 4-0 PS2 18 (SUTURE) ×4 IMPLANT
SUT MON AB 3-0 SH27 (SUTURE) ×4 IMPLANT
SUT MON AB 5-0 PS2 18 (SUTURE) IMPLANT
SUT PDS 3-0 CT2 (SUTURE) ×2 IMPLANT
SUT PDS II 3-0 CT2 27 ABS (SUTURE) ×4 IMPLANT
SUT SILK 3 0 PS 1 (SUTURE) IMPLANT
SYR 50ML LL SCALE MARK (SYRINGE) IMPLANT
SYR BULB IRRIG 60ML STRL (SYRINGE) ×1 IMPLANT
SYR CONTROL 10ML LL (SYRINGE) ×2 IMPLANT
TAPE MEASURE VINYL STERILE (MISCELLANEOUS) IMPLANT
TOWEL GREEN STERILE FF (TOWEL DISPOSABLE) ×3 IMPLANT
TRAY DSU PREP LF (CUSTOM PROCEDURE TRAY) ×1 IMPLANT
TUBE CONNECTING 20X1/4 (TUBING) ×1 IMPLANT
TUBING INFILTRATION IT-10001 (TUBING) IMPLANT
TUBING SET GRADUATE ASPIR 12FT (MISCELLANEOUS) IMPLANT
UNDERPAD 30X36 HEAVY ABSORB (UNDERPADS AND DIAPERS) ×2 IMPLANT
YANKAUER SUCT BULB TIP NO VENT (SUCTIONS) ×1 IMPLANT

## 2023-04-24 NOTE — Discharge Instructions (Addendum)
 INSTRUCTIONS FOR AFTER BREAST SURGERY   You will likely have some questions about what to expect following your operation.  The following information will help you and your family understand what to expect when you are discharged from the hospital.  It is important to follow these guidelines to help ensure a smooth recovery and reduce complication.  Postoperative instructions include information on: diet, wound care, medications and physical activity.  AFTER SURGERY Expect to go home after the procedure.  In some cases, you may need to spend one night in the hospital for observation.  DIET Breast surgery does not require a specific diet.  However, the healthier you eat the better your body will heal. It is important to increasing your protein intake.  This means limiting the foods with sugar and carbohydrates.  Focus on vegetables and some meat.  If you have liposuction during your procedure be sure to drink water.  If your urine is bright yellow, then it is concentrated, and you need to drink more water.  As a general rule after surgery, you should have 8 ounces of water every hour while awake.  If you find you are persistently nauseated or unable to take in liquids let us know.  NO TOBACCO USE or EXPOSURE.  This will slow your healing process and lead to a wound.  WOUND CARE Leave the binder on for 3 days . Use fragrance free soap like Dial, Dove or [place].   After 3 days you can remove the binder to shower. Once dry apply binder or sports bra. If you have liposuction you will have a soft and spongy dressing (Lipofoam) that helps prevent creases in your skin.  Remove before you shower and then replace it.  It is also available on [employer]. If you have steri-strips / tape directly attached to your skin leave them in place. It is OK to get these wet.   No baths, pools or hot tubs for four weeks. We close your incision to leave the smallest and best-looking scar. No ointment or creams on your incisions  for four weeks.  No Neosporin (Too many skin reactions).  A few weeks after surgery you can use Mederma and start massaging the scar. We ask you to wear your binder or sports bra for the first 6 weeks around the clock, including while sleeping. This provides added comfort and helps reduce the fluid accumulation at the surgery site. NO Ice or heating pads to the operative site.  You have a very high risk of a BURN before you feel the temperature change.  ACTIVITY No heavy lifting until cleared by the doctor.  This usually means no more than a half-gallon of milk.  It is OK to walk and climb stairs. Moving your legs is very important to decrease your risk of a blood clot.  It will also help keep you from getting deconditioned.  Every 1 to 2 hours get up and walk for 5 minutes. This will help with a quicker recovery back to normal.  Let pain be your guide so you don't do too much.  This time is for you to recover.  You will be more comfortable if you sleep and rest with your head elevated either with a few pillows under you or in a recliner.  No stomach sleeping for a three months.  WORK Everyone returns to work at different times. As a rough guide, most people take at least 1 - 2 weeks off prior to returning to work. If  you need documentation for your job, give the forms to the front staff at the clinic.  DRIVING Arrange for someone to bring you home from the hospital after your surgery.  You may be able to drive a few days after surgery but not while taking any narcotics or valium.  BOWEL MOVEMENTS Constipation can occur after anesthesia and while taking pain medication.  It is important to stay ahead for your comfort.  We recommend taking Milk of Magnesia (2 tablespoons; twice a day) while taking the pain pills.  MEDICATIONS You may be prescribed should start after surgery At your preoperative visit for you history and physical you may have been given the following medications: An antibiotic: Start  this medication when you get home and take according to the instructions on the bottle. Zofran 4 mg:  This is to treat nausea and vomiting.  You can take this every 6 hours as needed and only if needed. Valium 2 mg for breast cancer patients: This is for muscle tightness if you have an implant or expander. This will help relax your muscle which also helps with pain control.  This can be taken every 12 hours as needed. Don't drive after taking this medication. Norco (hydrocodone/acetaminophen) 5/325 mg:  This is only to be used after you have taken the Motrin or the Tylenol. Every 8 hours as needed.   Over the counter Medication to take: Ibuprofen (Motrin) 600 mg:  Take this every 6 hours.  If you have additional pain then take 500 mg of the Tylenol every 8 hours.  Only take the Norco after you have tried these two. MiraLAX or Milk of Magnesia: Take this according to the bottle if you take the Norco.  WHEN TO CALL Call your surgeon's office if any of the following occur: Fever 101 degrees F or greater Excessive bleeding or fluid from the incision site. Pain that increases over time without aid from the medications Redness, warmth, or pus draining from incision sites Persistent nausea or inability to take in liquids Severe misshapen area that underwent the operation.  May take Tylenol after 1:45 pm, if needed.    Post Anesthesia Home Care Instructions  Activity: Get plenty of rest for the remainder of the day. A responsible individual must stay with you for 24 hours following the procedure.  For the next 24 hours, DO NOT: -Drive a car -Advertising copywriter -Drink alcoholic beverages -Take any medication unless instructed by your physician -Make any legal decisions or sign important papers.  Meals: Start with liquid foods such as gelatin or soup. Progress to regular foods as tolerated. Avoid greasy, spicy, heavy foods. If nausea and/or vomiting occur, drink only clear liquids until the  nausea and/or vomiting subsides. Call your physician if vomiting continues.  Special Instructions/Symptoms: Your throat may feel dry or sore from the anesthesia or the breathing tube placed in your throat during surgery. If this causes discomfort, gargle with warm salt water. The discomfort should disappear within 24 hours.  If you had a scopolamine patch placed behind your ear for the management of post- operative nausea and/or vomiting:  1. The medication in the patch is effective for 72 hours, after which it should be removed.  Wrap patch in a tissue and discard in the trash. Wash hands thoroughly with soap and water. 2. You may remove the patch earlier than 72 hours if you experience unpleasant side effects which may include dry mouth, dizziness or visual disturbances. 3. Avoid touching the patch. Wash  your hands with soap and water after contact with the patch.    Information for Discharge Teaching: EXPAREL (bupivacaine liposome injectable suspension)   Pain relief is important to your recovery. The goal is to control your pain so you can move easier and return to your normal activities as soon as possible after your procedure. Your physician may use several types of medicines to manage pain, swelling, and more.  Your surgeon or anesthesiologist gave you EXPAREL(bupivacaine) to help control your pain after surgery.  EXPAREL is a local anesthetic designed to release slowly over an extended period of time to provide pain relief by numbing the tissue around the surgical site. EXPAREL is designed to release pain medication over time and can control pain for up to 72 hours. Depending on how you respond to EXPAREL, you may require less pain medication during your recovery. EXPAREL can help reduce or eliminate the need for opioids during the first few days after surgery when pain relief is needed the most. EXPAREL is not an opioid and is not addictive. It does not cause sleepiness or sedation.    Important! A teal colored band has been placed on your arm with the date, time and amount of EXPAREL you have received. Please leave this armband in place for the full 96 hours following administration, and then you may remove the band. If you return to the hospital for any reason within 96 hours following the administration of EXPAREL, the armband provides important information that your health care providers to know, and alerts them that you have received this anesthetic.    Possible side effects of EXPAREL: Temporary loss of sensation or ability to move in the area where medication was injected. Nausea, vomiting, constipation Rarely, numbness and tingling in your mouth or lips, lightheadedness, or anxiety may occur. Call your doctor right away if you think you may be experiencing any of these sensations, or if you have other questions regarding possible side effects.  Follow all other discharge instructions given to you by your surgeon or nurse. Eat a healthy diet and drink plenty of water or other fluids.

## 2023-04-24 NOTE — Anesthesia Procedure Notes (Signed)
 Procedure Name: LMA Insertion Date/Time: 04/24/2023 8:36 AM  Performed by: Eugenia Hess, CRNAPre-anesthesia Checklist: Patient identified, Emergency Drugs available, Suction available and Patient being monitored Patient Re-evaluated:Patient Re-evaluated prior to induction Oxygen Delivery Method: Circle System Utilized Preoxygenation: Pre-oxygenation with 100% oxygen Induction Type: IV induction Ventilation: Mask ventilation without difficulty LMA: LMA inserted LMA Size: 4.0 Number of attempts: 1 Airway Equipment and Method: bite block Placement Confirmation: positive ETCO2 Tube secured with: Tape Dental Injury: Teeth and Oropharynx as per pre-operative assessment

## 2023-04-24 NOTE — Op Note (Addendum)
 Breast Reduction Op note:    DATE OF PROCEDURE: 04/24/2023  LOCATION: [facility]  SURGEON: Gilles Lacks, DO  ASSISTANT: Lamount Pimple, PA  PREOPERATIVE DIAGNOSIS 1. Breast cancer 2. Breast Asymmetry after partial mastectomy  POSTOPERATIVE DIAGNOSIS Same as preoperative diagnosis  PROCEDURES 1. Left Breast reduction. 300 g  COMPLICATIONS: None.  DRAINS: none  INDICATIONS FOR PROCEDURE Aria Jarrard is a 63 y.o. year-old female born on Sep 25, 1960,with a history of right breast cancer.  She had significant asymmetry.  This was done for better symmetry.   MRN: [id number]  CONSENT Informed consent was obtained directly from the patient. The risks, benefits and alternatives were fully discussed. Specific risks including but not limited to bleeding, infection, hematoma, seroma, scarring, pain, nipple necrosis, asymmetry, poor cosmetic results, and need for further surgery were discussed. The patient's questions were answered.  DESCRIPTION OF PROCEDURE  Patient was brought into the operating room and rested on the operating room table in the supine position.  SCDs were placed and appropriate padding was performed.  Antibiotics were given. The patient underwent general anesthesia and the chest was prepped and draped in a sterile fashion.  A timeout was performed and all information was confirmed to be correct by those in the room. Tumescent was placed in the lateral breast. Liposuction was done laterally.  Left side: Preoperative markings were confirmed.  Incision lines were injected with local containing epinephrine.  After waiting for vasoconstriction, the marked lines were incised with a #15 blade.  A Wise-pattern superomedial breast reduction was performed by de-epithelializing the pedicle, using bovie to create the superomedial pedicle, and removing breast tissue from the lateral and inferior portions of the breast.  Care was taken to not undermine the  breast pedicle. Hemostasis was achieved.  The nipple was gently rotated into position and the soft tissue was closed with 4-0 Monocryl.  The patient was sat upright and size and shape symmetry was confirmed.  Hemostasis confirmed. Myriad and Experel were placed in the pocket. The deep tissues were approximated with 3-0 PDS sutures. The skin was closed with deep dermal 3-0 Monocryl and subcuticular 4-0 Monocryl sutures.  Dermabond was applied.  A breast binder and ABDs were placed.  The nipple and skin flaps had good capillary refill at the end of the procedure.  The patient tolerated the procedure well. The patient was allowed to wake from anesthesia and taken to the recovery room in satisfactory condition.  The advanced practice practitioner (APP) assisted throughout the case.  The APP was essential in retraction and counter traction when needed to make the case progress smoothly.  This retraction and assistance made it possible to see the tissue plans for the procedure.  The assistance was needed for blood control, tissue re-approximation and assisted with closure of the incision site.

## 2023-04-24 NOTE — Interval H&P Note (Signed)
 History and Physical Interval Note:  04/24/2023 7:46 AM  Teresa Franco  has presented today for surgery, with the diagnosis of history of breast cancer.  The various methods of treatment have been discussed with the patient and family. After consideration of risks, benefits and other options for treatment, the patient has consented to  Procedure(s): BREAST REDUCTION WITH LIPOSUCTION (Left) as a surgical intervention.  The patient's history has been reviewed, patient examined, no change in status, stable for surgery.  I have reviewed the patient's chart and labs.  Questions were answered to the patient's satisfaction.     Lindaann Requena Infant Zink

## 2023-04-24 NOTE — Transfer of Care (Signed)
 Immediate Anesthesia Transfer of Care Note  Patient: Teresa Franco  Procedure(s) Performed: BREAST REDUCTION WITH LIPOSUCTION (Left: Breast)  Patient Location: PACU  Anesthesia Type:General  Level of Consciousness: awake, alert , oriented, drowsy, and patient cooperative  Airway & Oxygen Therapy: Patient Spontanous Breathing and Patient connected to face mask oxygen  Post-op Assessment: Report given to RN and Post -op Vital signs reviewed and stable  Post vital signs: Reviewed and stable  Last Vitals:  Vitals Value Taken Time  BP 108/70 04/24/23 0939  Temp    Pulse 100 04/24/23 0940  Resp    SpO2 94 % 04/24/23 0940  Vitals shown include unfiled device data.  Last Pain:  Vitals:   04/24/23 0716  TempSrc: Temporal  PainSc: 0-No pain      Patients Stated Pain Goal: 3 (04/24/23 0716)  Complications: No notable events documented.

## 2023-04-24 NOTE — Anesthesia Postprocedure Evaluation (Signed)
 Anesthesia Post Note  Patient: Teresa Franco  Procedure(s) Performed: BREAST REDUCTION WITH LIPOSUCTION (Left: Breast)     Patient location during evaluation: PACU Anesthesia Type: General Level of consciousness: awake and alert and oriented Pain management: pain level controlled Vital Signs Assessment: post-procedure vital signs reviewed and stable Respiratory status: spontaneous breathing, nonlabored ventilation and respiratory function stable Cardiovascular status: blood pressure returned to baseline and stable Postop Assessment: no apparent nausea or vomiting Anesthetic complications: no   No notable events documented.  Last Vitals:  Vitals:   04/24/23 0945 04/24/23 1000  BP: 116/74 113/75  Pulse: 98 100  Resp: 15 12  Temp:    SpO2: 100% 97%    Last Pain:  Vitals:   04/24/23 1000  TempSrc:   PainSc: 4                  Abdulahad Mederos A.

## 2023-04-25 LAB — SURGICAL PATHOLOGY

## 2023-04-26 ENCOUNTER — Encounter (HOSPITAL_BASED_OUTPATIENT_CLINIC_OR_DEPARTMENT_OTHER): Payer: Self-pay | Admitting: Plastic Surgery

## 2023-05-02 NOTE — Progress Notes (Signed)
 Patient is a 63 year old female with history of breast cancer.  She underwent left breast reduction for symmetry with Dr. Orin Birk on 04/24/2023.  She is a little over 1 week postop.  She presents to the clinic today for postoperative follow-up.  Today, patient reports she is doing well.  She states that she has noticed a little bit of soreness to her lateral breast and she noticed some bruising, but states that the bruising has gone away.  She states otherwise she is not having any pain.  She denies any other issues or concerns at this time.  She denies any fevers or chills.  She denies any drainage from her breast.  Chaperone present on exam.  On exam, patient is sitting upright in no acute distress.  Left breast is soft.  There is no overlying erythema.  No overlying ecchymosis.  There is a little bit of swelling noted.  No obvious fluid collections palpated on exam.  NAC appears to be healthy.  Mepilex border dressing is clean dry and intact.  There are no signs of infection on exam.  Discussed with patient that soreness to the lateral breast is most likely from the liposuction.  Discussed with her that this should improve with time.  Patient expressed understanding.  Discussed with patient to continue with her compression bra and to avoid strenuous activities.  Recommended that patient follow-up at her neck scheduled appointment in about 2 weeks.  Instructed her to call in the meantime though if she has any questions or concerns about anything.  Pictures were obtained of the patient and placed in the chart with the patient's or guardian's permission.

## 2023-05-03 ENCOUNTER — Ambulatory Visit: Payer: BC Managed Care – PPO | Admitting: Student

## 2023-05-03 ENCOUNTER — Encounter: Payer: Self-pay | Admitting: Student

## 2023-05-03 VITALS — BP 120/83 | HR 110 | Ht 68.0 in | Wt 187.0 lb

## 2023-05-03 DIAGNOSIS — Z853 Personal history of malignant neoplasm of breast: Secondary | ICD-10-CM

## 2023-05-08 ENCOUNTER — Ambulatory Visit: Payer: BC Managed Care – PPO | Admitting: Family

## 2023-05-15 ENCOUNTER — Ambulatory Visit: Payer: BC Managed Care – PPO | Admitting: Surgical

## 2023-05-15 VITALS — BP 113/77 | HR 102

## 2023-05-15 DIAGNOSIS — Z853 Personal history of malignant neoplasm of breast: Secondary | ICD-10-CM

## 2023-05-15 DIAGNOSIS — Z9889 Other specified postprocedural states: Secondary | ICD-10-CM

## 2023-05-15 DIAGNOSIS — C50811 Malignant neoplasm of overlapping sites of right female breast: Secondary | ICD-10-CM

## 2023-05-15 NOTE — Progress Notes (Signed)
 Patient is a 63 y.o.-year-old female status post left breast reduction with Dr.  Orin Birk. Patient is 3 weeks postop.  She reports is overall doing well.  She does not have any specific questions or concerns related to recovery, but does report some irritation of the left nipple when wearing her compressive bra.  She is not having any infectious symptoms  Chaperone present on exam Left NAC viable, breast incisions are intact.  Steri-Strips are in place. There is no erythema or cellulitic changes noted. She does have a small subcutaneous fluid collection noted palpation, no tension on the incisions.  No overlying skin changes.  A/P:  Recommend continuing with compressive garment 24/7 until 6 weeks post-op,  avoiding strenuous activity/heavy lifting until 6 weeks post-op  Recommend following up in 2 to 3 weeks for reevaluation.  All of the patient's questions were answered to their content. Recommend calling with any questions or concerns.

## 2023-05-29 ENCOUNTER — Ambulatory Visit (INDEPENDENT_AMBULATORY_CARE_PROVIDER_SITE_OTHER): Payer: BC Managed Care – PPO | Admitting: Surgical

## 2023-05-29 VITALS — BP 111/80 | HR 99

## 2023-05-29 DIAGNOSIS — C50811 Malignant neoplasm of overlapping sites of right female breast: Secondary | ICD-10-CM

## 2023-05-29 DIAGNOSIS — Z170 Estrogen receptor positive status [ER+]: Secondary | ICD-10-CM

## 2023-05-29 DIAGNOSIS — Z719 Counseling, unspecified: Secondary | ICD-10-CM

## 2023-05-29 DIAGNOSIS — Z853 Personal history of malignant neoplasm of breast: Secondary | ICD-10-CM

## 2023-05-29 NOTE — Progress Notes (Signed)
 Patient is a 63 y.o.-year-old female status post left breast reduction with Dr.  Orin Birk. Patient is 5 weeks postop.  Chaperone present on exam Left NAC viable, left breast incisions are intact. There is no erythema or cellulitic changes noted. No obvious subcutaneous fluid collections noted with palpation.  A/P:  Recommend continuing with compressive garment 24/7 until 6 weeks post-op,  avoiding strenuous activity/heavy lifting until 6 weeks post-op.  Discussed with patient she can increase activity at this point, but be sure to wear compressive garment.  Restrictions of lifting no greater than 20 to 25 pounds for 1 more week.  Pictures were obtained of the patient and placed in the chart with the patient's or guardian's permission.   Recommend following up as needed.  We did discuss that if she notices any concerns or changes, we can see her at any point.  We did discuss the importance of scar cream use, discussed recommendations of various products and how to use.  All of the patient's questions were answered to their content. Recommend calling with any questions or concerns.

## 2023-06-06 DIAGNOSIS — R7309 Other abnormal glucose: Secondary | ICD-10-CM | POA: Diagnosis not present

## 2023-06-06 DIAGNOSIS — H43813 Vitreous degeneration, bilateral: Secondary | ICD-10-CM | POA: Diagnosis not present

## 2023-06-06 DIAGNOSIS — H1045 Other chronic allergic conjunctivitis: Secondary | ICD-10-CM | POA: Diagnosis not present

## 2023-06-06 DIAGNOSIS — H04122 Dry eye syndrome of left lacrimal gland: Secondary | ICD-10-CM | POA: Diagnosis not present

## 2023-07-10 ENCOUNTER — Other Ambulatory Visit: Payer: Self-pay

## 2023-07-10 DIAGNOSIS — E119 Type 2 diabetes mellitus without complications: Secondary | ICD-10-CM

## 2023-07-16 ENCOUNTER — Other Ambulatory Visit

## 2023-07-17 ENCOUNTER — Other Ambulatory Visit: Payer: Self-pay

## 2023-07-17 ENCOUNTER — Other Ambulatory Visit (INDEPENDENT_AMBULATORY_CARE_PROVIDER_SITE_OTHER)

## 2023-07-17 DIAGNOSIS — E119 Type 2 diabetes mellitus without complications: Secondary | ICD-10-CM | POA: Diagnosis not present

## 2023-07-17 LAB — MICROALBUMIN / CREATININE URINE RATIO
Creatinine,U: 329.4 mg/dL
Microalb Creat Ratio: 5 mg/g (ref 0.0–30.0)
Microalb, Ur: 1.6 mg/dL (ref 0.0–1.9)

## 2023-07-17 LAB — BASIC METABOLIC PANEL WITH GFR
BUN: 14 mg/dL (ref 6–23)
CO2: 28 meq/L (ref 19–32)
Calcium: 10.1 mg/dL (ref 8.4–10.5)
Chloride: 103 meq/L (ref 96–112)
Creatinine, Ser: 0.98 mg/dL (ref 0.40–1.20)
GFR: 62.18 mL/min (ref >60.00)
Glucose, Bld: 90 mg/dL (ref 70–99)
Potassium: 4 meq/L (ref 3.5–5.1)
Sodium: 139 meq/L (ref 135–145)

## 2023-07-17 LAB — HEMOGLOBIN A1C: Hgb A1c MFr Bld: 6 % (ref 4.6–6.5)

## 2023-07-18 ENCOUNTER — Ambulatory Visit: Payer: Self-pay | Admitting: Family

## 2023-08-02 ENCOUNTER — Encounter: Payer: Self-pay | Admitting: Family

## 2023-08-04 ENCOUNTER — Other Ambulatory Visit: Payer: Self-pay | Admitting: Family

## 2023-08-04 DIAGNOSIS — E119 Type 2 diabetes mellitus without complications: Secondary | ICD-10-CM

## 2023-08-04 MED ORDER — TIRZEPATIDE 7.5 MG/0.5ML ~~LOC~~ SOAJ: 7.5000 mg | SUBCUTANEOUS | 2 refills | Status: DC

## 2023-08-15 ENCOUNTER — Other Ambulatory Visit: Payer: Self-pay | Admitting: Family

## 2023-08-15 DIAGNOSIS — G5133 Clonic hemifacial spasm, bilateral: Secondary | ICD-10-CM | POA: Diagnosis not present

## 2023-08-15 DIAGNOSIS — E119 Type 2 diabetes mellitus without complications: Secondary | ICD-10-CM

## 2023-08-15 DIAGNOSIS — G245 Blepharospasm: Secondary | ICD-10-CM | POA: Diagnosis not present

## 2023-08-15 DIAGNOSIS — G510 Bell's palsy: Secondary | ICD-10-CM | POA: Diagnosis not present

## 2023-08-16 DIAGNOSIS — Z719 Counseling, unspecified: Secondary | ICD-10-CM

## 2023-08-19 ENCOUNTER — Other Ambulatory Visit (HOSPITAL_COMMUNITY): Payer: Self-pay

## 2023-08-19 ENCOUNTER — Telehealth (HOSPITAL_COMMUNITY): Payer: Self-pay | Admitting: Pharmacy Technician

## 2023-08-19 NOTE — Telephone Encounter (Signed)
 PA request has been Received. New Encounter has been or will be created for follow up. For additional info see Pharmacy Prior Auth telephone encounter from 08/19/23.

## 2023-08-19 NOTE — Telephone Encounter (Signed)
 Pharmacy Patient Advocate Encounter   Received notification from RX Request Messages that prior authorization for Mounjaro  7.5mg  is required/requested.   Insurance verification completed.   The patient is insured through [employer] .   Per test claim: The current 28 day co-pay is, $0.  No PA needed at this time. This test claim was processed through [facility]- copay amounts may vary at other pharmacies due to pharmacy/plan contracts, or as the patient moves through the different stages of their insurance plan.   Called the pharmacy to have them process the rx for 1 month supply. Rx will be ready in 1 hour.

## 2023-08-22 ENCOUNTER — Other Ambulatory Visit: Payer: Self-pay | Admitting: Family

## 2023-08-22 DIAGNOSIS — E119 Type 2 diabetes mellitus without complications: Secondary | ICD-10-CM

## 2023-08-22 MED ORDER — MOUNJARO 7.5 MG/0.5ML ~~LOC~~ SOAJ: 7.5000 mg | SUBCUTANEOUS | 2 refills | Status: DC

## 2023-09-10 ENCOUNTER — Other Ambulatory Visit: Payer: Self-pay | Admitting: Internal Medicine

## 2023-09-10 DIAGNOSIS — Z1231 Encounter for screening mammogram for malignant neoplasm of breast: Secondary | ICD-10-CM

## 2023-10-02 DIAGNOSIS — H16212 Exposure keratoconjunctivitis, left eye: Secondary | ICD-10-CM | POA: Diagnosis not present

## 2023-10-02 DIAGNOSIS — H43813 Vitreous degeneration, bilateral: Secondary | ICD-10-CM | POA: Diagnosis not present

## 2023-10-02 DIAGNOSIS — R7309 Other abnormal glucose: Secondary | ICD-10-CM | POA: Diagnosis not present

## 2023-10-02 DIAGNOSIS — H04122 Dry eye syndrome of left lacrimal gland: Secondary | ICD-10-CM | POA: Diagnosis not present

## 2023-10-09 DIAGNOSIS — H43813 Vitreous degeneration, bilateral: Secondary | ICD-10-CM | POA: Diagnosis not present

## 2023-10-09 DIAGNOSIS — H16212 Exposure keratoconjunctivitis, left eye: Secondary | ICD-10-CM | POA: Diagnosis not present

## 2023-10-09 DIAGNOSIS — H04122 Dry eye syndrome of left lacrimal gland: Secondary | ICD-10-CM | POA: Diagnosis not present

## 2023-10-09 DIAGNOSIS — R7309 Other abnormal glucose: Secondary | ICD-10-CM | POA: Diagnosis not present

## 2023-10-16 DIAGNOSIS — H04122 Dry eye syndrome of left lacrimal gland: Secondary | ICD-10-CM | POA: Diagnosis not present

## 2023-10-16 DIAGNOSIS — R7309 Other abnormal glucose: Secondary | ICD-10-CM | POA: Diagnosis not present

## 2023-10-16 DIAGNOSIS — H16212 Exposure keratoconjunctivitis, left eye: Secondary | ICD-10-CM | POA: Diagnosis not present

## 2023-10-16 DIAGNOSIS — H43813 Vitreous degeneration, bilateral: Secondary | ICD-10-CM | POA: Diagnosis not present

## 2023-10-25 ENCOUNTER — Ambulatory Visit: Payer: BC Managed Care – PPO | Admitting: Internal Medicine

## 2023-10-25 ENCOUNTER — Other Ambulatory Visit: Payer: BC Managed Care – PPO

## 2023-10-27 NOTE — Progress Notes (Unsigned)
 PCP: Dineen Rollene MATSU, FNP   No chief complaint on file.   HPI:      [name] is a 63 y.o. G2P1011 who LMP was No LMP recorded. Patient is postmenopausal., presents today for her annual examination.  Her menses are absent and she is postmenopausal. She does not have PMB. She does not have vasomotor sx.   Sex activity: single partner, contraception - post menopausal status. She does not have vaginal dryness/pain/bleeding.  Last Pap: 10/16/22 Results were LGSIL/neg HPV DNA; LEEP vs repeat pap recommended. 11/24/20 LGSIL with CIN 1 on 12/22 colpo/bx with Dr. Lake. Repeat pap smear in 1 yr.  10/13/19 Results were normal/neg HPV DNA.  03/24/18  ASCUS with neg HPV DNA, same result 2018.  November 09, 2015  Results were: low-grade squamous intraepithelial neoplasia (LGSIL - encompassing HPV,mild dysplasia,CIN I) /neg HPV DNA. Colpo 12/05/15 with Dr. Lake. 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  (Per Dr. Lake, pts with recurrent LGSIL and neg colpo/bx may be better suited for LEEP.) S/p LEEP 4/95. Hx of STDs: HPV  Last mammogram: 10/25/22 was normal, has appt today. 02/07/23 RT breast mammo and u/s due to RT breast mass, cat 2--benign dystrophic calcification in lumpectomy bed.  10/23/21 with oncology; Results were: normal--routine follow-up in 12 months.  Pt is s/p RT breast cancer, her2/neu+ in 2004 (age 54). Had lumpectomy, chemo, rad, 5 yrs of tamoxifen, and 5 yrs of letrozole. Not on any tx currently. Followed by onc yearly. Never did reconstruction at time of treatment but is interested now due to size discrepancy of breasts. Unsure if insurance would still cover it.   There is a FH of breast cancer in 2 mat grt aunts. Pt did BRCA genetic testing through [facility] a few yrs ago. Panel testing done. There is no FH of ovarian cancer. The patient does do self-breast exams. She takes Vit D supp.  Colonoscopy: colonoscopy 11/21 with Dr. Therisa without abnormalities. Repeat due after 10  years per pt.   DEXA: 11/21 and 12/23 at [facility]:  normal spine and hip  Tobacco use: The patient denies current or previous tobacco use. Alcohol use: none  No drug use Exercise: mod active  She does get adequate calcium  and Vitamin D  in her diet.  Labs with PCP.  Past Medical History:  Diagnosis Date   Breast cancer (HCC) 2004   chemo/ radiation; Her2/neu +   Diabetes mellitus without complication (HCC)    Dysrhythmia    palpitations   Hyperlipidemia    LGSIL on Pap smear of cervix    Personal history of chemotherapy    Personal history of radiation therapy    Prediabetes    Seasonal allergies    UTI (urinary tract infection)     Past Surgical History:  Procedure Laterality Date   BREAST BIOPSY Right 07/2002   positive   BREAST BIOPSY Right 2010   negative   BREAST LUMPECTOMY Right 08/09/2002   positive for breast cancer   BREAST REDUCTION SURGERY Left 04/24/2023   Procedure: BREAST REDUCTION WITH LIPOSUCTION;  Surgeon: Lowery Estefana RAMAN, DO;  Location: [facility];  Service: Plastics;  Laterality: Left;   BROW LIFT Bilateral 06/25/2017   Procedure: BLEPHAROPLASTY UPPER EYELID WITH EXCESS SKIN;  Surgeon: Ashley Greig HERO, MD;  Location: [facility];  Service: Ophthalmology;  Laterality: Bilateral;   COLONOSCOPY  03/31/2010   COLONOSCOPY WITH PROPOFOL  N/A 11/12/2019   Procedure: COLONOSCOPY WITH PROPOFOL ;  Surgeon: Therisa Bi,  MD;  Location: [facility];  Service: Gastroenterology;  Laterality: N/A;   LEEP  04/10/1993   OTHER SURGICAL HISTORY  06/2017   eyelid, took off excessive skin    Family History  Problem Relation Age of Onset   Hyperlipidemia Mother    Heart disease Mother    Thyroid  disease Mother        no thyroid  cancer   Hyperlipidemia Father    Heart disease Father 88       MI- died at 33 ? SCD   Hypertension Father    Breast cancer Maternal Aunt 50   Diabetes Maternal Aunt    Breast cancer Maternal Aunt 60   Diabetes  Maternal Aunt    Colon cancer Neg Hx    Ovarian cancer Neg Hx    Melanoma Neg Hx    Thyroid  cancer Neg Hx     Social History   Socioeconomic History   Marital status: Married    Spouse name: Not on file   Number of children: Not on file   Years of education: Not on file   Highest education level: Bachelor's degree (e.g., BA, AB, BS)  Occupational History   Not on file  Tobacco Use   Smoking status: Former    Current packs/day: 0.00    Types: Cigarettes    Quit date: 01/08/1997    Years since quitting: 26.8   Smokeless tobacco: Never  Vaping Use   Vaping status: Never Used  Substance and Sexual Activity   Alcohol use: Not Currently    Comment: sometimes on holidays   Drug use: No   Sexual activity: Yes    Birth control/protection: Post-menopausal  Other Topics Concern   Not on file  Social History Narrative   From [place]   Works for telecommunication    1 daughter, Freshmen at [employer] in [employer]            Social Drivers of Health   Financial Resource Strain: Low Risk  (12/23/2022)   Overall Financial Resource Strain (CARDIA)    Difficulty of Paying Living Expenses: Not hard at all  Food Insecurity: No Food Insecurity (12/23/2022)   Hunger Vital Sign    Worried About Running Out of Food in the Last Year: Never true    Ran Out of Food in the Last Year: Never true  Transportation Needs: No Transportation Needs (12/23/2022)   PRAPARE - Administrator, Civil Service (Medical): No    Lack of Transportation (Non-Medical): No  Physical Activity: Sufficiently Active (12/23/2022)   Exercise Vital Sign    Days of Exercise per Week: 6 days    Minutes of Exercise per Session: 60 min  Stress: No Stress Concern Present (12/23/2022)   [employer] of Occupational Health - Occupational Stress Questionnaire    Feeling of Stress : Only a little  Social Connections: Moderately Isolated (12/23/2022)   Social Connection and Isolation Panel    Frequency of  Communication with Friends and Family: More than three times a week    Frequency of Social Gatherings with Friends and Family: Twice a week    Attends Religious Services: More than 4 times per year    Active Member of [employer] or Organizations: No    Attends Banker Meetings: Not on file    Marital Status: Separated  Intimate Partner Violence: Not on file    No outpatient medications have been marked as taking for the 10/28/23 encounter Administrator, Civil Service) with Chauncey Sciulli B, PA-C.  ROS:  Review of Systems  Constitutional:  Negative for fatigue, fever and unexpected weight change.  Respiratory:  Negative for cough, shortness of breath and wheezing.   Cardiovascular:  Negative for chest pain, palpitations and leg swelling.  Gastrointestinal:  Negative for blood in stool, constipation, diarrhea, nausea and vomiting.  Endocrine: Negative for cold intolerance, heat intolerance and polyuria.  Genitourinary:  Negative for dyspareunia, dysuria, flank pain, frequency, genital sores, hematuria, menstrual problem, pelvic pain, urgency, vaginal bleeding, vaginal discharge and vaginal pain.  Musculoskeletal:  Negative for back pain, joint swelling and myalgias.  Skin:  Negative for rash.  Neurological:  Negative for dizziness, syncope, light-headedness, numbness and headaches.  Hematological:  Negative for adenopathy.  Psychiatric/Behavioral:  Negative for agitation, confusion, sleep disturbance and suicidal ideas. The patient is not nervous/anxious.      Objective: There were no vitals taken for this visit.   Physical Exam Constitutional:      Appearance: She is well-developed.  Genitourinary:     Vulva normal.     Right Labia: No rash, tenderness or lesions.    Left Labia: No tenderness, lesions or rash.    No vaginal discharge, erythema or tenderness.      Right Adnexa: not tender and no mass present.    Left Adnexa: not tender and no mass present.    No cervical  motion tenderness, friability or polyp.     Uterus is not enlarged or tender.  Breasts:    Right: No mass, nipple discharge, skin change or tenderness.     Left: No mass, nipple discharge, skin change or tenderness.  Neck:     Thyroid : No thyromegaly.  Cardiovascular:     Rate and Rhythm: Normal rate and regular rhythm.     Heart sounds: Normal heart sounds. No murmur heard. Pulmonary:     Effort: Pulmonary effort is normal.     Breath sounds: Normal breath sounds.  Abdominal:     Palpations: Abdomen is soft.     Tenderness: There is no abdominal tenderness. There is no guarding or rebound.  Musculoskeletal:        General: Normal range of motion.     Cervical back: Normal range of motion.  Lymphadenopathy:     Cervical: No cervical adenopathy.  Neurological:     General: No focal deficit present.     Mental Status: She is alert and oriented to person, place, and time.     Cranial Nerves: No cranial nerve deficit.  Skin:    General: Skin is warm and dry.  Psychiatric:        Mood and Affect: Mood normal.        Behavior: Behavior normal.        Thought Content: Thought content normal.        Judgment: Judgment normal.  Vitals reviewed.     Assessment/Plan: Encounter for annual routine gynecological examination  Cervical cancer screening - Plan: Cytology - PAP  Screening for HPV (human papillomavirus) - Plan: Cytology - PAP  Dysplasia of cervix, low grade (CIN 1) - Plan: Cytology - PAP  Encounter for screening mammogram for malignant neoplasm of breast; pt has mammo appt 10/24 with oncology  History of right breast cancer - Plan: Ambulatory referral to Plastic Surgery; pt interested in reconstruction now, Refer to Dr. Lowery.         GYN counsel mammography screening, adequate intake of calcium  and vitamin D , diet and exercise    F/U  No follow-ups on  file.  Trishelle Devora B. Tyrees Chopin, PA-C 10/27/2023 7:34 PM

## 2023-10-28 ENCOUNTER — Ambulatory Visit: Admitting: Obstetrics and Gynecology

## 2023-10-28 ENCOUNTER — Ambulatory Visit
Admission: RE | Admit: 2023-10-28 | Discharge: 2023-10-28 | Disposition: A | Discharge status: Home / Self Care | Source: Ambulatory Visit | Attending: Internal Medicine | Admitting: Internal Medicine

## 2023-10-28 ENCOUNTER — Other Ambulatory Visit (HOSPITAL_COMMUNITY)
Admission: RE | Admit: 2023-10-28 | Discharge: 2023-10-28 | Disposition: A | Discharge status: Home / Self Care | Source: Ambulatory Visit | Attending: Obstetrics and Gynecology | Admitting: Obstetrics and Gynecology

## 2023-10-28 ENCOUNTER — Encounter: Payer: Self-pay | Admitting: Obstetrics and Gynecology

## 2023-10-28 VITALS — BP 107/71 | HR 101 | Ht 68.0 in | Wt 187.0 lb

## 2023-10-28 DIAGNOSIS — N3946 Mixed incontinence: Secondary | ICD-10-CM

## 2023-10-28 DIAGNOSIS — Z9889 Other specified postprocedural states: Secondary | ICD-10-CM

## 2023-10-28 DIAGNOSIS — Z1231 Encounter for screening mammogram for malignant neoplasm of breast: Secondary | ICD-10-CM | POA: Insufficient documentation

## 2023-10-28 DIAGNOSIS — Z124 Encounter for screening for malignant neoplasm of cervix: Secondary | ICD-10-CM | POA: Insufficient documentation

## 2023-10-28 DIAGNOSIS — Z01419 Encounter for gynecological examination (general) (routine) without abnormal findings: Secondary | ICD-10-CM | POA: Diagnosis not present

## 2023-10-28 DIAGNOSIS — Z1151 Encounter for screening for human papillomavirus (HPV): Secondary | ICD-10-CM | POA: Diagnosis not present

## 2023-10-28 DIAGNOSIS — N870 Mild cervical dysplasia: Secondary | ICD-10-CM

## 2023-10-28 DIAGNOSIS — Z853 Personal history of malignant neoplasm of breast: Secondary | ICD-10-CM

## 2023-10-28 NOTE — Patient Instructions (Signed)
 I value your feedback and you entrusting [place] with your care. If you get a [place] patient survey, I would appreciate you taking the time to let us know about your experience today. Thank you! ? ? ?

## 2023-10-31 ENCOUNTER — Ambulatory Visit: Payer: Self-pay | Admitting: Obstetrics and Gynecology

## 2023-10-31 LAB — CYTOLOGY - PAP
Comment: NEGATIVE
Diagnosis: UNDETERMINED — AB
High risk HPV: NEGATIVE

## 2023-11-01 DIAGNOSIS — H04122 Dry eye syndrome of left lacrimal gland: Secondary | ICD-10-CM | POA: Diagnosis not present

## 2023-11-01 DIAGNOSIS — H43813 Vitreous degeneration, bilateral: Secondary | ICD-10-CM | POA: Diagnosis not present

## 2023-11-01 DIAGNOSIS — H16212 Exposure keratoconjunctivitis, left eye: Secondary | ICD-10-CM | POA: Diagnosis not present

## 2023-11-01 DIAGNOSIS — R7309 Other abnormal glucose: Secondary | ICD-10-CM | POA: Diagnosis not present

## 2023-11-11 ENCOUNTER — Other Ambulatory Visit: Payer: Self-pay | Admitting: *Deleted

## 2023-11-11 DIAGNOSIS — C50811 Malignant neoplasm of overlapping sites of right female breast: Secondary | ICD-10-CM

## 2023-11-12 ENCOUNTER — Inpatient Hospital Stay: Payer: BC Managed Care – PPO | Attending: Internal Medicine

## 2023-11-12 ENCOUNTER — Encounter: Payer: Self-pay | Admitting: Internal Medicine

## 2023-11-12 ENCOUNTER — Inpatient Hospital Stay: Payer: BC Managed Care – PPO | Admitting: Internal Medicine

## 2023-11-12 VITALS — BP 120/82 | HR 72 | Temp 96.4°F | Resp 16 | Ht 68.0 in | Wt 184.8 lb

## 2023-11-12 DIAGNOSIS — Z170 Estrogen receptor positive status [ER+]: Secondary | ICD-10-CM | POA: Diagnosis not present

## 2023-11-12 DIAGNOSIS — E785 Hyperlipidemia, unspecified: Secondary | ICD-10-CM | POA: Diagnosis not present

## 2023-11-12 DIAGNOSIS — E119 Type 2 diabetes mellitus without complications: Secondary | ICD-10-CM | POA: Insufficient documentation

## 2023-11-12 DIAGNOSIS — Z79899 Other long term (current) drug therapy: Secondary | ICD-10-CM | POA: Insufficient documentation

## 2023-11-12 DIAGNOSIS — C50811 Malignant neoplasm of overlapping sites of right female breast: Secondary | ICD-10-CM

## 2023-11-12 DIAGNOSIS — Z853 Personal history of malignant neoplasm of breast: Secondary | ICD-10-CM | POA: Insufficient documentation

## 2023-11-12 DIAGNOSIS — Z87891 Personal history of nicotine dependence: Secondary | ICD-10-CM | POA: Insufficient documentation

## 2023-11-12 DIAGNOSIS — Z803 Family history of malignant neoplasm of breast: Secondary | ICD-10-CM | POA: Diagnosis not present

## 2023-11-12 DIAGNOSIS — Z7985 Long-term (current) use of injectable non-insulin antidiabetic drugs: Secondary | ICD-10-CM | POA: Diagnosis not present

## 2023-11-12 LAB — CBC WITH DIFFERENTIAL (CANCER CENTER ONLY)
Abs Immature Granulocytes: 0.02 K/uL (ref 0.00–0.07)
Basophils Absolute: 0.1 K/uL (ref 0.0–0.1)
Basophils Relative: 1 %
Eosinophils Absolute: 0.2 K/uL (ref 0.0–0.5)
Eosinophils Relative: 2 %
HCT: 44 % (ref 36.0–46.0)
Hemoglobin: 14.5 g/dL (ref 12.0–15.0)
Immature Granulocytes: 0 %
Lymphocytes Relative: 20 %
Lymphs Abs: 1.8 K/uL (ref 0.7–4.0)
MCH: 29.2 pg (ref 26.0–34.0)
MCHC: 33 g/dL (ref 30.0–36.0)
MCV: 88.7 fL (ref 80.0–100.0)
Monocytes Absolute: 0.5 K/uL (ref 0.1–1.0)
Monocytes Relative: 6 %
Neutro Abs: 6.5 K/uL (ref 1.7–7.7)
Neutrophils Relative %: 71 %
Platelet Count: 252 K/uL (ref 150–400)
RBC: 4.96 MIL/uL (ref 3.87–5.11)
RDW: 13.9 % (ref 11.5–15.5)
WBC Count: 9.1 K/uL (ref 4.0–10.5)
nRBC: 0 % (ref 0.0–0.2)

## 2023-11-12 LAB — CMP (CANCER CENTER ONLY)
ALT: 31 U/L (ref 0–44)
AST: 25 U/L (ref 15–41)
Albumin: 4.3 g/dL (ref 3.5–5.0)
Alkaline Phosphatase: 61 U/L (ref 38–126)
Anion gap: 9 (ref 5–15)
BUN: 14 mg/dL (ref 8–23)
CO2: 26 mmol/L (ref 22–32)
Calcium: 10 mg/dL (ref 8.9–10.3)
Chloride: 102 mmol/L (ref 98–111)
Creatinine: 0.98 mg/dL (ref 0.44–1.00)
GFR, Estimated: >60 mL/min (ref >60)
Glucose, Bld: 90 mg/dL (ref 70–99)
Potassium: 4.4 mmol/L (ref 3.5–5.1)
Sodium: 137 mmol/L (ref 135–145)
Total Bilirubin: 0.9 mg/dL (ref 0.0–1.2)
Total Protein: 8.2 g/dL — ABNORMAL HIGH (ref 6.5–8.1)

## 2023-11-12 NOTE — Progress Notes (Signed)
 [facility] OFFICE PROGRESS NOTE  Patient Care Team: Dineen Rollene MATSU, FNP as PCP - General (Family Medicine) Rennie Cindy SAUNDERS, MD as Consulting Physician (Oncology)   Cancer Staging  No matching staging information was found for the patient.    Oncology History Overview Note  Her2/neu + right breast cancer in 07/2012.  No pathology is available. Lymph nodes were positive.  Tumor was ER/PR positive and HER-2/neu positive.    She received neoadjuvant AC followed by Taxol and Herceptin.  Lumpectomy revealed a complete pathologic response. She received radiation in 03/2003.    She received tamoxifen from 02/2003 - 02/2008. She received her Herceptin from 05/2003 - 05/2004.  She received extended adjuvant hormonal therapy with Lupron and Femara from 03/2008 - 04/2013.  She tolerated her treatment well.   Bilateral screening mammogram on 10/06/2018 revealed no evidence of malignancy.  Bilateral screening mammogram on 10/13/2019 revealed no evidence of malignancy.   CA27.29 has been followed: 15.3 on 08/14/2011, 16.2 on 02/14/2012, 16.0 on 04/14/2013, 9.2 on 11/23/2013, 14.3 on 09/22/2014, 13.3 on 10/04/2016, 9.4 on 10/03/2017, 11.5 on 10/09/2018, 8.2 on 10/30/2018, and 6.9 on 10/15/2019.   Bone density on 09/20/2011 was normal with a T-score of -0.7 of the right femoral neck.    r. 5.   Bell's palsy, resolving               Carcinoma of overlapping sites of right breast in female, estrogen receptor positive (HCC)  10/17/2020 Initial Diagnosis   Carcinoma of overlapping sites of right breast in female, estrogen receptor positive (HCC)    INTERVAL HISTORY: Alone.  Ambulating independently.  Teresa Franco 63 y.o.  female pleasant patient above history of triple positive breast cancer status post neoadjuvant therapy-complete pathologic response is here for follow-up.  Patient had a mammogram last week, breast reduction 4/25. Otherwise doing well. Patient denies  any new lumps or bumps.  Appetite is good no weight loss.  No bone pain pain nausea vomiting.  No chest pain or shortness of breath or cough.  No headaches.  Review of Systems  Constitutional:  Negative for chills, diaphoresis, fever, malaise/fatigue and weight loss.  HENT:  Negative for nosebleeds and sore throat.   Eyes:  Negative for double vision.  Respiratory:  Negative for cough, hemoptysis, sputum production, shortness of breath and wheezing.   Cardiovascular:  Negative for chest pain, palpitations, orthopnea and leg swelling.  Gastrointestinal:  Negative for abdominal pain, blood in stool, constipation, diarrhea, heartburn, melena, nausea and vomiting.  Genitourinary:  Negative for dysuria, frequency and urgency.  Musculoskeletal:  Negative for back pain and joint pain.  Skin: Negative.  Negative for itching and rash.  Neurological:  Negative for dizziness, tingling, focal weakness, weakness and headaches.  Endo/Heme/Allergies:  Does not bruise/bleed easily.  Psychiatric/Behavioral:  Negative for depression. The patient is not nervous/anxious and does not have insomnia.       PAST MEDICAL HISTORY :  Past Medical History:  Diagnosis Date   Breast cancer (HCC) 2004   chemo/ radiation; Her2/neu +   Diabetes mellitus without complication (HCC)    Dysrhythmia    palpitations   Hyperlipidemia    LGSIL on Pap smear of cervix    Personal history of chemotherapy    Personal history of radiation therapy    Prediabetes    Seasonal allergies    UTI (urinary tract infection)     PAST SURGICAL HISTORY :   Past Surgical History:  Procedure Laterality Date  BREAST BIOPSY Right 07/2002   positive   BREAST BIOPSY Right 2010   negative   BREAST LUMPECTOMY Right 08/09/2002   positive for breast cancer   BREAST REDUCTION SURGERY Left 04/24/2023   Procedure: BREAST REDUCTION WITH LIPOSUCTION;  Surgeon: Lowery Estefana RAMAN, DO;  Location: [facility];  Service: Plastics;   Laterality: Left;   BROW LIFT Bilateral 06/25/2017   Procedure: BLEPHAROPLASTY UPPER EYELID WITH EXCESS SKIN;  Surgeon: Ashley Greig HERO, MD;  Location: [facility];  Service: Ophthalmology;  Laterality: Bilateral;   COLONOSCOPY  03/31/2010   COLONOSCOPY WITH PROPOFOL  N/A 11/12/2019   Procedure: COLONOSCOPY WITH PROPOFOL ;  Surgeon: Therisa Bi, MD;  Location: [facility];  Service: Gastroenterology;  Laterality: N/A;   LEEP  04/10/1993   OTHER SURGICAL HISTORY  06/2017   eyelid, took off excessive skin   REDUCTION MAMMAPLASTY      FAMILY HISTORY :   Family History  Problem Relation Age of Onset   Hyperlipidemia Mother    Heart disease Mother    Thyroid  disease Mother        no thyroid  cancer   Hyperlipidemia Father    Heart disease Father 4       MI- died at 55 ? SCD   Hypertension Father    Breast cancer Maternal Aunt 50   Diabetes Maternal Aunt    Breast cancer Maternal Aunt 60   Diabetes Maternal Aunt    Colon cancer Neg Hx    Ovarian cancer Neg Hx    Melanoma Neg Hx    Thyroid  cancer Neg Hx     SOCIAL HISTORY:   Social History   Tobacco Use   Smoking status: Former    Current packs/day: 0.00    Types: Cigarettes    Quit date: 01/08/1997    Years since quitting: 26.8   Smokeless tobacco: Never  Vaping Use   Vaping status: Never Used  Substance Use Topics   Alcohol use: Not Currently    Comment: sometimes on holidays   Drug use: No    ALLERGIES:  is allergic to sulfa antibiotics.  MEDICATIONS:  Current Outpatient Medications  Medication Sig Dispense Refill   Ascorbic Acid (VITAMIN C) 1000 MG tablet Take 1,000 mg by mouth daily.     BIOTIN PO Take by mouth daily.      Cholecalciferol (VITAMIN D3 PO) Take 1 capsule by mouth daily. Take 1000 Iu by  mouth daily.     ezetimibe  (ZETIA ) 10 MG tablet TAKE 1 TABLET BY MOUTH EVERY DAY 30 tablet 11   MAGNESIUM CITRATE PO Take by mouth daily.     MEGARED OMEGA-3 KRILL OIL PO Take by mouth daily.       rosuvastatin  (CRESTOR ) 10 MG tablet TAKE 1 TABLET BY MOUTH EVERY DAY 90 tablet 3   tirzepatide  (MOUNJARO ) 7.5 MG/0.5ML Pen Inject 7.5 mg into the skin once a week. 6 mL 2   VIT B12-METHIONINE-INOS-CHOL IM daily.      No current facility-administered medications for this visit.    PHYSICAL EXAMINATION: ECOG PERFORMANCE STATUS: 0 - Asymptomatic  BP 120/82 (BP Location: Left Arm, Patient Position: Sitting, Cuff Size: Normal)   Pulse 72   Temp (!) 96.4 F (35.8 C) (Tympanic)   Resp 16   Ht 5' 8 (1.727 m)   Wt 184 lb 12.8 oz (83.8 kg)   SpO2 98%   BMI 28.10 kg/m   Filed Weights   11/12/23 1022  Weight: 184 lb 12.8 oz (83.8  kg)     Physical Exam Vitals and nursing note reviewed.  HENT:     Head: Normocephalic and atraumatic.     Mouth/Throat:     Pharynx: Oropharynx is clear.  Eyes:     Extraocular Movements: Extraocular movements intact.     Pupils: Pupils are equal, round, and reactive to light.  Cardiovascular:     Rate and Rhythm: Normal rate and regular rhythm.  Pulmonary:     Comments: Decreased breath sounds bilaterally.  Abdominal:     Palpations: Abdomen is soft.  Musculoskeletal:        General: Normal range of motion.     Cervical back: Normal range of motion.  Skin:    General: Skin is warm.  Neurological:     General: No focal deficit present.     Mental Status: She is alert and oriented to person, place, and time.  Psychiatric:        Behavior: Behavior normal.        Judgment: Judgment normal.        LABORATORY DATA:  I have reviewed the data as listed    Component Value Date/Time   NA 137 11/12/2023 1019   NA 141 11/23/2013 1054   K 4.4 11/12/2023 1019   K 3.6 11/23/2013 1054   CL 102 11/12/2023 1019   CL 106 11/23/2013 1054   CO2 26 11/12/2023 1019   CO2 24 11/23/2013 1054   GLUCOSE 90 11/12/2023 1019   GLUCOSE 92 11/23/2013 1054   BUN 14 11/12/2023 1019   BUN 10 11/23/2013 1054   CREATININE 0.98 11/12/2023 1019   CREATININE 0.91  11/23/2013 1054   CALCIUM  10.0 11/12/2023 1019   CALCIUM  9.6 11/23/2013 1054   PROT 8.2 (H) 11/12/2023 1019   PROT 8.0 04/17/2019 1318   PROT 7.9 11/23/2013 1054   ALBUMIN 4.3 11/12/2023 1019   ALBUMIN 5.0 (H) 04/17/2019 1318   ALBUMIN 3.8 11/23/2013 1054   AST 25 11/12/2023 1019   ALT 31 11/12/2023 1019   ALT 38 11/23/2013 1054   ALKPHOS 61 11/12/2023 1019   ALKPHOS 104 11/23/2013 1054   BILITOT 0.9 11/12/2023 1019   GFRNONAA >60 11/12/2023 1019   GFRNONAA >60 11/23/2013 1054   GFRNONAA 57 (L) 04/14/2013 0925   GFRAA >60 10/30/2018 1027   GFRAA >60 11/23/2013 1054   GFRAA >60 04/14/2013 0925    No results found for: SPEP, UPEP  Lab Results  Component Value Date   WBC 9.1 11/12/2023   NEUTROABS 6.5 11/12/2023   HGB 14.5 11/12/2023   HCT 44.0 11/12/2023   MCV 88.7 11/12/2023   PLT 252 11/12/2023      Chemistry      Component Value Date/Time   NA 137 11/12/2023 1019   NA 141 11/23/2013 1054   K 4.4 11/12/2023 1019   K 3.6 11/23/2013 1054   CL 102 11/12/2023 1019   CL 106 11/23/2013 1054   CO2 26 11/12/2023 1019   CO2 24 11/23/2013 1054   BUN 14 11/12/2023 1019   BUN 10 11/23/2013 1054   CREATININE 0.98 11/12/2023 1019   CREATININE 0.91 11/23/2013 1054   GLU 89 01/17/2021 1552      Component Value Date/Time   CALCIUM  10.0 11/12/2023 1019   CALCIUM  9.6 11/23/2013 1054   ALKPHOS 61 11/12/2023 1019   ALKPHOS 104 11/23/2013 1054   AST 25 11/12/2023 1019   ALT 31 11/12/2023 1019   ALT 38 11/23/2013 1054   BILITOT 0.9 11/12/2023  1019       RADIOGRAPHIC STUDIES: I have personally reviewed the radiological images as listed and agreed with the findings in the report. No results found.   ASSESSMENT & PLAN:  Carcinoma of overlapping sites of right breast in female, estrogen receptor positive (HCC) # TRIPLE POSITIVE BREAST CANCER:  S/p 10 years- OCT 2025-WNL.  Continue monitoring at this time.  No evidence of recurrence.Stable.    # DM-  [July  2023]-pre-diabetic level- on metformin .   # bone density: NOV 2021-= T-score of -0.7.  Recommend ca+vit D-; BMD in dec 2023-  T-score: of -0.8-  stable- will repeat BMD in 1 year   # Genetics testing: BRCA 1; &2-NEGATIVE.    # DISPOSITION: # follow up in 12 months;MD;  labs- cbc/cmp/ca-27-29- BIL  SCREENING mammo/BMD priro- prior-Dr.B    Orders Placed This Encounter  Procedures   DG Bone Density    Standing Status:   Future    Expiration Date:   11/11/2024    Reason for Exam (SYMPTOM  OR DIAGNOSIS REQUIRED):   Height Loss, History of Breast Cancer, Postmenopausal, Pre-Diabetic, Previous Chemo and Radiation Fractures    Preferred imaging location?:   [facility]   MM 3D SCREENING MAMMOGRAM BILATERAL BREAST    Standing Status:   Future    Expected Date:   10/27/2024    Expiration Date:   01/25/2025    Reason for Exam (SYMPTOM  OR DIAGNOSIS REQUIRED):   History of Breast Cancer    Preferred imaging location?:   [facility]   CBC with Differential (Cancer Center Only)    Standing Status:   Future    Expected Date:   11/11/2024    Expiration Date:   02/09/2025   CMP (Cancer Center only)    Standing Status:   Future    Expected Date:   11/11/2024    Expiration Date:   02/09/2025   Cancer antigen 27.29    Standing Status:   Future    Expected Date:   11/11/2024    Expiration Date:   02/09/2025   All questions were answered. The patient knows to call the clinic with any problems, questions or concerns.      Cindy JONELLE Joe, MD 11/12/2023 4:47 PM

## 2023-11-12 NOTE — Assessment & Plan Note (Addendum)
#   TRIPLE POSITIVE BREAST CANCER:  S/p 10 years- OCT 2025-WNL.  Continue monitoring at this time.  No evidence of recurrence.Stable.    # DM-  [July 2023]-pre-diabetic level- on metformin .   # bone density: NOV 2021-= T-score of -0.7.  Recommend ca+vit D-; BMD in dec 2023-  T-score: of -0.8-  stable- will repeat BMD in 1 year   # Genetics testing: BRCA 1; &2-NEGATIVE.    # DISPOSITION: # follow up in 12 months;MD;  labs- cbc/cmp/ca-27-29- BIL  SCREENING mammo/BMD priro- prior-Dr.B

## 2023-11-12 NOTE — Progress Notes (Signed)
 Mammogram last week, breast reduction 4/25.

## 2023-11-13 ENCOUNTER — Ambulatory Visit: Admitting: Family

## 2023-11-13 ENCOUNTER — Encounter: Payer: Self-pay | Admitting: Family

## 2023-11-13 VITALS — BP 110/62 | HR 100 | Temp 98.6°F | Ht 68.0 in | Wt 184.4 lb

## 2023-11-13 DIAGNOSIS — E119 Type 2 diabetes mellitus without complications: Secondary | ICD-10-CM

## 2023-11-13 DIAGNOSIS — E782 Mixed hyperlipidemia: Secondary | ICD-10-CM

## 2023-11-13 DIAGNOSIS — Z7985 Long-term (current) use of injectable non-insulin antidiabetic drugs: Secondary | ICD-10-CM | POA: Diagnosis not present

## 2023-11-13 DIAGNOSIS — R7309 Other abnormal glucose: Secondary | ICD-10-CM

## 2023-11-13 LAB — POCT GLYCOSYLATED HEMOGLOBIN (HGB A1C): HbA1c POC (<> result, manual entry): 5.6 % (ref 4.0–5.6)

## 2023-11-13 LAB — CANCER ANTIGEN 27.29: CA 27.29: 16.8 U/mL (ref 0.0–38.6)

## 2023-11-13 MED ORDER — EZETIMIBE 10 MG PO TABS: 10.0000 mg | ORAL_TABLET | Freq: Every day | ORAL | 3 refills | Status: AC

## 2023-11-13 NOTE — Assessment & Plan Note (Signed)
 Lab Results  Component Value Date   LDLCALC 64 12/28/2022   Chronic, stable.  Continue Crestor  10 mg daily, Zetia  10 mg daily

## 2023-11-13 NOTE — Progress Notes (Signed)
 Assessment & Plan:  Elevated glucose -     POCT glycosylated hemoglobin (Hb A1C)  Diabetes mellitus without complication (HCC) Assessment & Plan: Excellent glycemic control on Mounjaro  7.5 mg.  Continue medication for glycemic control, weight maintenance.   Mixed hyperlipidemia Assessment & Plan: Lab Results  Component Value Date   LDLCALC 64 12/28/2022   Chronic, stable.  Continue Crestor  10 mg daily, Zetia  10 mg daily   Other orders -     Ezetimibe ; Take 1 tablet (10 mg total) by mouth daily.  Dispense: 90 tablet; Refill: 3     Return precautions given.   Risks, benefits, and alternatives of the medications and treatment plan prescribed today were discussed, and patient expressed understanding.   Education regarding symptom management and diagnosis given to patient on AVS either electronically or printed.  Return in about 3 months (around 02/13/2024).  Teresa Northern, FNP  Subjective:    Patient ID: Teresa Franco, female    DOB: [birth year], 63 y.o.   MRN: [id number]  CC: Teresa Franco is a 63 y.o. female who presents today for follow up.   HPI: She feels well today.  No new complaints.  She is tolerating Mounjaro  7.5 well.  She is pleased with weight maintenance.  She remains compliant with Zetia , Crestor     Allergies: Sulfa antibiotics Current Outpatient Medications on File Prior to Visit  Medication Sig Dispense Refill   Ascorbic Acid (VITAMIN C) 1000 MG tablet Take 1,000 mg by mouth daily.     BIOTIN PO Take by mouth daily.      Cholecalciferol (VITAMIN D3 PO) Take 1 capsule by mouth daily. Take 1000 Iu by  mouth daily.     MAGNESIUM CITRATE PO Take by mouth daily.     MEGARED OMEGA-3 KRILL OIL PO Take by mouth daily.      rosuvastatin  (CRESTOR ) 10 MG tablet TAKE 1 TABLET BY MOUTH EVERY DAY 90 tablet 3   tirzepatide  (MOUNJARO ) 7.5 MG/0.5ML Pen Inject 7.5 mg into the skin once a week. 6 mL 2   VIT B12-METHIONINE-INOS-CHOL IM daily.      No current  facility-administered medications on file prior to visit.    Review of Systems  Constitutional:  Negative for chills and fever.  Respiratory:  Negative for cough.   Cardiovascular:  Negative for chest pain and palpitations.  Gastrointestinal:  Negative for nausea and vomiting.      Objective:    BP 110/62   Pulse 100   Temp 98.6 F (37 C) (Oral)   Ht 5' 8 (1.727 m)   Wt 184 lb 6.4 oz (83.6 kg)   SpO2 98%   BMI 28.04 kg/m  BP Readings from Last 3 Encounters:  11/13/23 110/62  11/12/23 120/82  10/28/23 107/71   Wt Readings from Last 3 Encounters:  11/13/23 184 lb 6.4 oz (83.6 kg)  11/12/23 184 lb 12.8 oz (83.8 kg)  10/28/23 187 lb (84.8 kg)    Physical Exam Vitals reviewed.  Constitutional:      Appearance: She is well-developed.  Eyes:     Conjunctiva/sclera: Conjunctivae normal.  Cardiovascular:     Rate and Rhythm: Normal rate and regular rhythm.     Pulses: Normal pulses.     Heart sounds: Normal heart sounds.  Pulmonary:     Effort: Pulmonary effort is normal.     Breath sounds: Normal breath sounds. No wheezing, rhonchi or rales.  Skin:    General: Skin is warm and dry.  Neurological:     Mental Status: She is alert.  Psychiatric:        Speech: Speech normal.        Behavior: Behavior normal.        Thought Content: Thought content normal.

## 2023-11-13 NOTE — Assessment & Plan Note (Signed)
 Excellent glycemic control on Mounjaro  7.5 mg.  Continue medication for glycemic control, weight maintenance.

## 2023-11-20 ENCOUNTER — Encounter: Payer: Self-pay | Admitting: Obstetrics and Gynecology

## 2023-11-22 ENCOUNTER — Other Ambulatory Visit: Payer: Self-pay | Admitting: Obstetrics and Gynecology

## 2023-11-22 MED ORDER — TOLTERODINE TARTRATE ER 4 MG PO CP24: 4.0000 mg | ORAL_CAPSULE | Freq: Every day | ORAL | 3 refills | Status: AC

## 2023-11-22 NOTE — Progress Notes (Signed)
 Rx detrol for OAB/urge incont

## 2023-12-11 ENCOUNTER — Other Ambulatory Visit (HOSPITAL_COMMUNITY): Payer: Self-pay

## 2023-12-18 DIAGNOSIS — G510 Bell's palsy: Secondary | ICD-10-CM | POA: Diagnosis not present

## 2023-12-18 DIAGNOSIS — G5133 Clonic hemifacial spasm, bilateral: Secondary | ICD-10-CM | POA: Diagnosis not present

## 2023-12-18 DIAGNOSIS — G245 Blepharospasm: Secondary | ICD-10-CM | POA: Diagnosis not present

## 2024-01-10 ENCOUNTER — Telehealth: Payer: Self-pay

## 2024-01-10 ENCOUNTER — Other Ambulatory Visit (HOSPITAL_COMMUNITY): Payer: Self-pay

## 2024-01-10 NOTE — Telephone Encounter (Signed)
 Pharmacy Patient Advocate Encounter   Received notification from Onbase that prior authorization for Mounjaro  7.5MG /0.5ML auto-injectors is required/requested.   Insurance verification completed.   The patient is insured through [facility].   Per test claim: PA required; PA submitted to above mentioned insurance via Latent Key/confirmation #/EOC [id number] Status is pending

## 2024-01-13 ENCOUNTER — Other Ambulatory Visit (HOSPITAL_COMMUNITY): Payer: Self-pay

## 2024-01-13 NOTE — Telephone Encounter (Signed)
 Pharmacy Patient Advocate Encounter  Received notification from [employer] that Prior Authorization for Mounjaro  7.5MG /0.5ML auto-injectors  has been APPROVED from 01/10/24 to 01/09/25. Ran test claim, Copay is $25.00. This test claim was processed through [facility]- copay amounts may vary at other pharmacies due to pharmacy/plan contracts, or as the patient moves through the different stages of their insurance plan.   PA #/Case ID/Reference #: [id number]

## 2024-02-10 ENCOUNTER — Encounter: Payer: Self-pay | Admitting: Family

## 2024-02-12 ENCOUNTER — Ambulatory Visit: Admitting: Family

## 2024-02-12 ENCOUNTER — Other Ambulatory Visit: Payer: Self-pay | Admitting: Family

## 2024-02-12 DIAGNOSIS — E663 Overweight: Secondary | ICD-10-CM

## 2024-02-12 DIAGNOSIS — Z8639 Personal history of other endocrine, nutritional and metabolic disease: Secondary | ICD-10-CM

## 2024-02-12 MED ORDER — TIRZEPATIDE 10 MG/0.5ML ~~LOC~~ SOAJ: 10.0000 mg | SUBCUTANEOUS | 3 refills | Status: AC

## 2024-03-10 ENCOUNTER — Ambulatory Visit: Admitting: Family

## 2024-11-11 ENCOUNTER — Inpatient Hospital Stay: Admitting: Internal Medicine

## 2024-11-11 ENCOUNTER — Inpatient Hospital Stay
